# Patient Record
Sex: Male | Born: 1935 | Race: White | Hispanic: No | Marital: Married | Smoking: Never smoker
Health system: Southern US, Community
[De-identification: ages and names within clinical notes are randomized; demographics above are authoritative.]

## PROBLEM LIST (undated history)

## (undated) DIAGNOSIS — Z951 Presence of aortocoronary bypass graft: Secondary | ICD-10-CM

## (undated) DIAGNOSIS — E785 Hyperlipidemia, unspecified: Secondary | ICD-10-CM

## (undated) DIAGNOSIS — R001 Bradycardia, unspecified: Secondary | ICD-10-CM

## (undated) DIAGNOSIS — L309 Dermatitis, unspecified: Secondary | ICD-10-CM

## (undated) DIAGNOSIS — R634 Abnormal weight loss: Secondary | ICD-10-CM

## (undated) DIAGNOSIS — D649 Anemia, unspecified: Secondary | ICD-10-CM

## (undated) DIAGNOSIS — C61 Malignant neoplasm of prostate: Secondary | ICD-10-CM

## (undated) DIAGNOSIS — E039 Hypothyroidism, unspecified: Secondary | ICD-10-CM

## (undated) DIAGNOSIS — M1990 Unspecified osteoarthritis, unspecified site: Secondary | ICD-10-CM

## (undated) DIAGNOSIS — D7589 Other specified diseases of blood and blood-forming organs: Secondary | ICD-10-CM

## (undated) DIAGNOSIS — I2510 Atherosclerotic heart disease of native coronary artery without angina pectoris: Secondary | ICD-10-CM

## (undated) DIAGNOSIS — M8580 Other specified disorders of bone density and structure, unspecified site: Secondary | ICD-10-CM

## (undated) HISTORY — PX: APPENDECTOMY: SHX54

## (undated) HISTORY — DX: Presence of aortocoronary bypass graft: Z95.1

## (undated) HISTORY — DX: Hyperlipidemia, unspecified: E78.5

## (undated) HISTORY — DX: Unspecified osteoarthritis, unspecified site: M19.90

## (undated) HISTORY — PX: PROSTATECTOMY: SHX69

## (undated) HISTORY — DX: Abnormal weight loss: R63.4

## (undated) HISTORY — DX: Other specified disorders of bone density and structure, unspecified site: M85.80

## (undated) HISTORY — DX: Anemia, unspecified: D64.9

## (undated) HISTORY — DX: Atherosclerotic heart disease of native coronary artery without angina pectoris: I25.10

## (undated) HISTORY — DX: Other specified diseases of blood and blood-forming organs: D75.89

## (undated) HISTORY — DX: Malignant neoplasm of prostate: C61

## (undated) HISTORY — PX: HERNIA REPAIR: SHX51

## (undated) HISTORY — DX: Bradycardia, unspecified: R00.1

## (undated) HISTORY — DX: Dermatitis, unspecified: L30.9

## (undated) HISTORY — DX: Hypothyroidism, unspecified: E03.9

---

## 1980-08-28 HISTORY — PX: CHOLECYSTECTOMY: SHX55

## 1996-08-28 HISTORY — PX: CORONARY ARTERY BYPASS GRAFT: SHX141

## 1999-08-02 ENCOUNTER — Other Ambulatory Visit: Admission: RE | Admit: 1999-08-02 | Discharge: 1999-08-02 | Payer: Self-pay | Admitting: Urology

## 1999-08-17 ENCOUNTER — Inpatient Hospital Stay (HOSPITAL_COMMUNITY): Admission: RE | Admit: 1999-08-17 | Discharge: 1999-08-20 | Payer: Self-pay | Admitting: Urology

## 1999-08-17 ENCOUNTER — Encounter (INDEPENDENT_AMBULATORY_CARE_PROVIDER_SITE_OTHER): Payer: Self-pay

## 1999-08-30 ENCOUNTER — Ambulatory Visit (HOSPITAL_COMMUNITY): Admission: RE | Admit: 1999-08-30 | Discharge: 1999-08-30 | Payer: Self-pay | Admitting: Urology

## 2003-03-09 ENCOUNTER — Ambulatory Visit (HOSPITAL_COMMUNITY): Admission: RE | Admit: 2003-03-09 | Discharge: 2003-03-09 | Payer: Self-pay | Admitting: Gastroenterology

## 2004-12-28 ENCOUNTER — Ambulatory Visit: Payer: Self-pay | Admitting: Oncology

## 2005-01-27 ENCOUNTER — Encounter (INDEPENDENT_AMBULATORY_CARE_PROVIDER_SITE_OTHER): Payer: Self-pay | Admitting: *Deleted

## 2005-01-27 ENCOUNTER — Other Ambulatory Visit: Admission: RE | Admit: 2005-01-27 | Discharge: 2005-01-27 | Payer: Self-pay | Admitting: Oncology

## 2005-08-07 ENCOUNTER — Ambulatory Visit: Payer: Self-pay | Admitting: Oncology

## 2006-08-02 ENCOUNTER — Ambulatory Visit: Payer: Self-pay | Admitting: Oncology

## 2006-08-06 LAB — CBC WITH DIFFERENTIAL/PLATELET
BASO%: 0.5 % (ref 0.0–2.0)
Basophils Absolute: 0 10*3/uL (ref 0.0–0.1)
EOS%: 3.6 % (ref 0.0–7.0)
Eosinophils Absolute: 0.1 10*3/uL (ref 0.0–0.5)
HCT: 38.2 % — ABNORMAL LOW (ref 38.7–49.9)
HGB: 13.1 g/dL (ref 13.0–17.1)
LYMPH%: 34.5 % (ref 14.0–48.0)
MCH: 35.3 pg — ABNORMAL HIGH (ref 28.0–33.4)
MCHC: 34.2 g/dL (ref 32.0–35.9)
MCV: 103.1 fL — ABNORMAL HIGH (ref 81.6–98.0)
MONO#: 0.5 10*3/uL (ref 0.1–0.9)
MONO%: 10.9 % (ref 0.0–13.0)
NEUT#: 2.1 10*3/uL (ref 1.5–6.5)
NEUT%: 50.5 % (ref 40.0–75.0)
Platelets: 234 10*3/uL (ref 145–400)
RBC: 3.71 10*6/uL — ABNORMAL LOW (ref 4.20–5.71)
RDW: 12 % (ref 11.2–14.6)
WBC: 4.2 10*3/uL (ref 4.0–10.0)
lymph#: 1.4 10*3/uL (ref 0.9–3.3)

## 2007-08-01 ENCOUNTER — Ambulatory Visit: Payer: Self-pay | Admitting: Oncology

## 2007-08-05 LAB — CBC WITH DIFFERENTIAL/PLATELET
BASO%: 0.5 % (ref 0.0–2.0)
Basophils Absolute: 0 10*3/uL (ref 0.0–0.1)
EOS%: 2.7 % (ref 0.0–7.0)
Eosinophils Absolute: 0.1 10*3/uL (ref 0.0–0.5)
HCT: 36.8 % — ABNORMAL LOW (ref 38.7–49.9)
HGB: 13 g/dL (ref 13.0–17.1)
LYMPH%: 28.4 % (ref 14.0–48.0)
MCH: 35.6 pg — ABNORMAL HIGH (ref 28.0–33.4)
MCHC: 35.3 g/dL (ref 32.0–35.9)
MCV: 101.1 fL — ABNORMAL HIGH (ref 81.6–98.0)
MONO#: 0.5 10*3/uL (ref 0.1–0.9)
MONO%: 10 % (ref 0.0–13.0)
NEUT#: 3 10*3/uL (ref 1.5–6.5)
NEUT%: 58.4 % (ref 40.0–75.0)
Platelets: 214 10*3/uL (ref 145–400)
RBC: 3.64 10*6/uL — ABNORMAL LOW (ref 4.20–5.71)
RDW: 12.3 % (ref 11.2–14.6)
WBC: 5.1 10*3/uL (ref 4.0–10.0)
lymph#: 1.5 10*3/uL (ref 0.9–3.3)

## 2008-07-30 ENCOUNTER — Ambulatory Visit: Payer: Self-pay | Admitting: Oncology

## 2009-08-02 ENCOUNTER — Ambulatory Visit: Payer: Self-pay | Admitting: Oncology

## 2009-08-05 LAB — CBC WITH DIFFERENTIAL/PLATELET
BASO%: 0.7 % (ref 0.0–2.0)
Basophils Absolute: 0 10*3/uL (ref 0.0–0.1)
EOS%: 6 % (ref 0.0–7.0)
Eosinophils Absolute: 0.2 10*3/uL (ref 0.0–0.5)
HCT: 36.5 % — ABNORMAL LOW (ref 38.4–49.9)
HGB: 12.7 g/dL — ABNORMAL LOW (ref 13.0–17.1)
LYMPH%: 40 % (ref 14.0–49.0)
MCH: 37.1 pg — ABNORMAL HIGH (ref 27.2–33.4)
MCHC: 34.7 g/dL (ref 32.0–36.0)
MCV: 106.9 fL — ABNORMAL HIGH (ref 79.3–98.0)
MONO#: 0.4 10*3/uL (ref 0.1–0.9)
MONO%: 10.7 % (ref 0.0–14.0)
NEUT#: 1.6 10*3/uL (ref 1.5–6.5)
NEUT%: 42.6 % (ref 39.0–75.0)
Platelets: 178 10*3/uL (ref 140–400)
RBC: 3.42 10*6/uL — ABNORMAL LOW (ref 4.20–5.82)
RDW: 12.2 % (ref 11.0–14.6)
WBC: 3.8 10*3/uL — ABNORMAL LOW (ref 4.0–10.3)
lymph#: 1.5 10*3/uL (ref 0.9–3.3)

## 2010-08-03 ENCOUNTER — Ambulatory Visit: Payer: Self-pay | Admitting: Oncology

## 2010-08-05 LAB — CBC WITH DIFFERENTIAL/PLATELET
BASO%: 0.7 % (ref 0.0–2.0)
Basophils Absolute: 0 10*3/uL (ref 0.0–0.1)
EOS%: 2.9 % (ref 0.0–7.0)
Eosinophils Absolute: 0.1 10*3/uL (ref 0.0–0.5)
HCT: 39 % (ref 38.4–49.9)
HGB: 13.3 g/dL (ref 13.0–17.1)
LYMPH%: 42 % (ref 14.0–49.0)
MCH: 36 pg — ABNORMAL HIGH (ref 27.2–33.4)
MCHC: 34.1 g/dL (ref 32.0–36.0)
MCV: 105.7 fL — ABNORMAL HIGH (ref 79.3–98.0)
MONO#: 0.5 10*3/uL (ref 0.1–0.9)
MONO%: 11.2 % (ref 0.0–14.0)
NEUT#: 1.8 10*3/uL (ref 1.5–6.5)
NEUT%: 43.2 % (ref 39.0–75.0)
Platelets: 185 10*3/uL (ref 140–400)
RBC: 3.69 10*6/uL — ABNORMAL LOW (ref 4.20–5.82)
RDW: 12.2 % (ref 11.0–14.6)
WBC: 4.2 10*3/uL (ref 4.0–10.3)
lymph#: 1.8 10*3/uL (ref 0.9–3.3)

## 2011-01-13 NOTE — Op Note (Signed)
   NAME:  Joseph Werner, Joseph Werner                       ACCOUNT NO.:  [id number]   MEDICAL RECORD NO.:  [id number]                   PATIENT TYPE:  AMB   LOCATION:  ENDO                                 FACILITY:  [facility]   PHYSICIAN:  Petra Kuba, M.D.                 DATE OF BIRTH:  15-Aug-1935   DATE OF PROCEDURE:  03/09/2003  DATE OF DISCHARGE:                                 OPERATIVE REPORT   PROCEDURE PERFORMED:  Colonoscopy   INDICATIONS FOR PROCEDURE:  Anemia in a patient due to colonic screening.   INFORMED CONSENT:  Consent was signed after risks, benefits, methods and  options were thoroughly discussed in the office.   MEDICINES USED:  Demerol 40, Versed 4.   DESCRIPTION OF PROCEDURE:  Rectal inspection was pertinent for external  hemorrhoids, small.  Digital exam was negative.  Video pediatric adjustable  colonoscope was inserted and advanced from the colon to the cecum.  This did  require some abdominal pressure.  No position changes.  On insertion some  occasional left sided diverticula were seen but no other abnormalities.  The  cecum was identified by the appendiceal orifice and the ileocecal valve.  In  fact, the scope was inserted a short ways into the terminal ileum which was  normal.  Photo documentation was obtained.  The scope was slowly withdrawn.  Prep was adequate.  There was some liquid stool that required washing and  suctioning.  On slow withdraw through the colon, other than some occasional  left sided diverticula, no abnormalities were seen.  Once back in the  rectum, anorectal pull through and retroflexion confirmed some internal  hemorrhoids with some anal papillae.  Scope was straightened, air was  suctioned and the scope removed.  The patient tolerated the procedure well.  There was no obvious immediate complication.   ENDOSCOPIC DIAGNOSES:  1. Internal and external hemorrhoids with some anal papillae.  2. Occasional left sided diverticula.  3.  Otherwise within normal limits to the terminal ileum.   PLAN:  Consider repeat screening in five years.  Happy to see back p.r.n.,  otherwise return care to Dr. Clarene Duke for continued anemia workup to include  probable repeat CBC, consider B12 and folate levels and possibly even  hematology consult unless iron deficient, despite increased MCV and then  would check upper tract one time.                                               Petra Kuba, M.D.   MEM/MEDQ  D:  03/09/2003  T:  03/09/2003  Job:  [id number]   cc:   Caryn Bee L. Little, M.D.  [address]  [place]  [place] [address]  Fax: [phone]

## 2011-06-15 ENCOUNTER — Encounter (HOSPITAL_COMMUNITY): Payer: Medicare Other | Attending: Family Medicine

## 2011-06-15 DIAGNOSIS — M810 Age-related osteoporosis without current pathological fracture: Secondary | ICD-10-CM | POA: Insufficient documentation

## 2011-07-28 ENCOUNTER — Other Ambulatory Visit: Payer: Self-pay | Admitting: *Deleted

## 2011-07-29 ENCOUNTER — Telehealth: Payer: Self-pay | Admitting: Oncology

## 2011-07-29 NOTE — Telephone Encounter (Signed)
S/w the pt and he is aware of his r/s dec appts to [facility] 2013

## 2011-08-03 ENCOUNTER — Other Ambulatory Visit: Payer: Medicare Other | Admitting: Lab

## 2011-08-03 ENCOUNTER — Ambulatory Visit: Payer: Medicare Other | Admitting: Oncology

## 2011-09-07 ENCOUNTER — Other Ambulatory Visit: Payer: Self-pay | Admitting: *Deleted

## 2011-09-14 ENCOUNTER — Other Ambulatory Visit: Payer: Self-pay | Admitting: *Deleted

## 2011-09-14 ENCOUNTER — Encounter: Payer: Self-pay | Admitting: *Deleted

## 2011-09-14 DIAGNOSIS — D649 Anemia, unspecified: Secondary | ICD-10-CM

## 2011-09-15 ENCOUNTER — Ambulatory Visit (HOSPITAL_BASED_OUTPATIENT_CLINIC_OR_DEPARTMENT_OTHER): Payer: Medicare Other | Admitting: Nurse Practitioner

## 2011-09-15 ENCOUNTER — Telehealth: Payer: Self-pay | Admitting: Oncology

## 2011-09-15 ENCOUNTER — Other Ambulatory Visit (HOSPITAL_BASED_OUTPATIENT_CLINIC_OR_DEPARTMENT_OTHER): Payer: Medicare Other | Admitting: Lab

## 2011-09-15 VITALS — BP 134/73 | HR 42 | Temp 96.6°F | Ht 71.0 in | Wt 188.8 lb

## 2011-09-15 DIAGNOSIS — D7589 Other specified diseases of blood and blood-forming organs: Secondary | ICD-10-CM | POA: Diagnosis not present

## 2011-09-15 DIAGNOSIS — D72819 Decreased white blood cell count, unspecified: Secondary | ICD-10-CM | POA: Diagnosis not present

## 2011-09-15 LAB — CBC WITH DIFFERENTIAL/PLATELET
BASO%: 0.8 % (ref 0.0–2.0)
Basophils Absolute: 0 10*3/uL (ref 0.0–0.1)
EOS%: 3 % (ref 0.0–7.0)
Eosinophils Absolute: 0.1 10*3/uL (ref 0.0–0.5)
HCT: 39.3 % (ref 38.4–49.9)
HGB: 13.4 g/dL (ref 13.0–17.1)
LYMPH%: 35.6 % (ref 14.0–49.0)
MCH: 36.2 pg — ABNORMAL HIGH (ref 27.2–33.4)
MCHC: 34.3 g/dL (ref 32.0–36.0)
MCV: 105.6 fL — ABNORMAL HIGH (ref 79.3–98.0)
MONO#: 0.4 10*3/uL (ref 0.1–0.9)
MONO%: 10.7 % (ref 0.0–14.0)
NEUT#: 1.7 10*3/uL (ref 1.5–6.5)
NEUT%: 49.9 % (ref 39.0–75.0)
Platelets: 176 10*3/uL (ref 140–400)
RBC: 3.72 10*6/uL — ABNORMAL LOW (ref 4.20–5.82)
RDW: 12.1 % (ref 11.0–14.6)
WBC: 3.5 10*3/uL — ABNORMAL LOW (ref 4.0–10.3)
lymph#: 1.2 10*3/uL (ref 0.9–3.3)

## 2011-09-15 NOTE — Progress Notes (Signed)
OFFICE PROGRESS NOTE  Interval history:  [name] is a 77 year old man with chronic red cell macrocytosis. He is seen today for scheduled followup.  [name] reports that he feels well. No interim illnesses or infections. He has a good appetite and good energy level. No shortness of breath. No fevers or sweats.   Objective: Blood pressure 134/73, pulse 42, temperature 96.6 F (35.9 C), temperature source Oral, height 5\' 11"  (1.803 m), weight 188 lb 12.8 oz (85.639 kg).  Oropharynx is without thrush or ulceration. No palpable cervical, supraclavicular or axillary lymph nodes. Lungs are clear. No wheezes or rales. Regular cardiac rhythm. Abdomen is soft and nontender. No organomegaly. Extremities are without edema.  Lab Results: Lab Results  Component Value Date   WBC 3.5* 09/15/2011   HGB 13.4 09/15/2011   HCT 39.3 09/15/2011   MCV 105.6* 09/15/2011   PLT 176 09/15/2011    Chemistry:    Chemistry   No results found for this basename: NA, K, CL, CO2, BUN, CREATININE, GLU   No results found for this basename: CALCIUM, ALKPHOS, AST, ALT, BILITOT       Studies/Results: No results found.  Medications: I have reviewed the patient's current medications.  Assessment/Plan:  1. 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 A bone marrow biopsy in 2006 was nondiagnostic. 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 2. Chronic bradycardia. 3. Borderline low white count - stable.  Disposition-[name] appears stable from a hematologic standpoint. He will return for an office visit and CBC in one year. He will contact the office the interim with any problems.  Plan reviewed with Dr. Derenda Fennel, Misty Stanley ANP/GNP-BC

## 2011-09-15 NOTE — Telephone Encounter (Signed)
Gv pt appt for jan2014 °

## 2011-10-05 DIAGNOSIS — Z23 Encounter for immunization: Secondary | ICD-10-CM | POA: Diagnosis not present

## 2011-12-11 DIAGNOSIS — R05 Cough: Secondary | ICD-10-CM | POA: Diagnosis not present

## 2011-12-11 DIAGNOSIS — R059 Cough, unspecified: Secondary | ICD-10-CM | POA: Diagnosis not present

## 2011-12-11 DIAGNOSIS — J309 Allergic rhinitis, unspecified: Secondary | ICD-10-CM | POA: Diagnosis not present

## 2012-01-12 DIAGNOSIS — Z951 Presence of aortocoronary bypass graft: Secondary | ICD-10-CM | POA: Diagnosis not present

## 2012-01-12 DIAGNOSIS — E785 Hyperlipidemia, unspecified: Secondary | ICD-10-CM | POA: Diagnosis not present

## 2012-01-12 DIAGNOSIS — Z0000 Encounter for general adult medical examination without abnormal findings: Secondary | ICD-10-CM | POA: Diagnosis not present

## 2012-01-12 DIAGNOSIS — E039 Hypothyroidism, unspecified: Secondary | ICD-10-CM | POA: Diagnosis not present

## 2012-01-12 DIAGNOSIS — I2510 Atherosclerotic heart disease of native coronary artery without angina pectoris: Secondary | ICD-10-CM | POA: Diagnosis not present

## 2012-01-12 DIAGNOSIS — Z79899 Other long term (current) drug therapy: Secondary | ICD-10-CM | POA: Diagnosis not present

## 2012-02-14 DIAGNOSIS — Z951 Presence of aortocoronary bypass graft: Secondary | ICD-10-CM | POA: Diagnosis not present

## 2012-02-14 DIAGNOSIS — I2510 Atherosclerotic heart disease of native coronary artery without angina pectoris: Secondary | ICD-10-CM | POA: Diagnosis not present

## 2012-02-14 DIAGNOSIS — E7800 Pure hypercholesterolemia, unspecified: Secondary | ICD-10-CM | POA: Diagnosis not present

## 2012-04-18 DIAGNOSIS — Z79899 Other long term (current) drug therapy: Secondary | ICD-10-CM | POA: Diagnosis not present

## 2012-04-18 DIAGNOSIS — L2089 Other atopic dermatitis: Secondary | ICD-10-CM | POA: Diagnosis not present

## 2012-04-18 DIAGNOSIS — E039 Hypothyroidism, unspecified: Secondary | ICD-10-CM | POA: Diagnosis not present

## 2012-04-18 DIAGNOSIS — M899 Disorder of bone, unspecified: Secondary | ICD-10-CM | POA: Diagnosis not present

## 2012-04-18 DIAGNOSIS — E785 Hyperlipidemia, unspecified: Secondary | ICD-10-CM | POA: Diagnosis not present

## 2012-04-18 DIAGNOSIS — Z23 Encounter for immunization: Secondary | ICD-10-CM | POA: Diagnosis not present

## 2012-04-18 DIAGNOSIS — Z0000 Encounter for general adult medical examination without abnormal findings: Secondary | ICD-10-CM | POA: Diagnosis not present

## 2012-04-18 DIAGNOSIS — I2510 Atherosclerotic heart disease of native coronary artery without angina pectoris: Secondary | ICD-10-CM | POA: Diagnosis not present

## 2012-04-18 DIAGNOSIS — Z8546 Personal history of malignant neoplasm of prostate: Secondary | ICD-10-CM | POA: Diagnosis not present

## 2012-04-18 DIAGNOSIS — M949 Disorder of cartilage, unspecified: Secondary | ICD-10-CM | POA: Diagnosis not present

## 2012-05-03 DIAGNOSIS — M722 Plantar fascial fibromatosis: Secondary | ICD-10-CM | POA: Diagnosis not present

## 2012-05-03 DIAGNOSIS — M79609 Pain in unspecified limb: Secondary | ICD-10-CM | POA: Diagnosis not present

## 2012-05-09 DIAGNOSIS — Z23 Encounter for immunization: Secondary | ICD-10-CM | POA: Diagnosis not present

## 2012-05-17 DIAGNOSIS — M722 Plantar fascial fibromatosis: Secondary | ICD-10-CM | POA: Diagnosis not present

## 2012-06-06 DIAGNOSIS — M949 Disorder of cartilage, unspecified: Secondary | ICD-10-CM | POA: Diagnosis not present

## 2012-06-06 DIAGNOSIS — E785 Hyperlipidemia, unspecified: Secondary | ICD-10-CM | POA: Diagnosis not present

## 2012-06-06 DIAGNOSIS — Z0000 Encounter for general adult medical examination without abnormal findings: Secondary | ICD-10-CM | POA: Diagnosis not present

## 2012-06-06 DIAGNOSIS — I2510 Atherosclerotic heart disease of native coronary artery without angina pectoris: Secondary | ICD-10-CM | POA: Diagnosis not present

## 2012-06-06 DIAGNOSIS — M899 Disorder of bone, unspecified: Secondary | ICD-10-CM | POA: Diagnosis not present

## 2012-06-06 DIAGNOSIS — C61 Malignant neoplasm of prostate: Secondary | ICD-10-CM | POA: Diagnosis not present

## 2012-06-06 DIAGNOSIS — Z79899 Other long term (current) drug therapy: Secondary | ICD-10-CM | POA: Diagnosis not present

## 2012-06-06 DIAGNOSIS — E039 Hypothyroidism, unspecified: Secondary | ICD-10-CM | POA: Diagnosis not present

## 2012-06-19 ENCOUNTER — Encounter (HOSPITAL_COMMUNITY): Payer: Self-pay

## 2012-06-19 ENCOUNTER — Encounter (HOSPITAL_COMMUNITY)
Admission: RE | Admit: 2012-06-19 | Discharge: 2012-06-19 | Disposition: A | Discharge status: Home / Self Care | Payer: Medicare Other | Source: Ambulatory Visit | Attending: Family Medicine | Admitting: Family Medicine

## 2012-06-19 DIAGNOSIS — M810 Age-related osteoporosis without current pathological fracture: Secondary | ICD-10-CM | POA: Insufficient documentation

## 2012-06-19 MED ORDER — ZOLEDRONIC ACID 5 MG/100ML IV SOLN
5.0000 mg | Freq: Once | INTRAVENOUS | Status: AC
  Administered 2012-06-19: 5 mg via INTRAVENOUS
  Filled 2012-06-19: qty 100

## 2012-06-19 MED ORDER — SODIUM CHLORIDE 0.9 % IV SOLN
Freq: Once | INTRAVENOUS | Status: AC
  Administered 2012-06-19: 14:00:00 via INTRAVENOUS

## 2012-09-13 ENCOUNTER — Ambulatory Visit (HOSPITAL_BASED_OUTPATIENT_CLINIC_OR_DEPARTMENT_OTHER): Payer: Medicare Other | Admitting: Oncology

## 2012-09-13 ENCOUNTER — Telehealth: Payer: Self-pay | Admitting: Oncology

## 2012-09-13 ENCOUNTER — Other Ambulatory Visit (HOSPITAL_BASED_OUTPATIENT_CLINIC_OR_DEPARTMENT_OTHER): Payer: Medicare Other | Admitting: Lab

## 2012-09-13 VITALS — BP 140/67 | HR 46 | Temp 97.4°F | Resp 20 | Ht 72.0 in | Wt 190.5 lb

## 2012-09-13 DIAGNOSIS — D7589 Other specified diseases of blood and blood-forming organs: Secondary | ICD-10-CM | POA: Diagnosis not present

## 2012-09-13 DIAGNOSIS — D649 Anemia, unspecified: Secondary | ICD-10-CM

## 2012-09-13 LAB — CBC WITH DIFFERENTIAL/PLATELET
BASO%: 0.4 % (ref 0.0–2.0)
Basophils Absolute: 0 10*3/uL (ref 0.0–0.1)
EOS%: 2.3 % (ref 0.0–7.0)
Eosinophils Absolute: 0.1 10*3/uL (ref 0.0–0.5)
HCT: 36.4 % — ABNORMAL LOW (ref 38.4–49.9)
HGB: 12.8 g/dL — ABNORMAL LOW (ref 13.0–17.1)
LYMPH%: 27.1 % (ref 14.0–49.0)
MCH: 36.9 pg — ABNORMAL HIGH (ref 27.2–33.4)
MCHC: 35.3 g/dL (ref 32.0–36.0)
MCV: 104.5 fL — ABNORMAL HIGH (ref 79.3–98.0)
MONO#: 0.6 10*3/uL (ref 0.1–0.9)
MONO%: 10 % (ref 0.0–14.0)
NEUT#: 3.3 10*3/uL (ref 1.5–6.5)
NEUT%: 60.2 % (ref 39.0–75.0)
Platelets: 158 10*3/uL (ref 140–400)
RBC: 3.48 10*6/uL — ABNORMAL LOW (ref 4.20–5.82)
RDW: 12.2 % (ref 11.0–14.6)
WBC: 5.5 10*3/uL (ref 4.0–10.3)
lymph#: 1.5 10*3/uL (ref 0.9–3.3)

## 2012-09-13 NOTE — Telephone Encounter (Signed)
gv and printed appt schedule for pt for jan 2015...the patient aware and ok

## 2012-09-13 NOTE — Progress Notes (Signed)
    [facility]    OFFICE PROGRESS NOTE   INTERVAL HISTORY:   He returns as scheduled. He feels well. Good energy level. He exercises 5 days per week. He has noted discomfort in the low abdomen/suprapubic area when lifting weights. No pain at other times.  Objective:  Vital signs in last 24 hours:  Blood pressure 140/67, pulse 46, temperature 97.4 F (36.3 C), temperature source Oral, resp. rate 20, height 6' (1.829 m), weight 190 lb 8 oz (86.41 kg).    HEENT: Neck without mass Lymphatics: No cervical, supraclavicular, or inguinal nodes. "Shotty "bilateral axillary nodes. Resp: Lungs clear bilaterally Cardio: Regular rate and rhythm GI: No hepatosplenic, no mass, ? Mild fullness in the right low abdomen near the pelvic brim.? Reducible hernia Vascular: No leg edema   Lab Results:  Lab Results  Component Value Date   WBC 5.5 09/13/2012   HGB 12.8* 09/13/2012   HCT 36.4* 09/13/2012   MCV 104.5* 09/13/2012   PLT 158 09/13/2012   ANC 3.3    Medications: I have reviewed the patient's current medications.  Assessment/Plan: 1. 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 A bone marrow biopsy in 2006 was nondiagnostic. 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 2. Chronic bradycardia. 3. History of a Borderline low white count - normal today 4. Low abdominal discomfort when lifting weights-? Hernia. He will see Dr. Clarene Duke if the discomfort persists   Disposition:  Joseph Werner is stable from a hematologic standpoint. He would like to continue followup in the hematology clinic. He will return for an office visit and CBC in one year.   Thornton Papas, MD  09/13/2012  9:55 AM

## 2012-10-03 DIAGNOSIS — R109 Unspecified abdominal pain: Secondary | ICD-10-CM | POA: Diagnosis not present

## 2012-10-04 ENCOUNTER — Other Ambulatory Visit: Payer: Self-pay | Admitting: Family Medicine

## 2012-10-04 DIAGNOSIS — R102 Pelvic and perineal pain: Secondary | ICD-10-CM

## 2012-10-08 ENCOUNTER — Ambulatory Visit
Admission: RE | Admit: 2012-10-08 | Discharge: 2012-10-08 | Disposition: A | Discharge status: Home / Self Care | Payer: Medicare Other | Source: Ambulatory Visit | Attending: Family Medicine | Admitting: Family Medicine

## 2012-10-08 DIAGNOSIS — K4090 Unilateral inguinal hernia, without obstruction or gangrene, not specified as recurrent: Secondary | ICD-10-CM | POA: Diagnosis not present

## 2012-10-08 DIAGNOSIS — R102 Pelvic and perineal pain: Secondary | ICD-10-CM

## 2012-10-08 DIAGNOSIS — R599 Enlarged lymph nodes, unspecified: Secondary | ICD-10-CM | POA: Diagnosis not present

## 2012-10-08 IMAGING — CT CT PELVIS W/O CM
1 of 3 series · 15 of 36 positions shown, 19 images · IV contrast (READICAT)
Comparison: None.

CLINICAL DATA: Suprapubic pain for 1 month.  Equation all right
lower quadrant pain.  History of prostate cancer with
prostatectomy.

CT PELVIS WITHOUT CONTRAST
TECHNIQUE: Multidetector CT imaging of the pelvis was performed
following the standard protocol without intravenous contrast.

[Series 2: routine pelvis · axial · 0.70mm/px · z∈[-160,-0]mm · 15 of 37 slices shown, 19 images]
[im 3/37  soft-tissue]
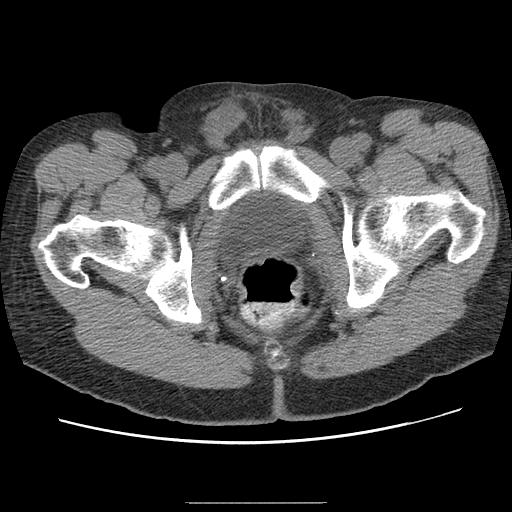
[im 3/37  bone]
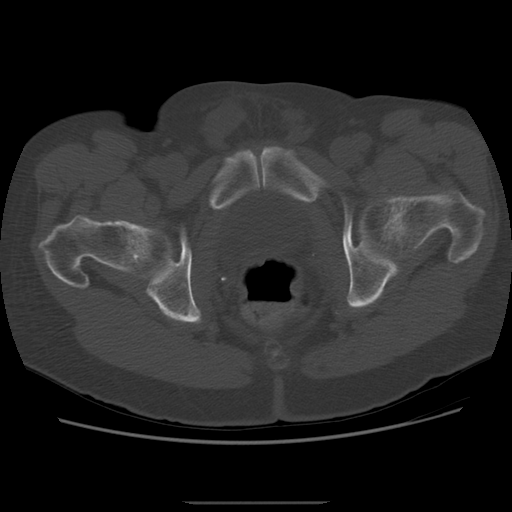
[im 5/37  soft-tissue]
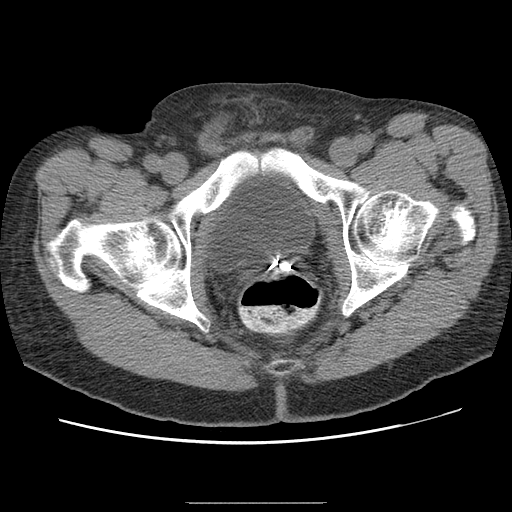
[im 7/37  soft-tissue]
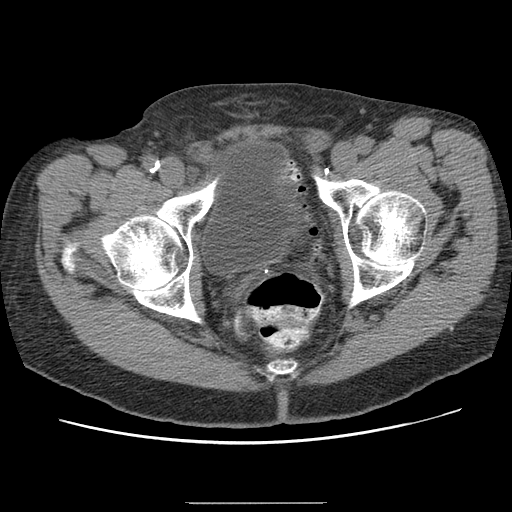
[im 11/37  soft-tissue]
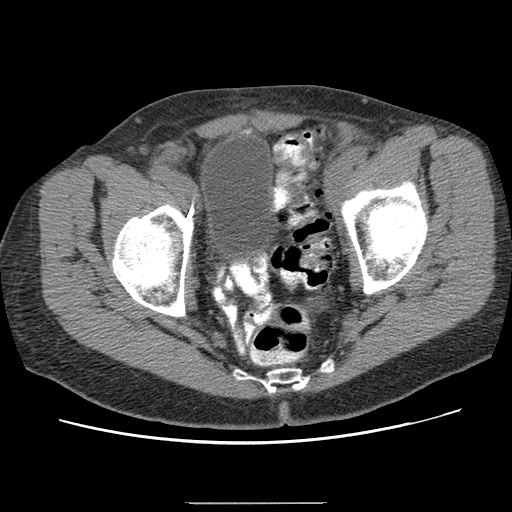
[im 13/37  soft-tissue]
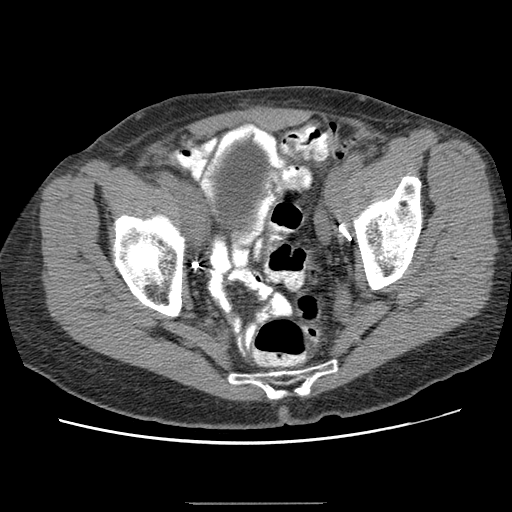
[im 16/37  soft-tissue]
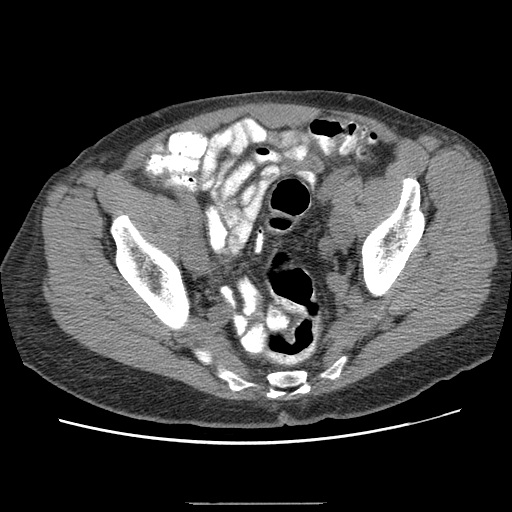
[im 19/37  soft-tissue]
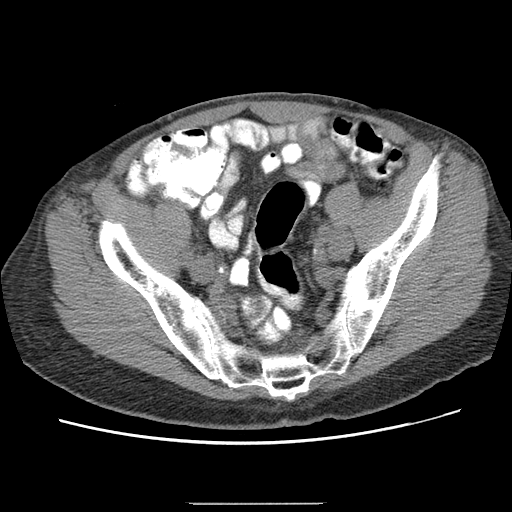
[im 21/37  soft-tissue]
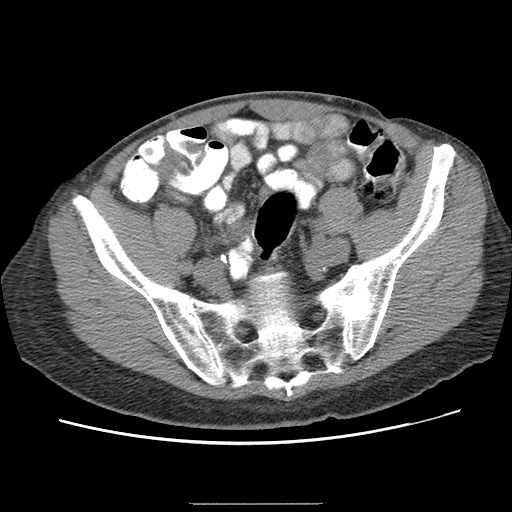
[im 24/37  soft-tissue]
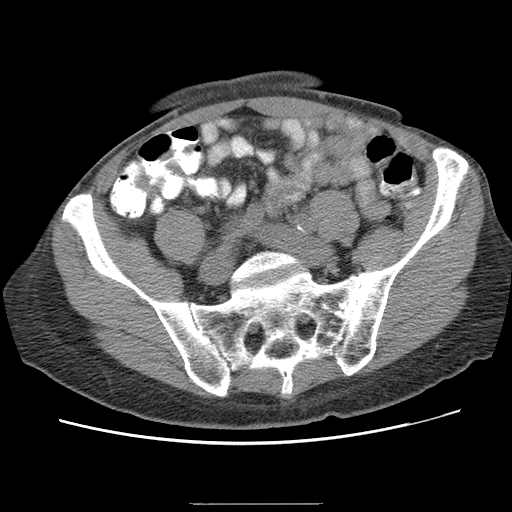
[im 24/37  bone]
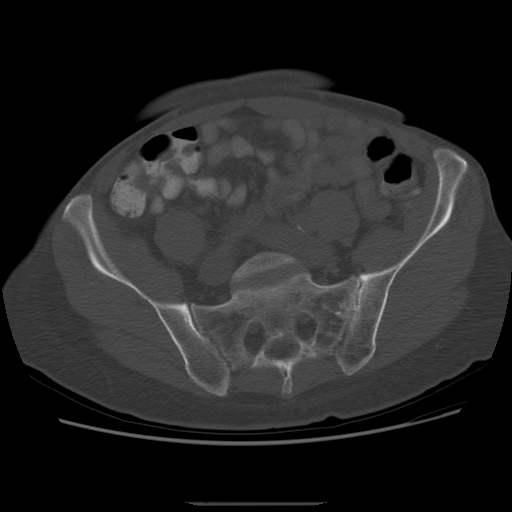
[im 26/37  soft-tissue]
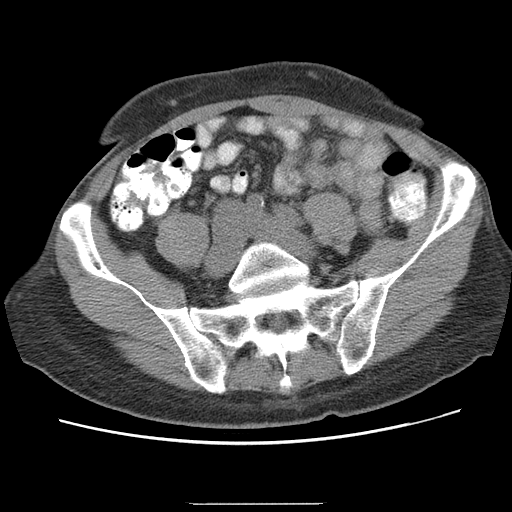
[im 30/37  soft-tissue]
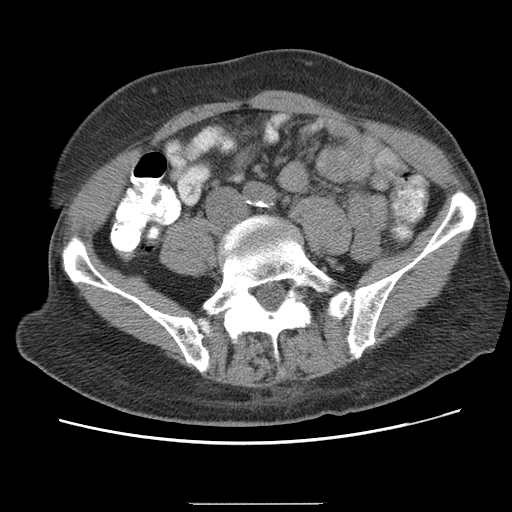
[im 32/37  soft-tissue]
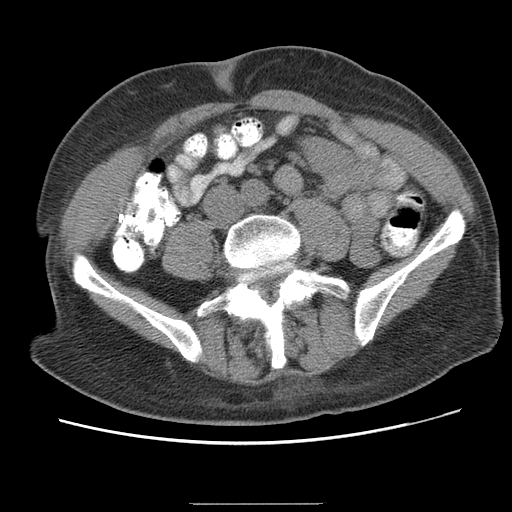
[im 32/37  lung]
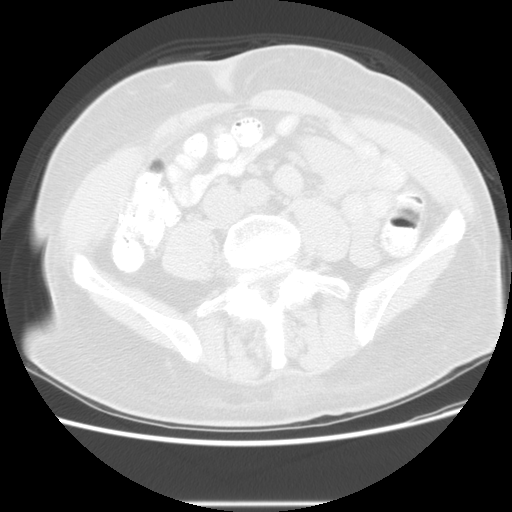
[im 33/37  lung]
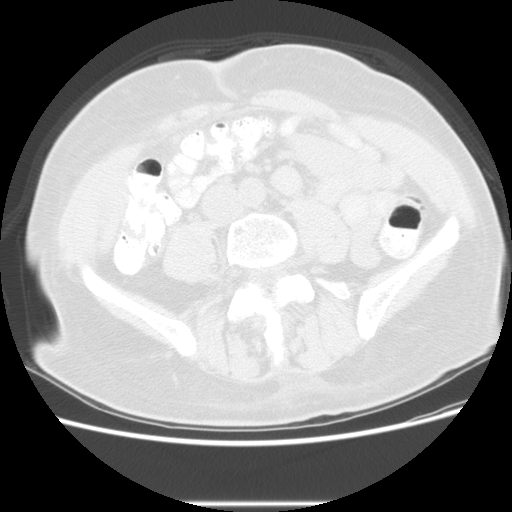
[im 34/37  soft-tissue]
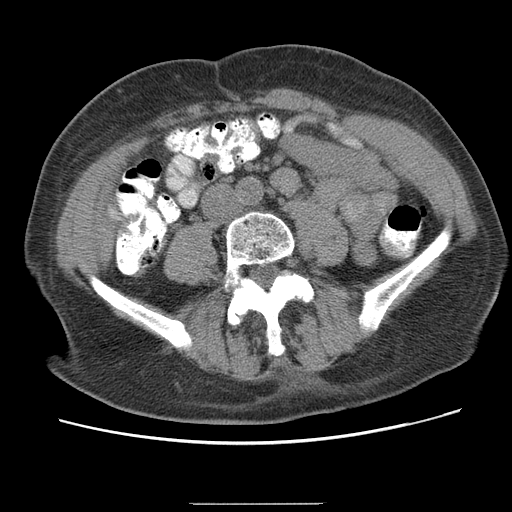
[im 34/37  lung]
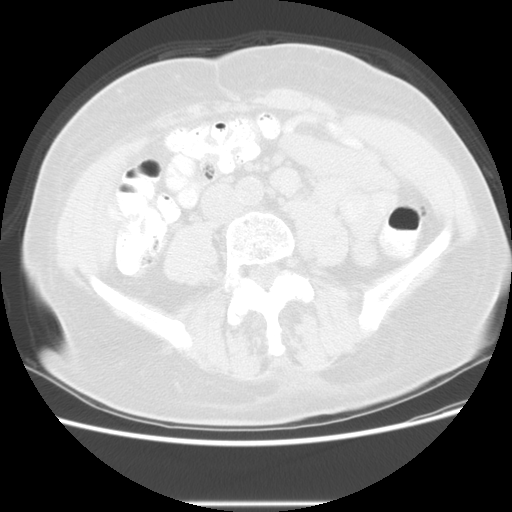
[im 35/37  lung]
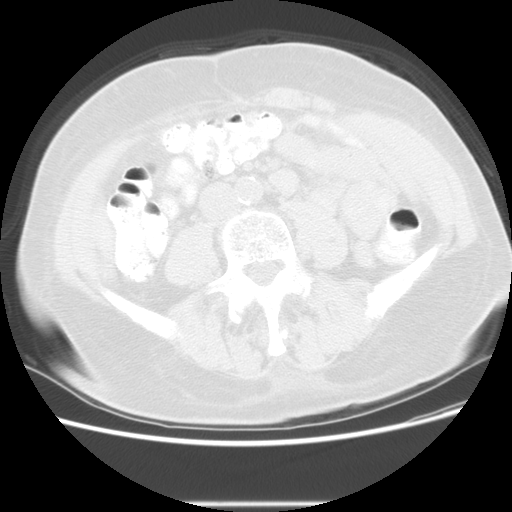

[15 of 36 positions shown; findings below may reference images not displayed]

FINDINGS: Oral contrast was used for the examination.  No IV
contrast is present.  Surgical clips along the pelvic side wall
compatible with pelvic lymphadenectomy.  Mild aortoiliac
atherosclerosis.  Small and large bowel appear within normal
limits.  Urinary bladder appears normal.  Postoperative changes of
prostatectomy.

There is fluid density in the inferior right inguinal canal.  This
probably represents cranial tracking of hydrocele in the right hemi
scrotum. Tiny fat containing right inguinal hernia is present.

No large hip effusion.  Hip girdle musculature appears within
normal limits.  Left inguinal canal appears normal.  No destructive
osseous lesions.  Partial ankylosis of the left sacroiliac joint,
probably degenerative.  The right SI joint remains open. Partially
visualized lumbar scoliosis and left greater than right L4-L5 and
right greater than left L5-S1 facet arthrosis.  Degenerative
changes in the spinous processes of L4 and L5 suggest Baastrup
impingement.
IMPRESSION: Tiny fat containing right inguinal hernia.  No herniated bowel.
Fluid in the right inguinal canal likely represent hydrocele
tracking cranially.  Postoperative changes of prostatectomy and
pelvic lymphadenectomy.

## 2012-10-18 ENCOUNTER — Encounter (INDEPENDENT_AMBULATORY_CARE_PROVIDER_SITE_OTHER): Payer: Self-pay

## 2012-10-21 ENCOUNTER — Ambulatory Visit (INDEPENDENT_AMBULATORY_CARE_PROVIDER_SITE_OTHER): Payer: Medicare Other | Admitting: General Surgery

## 2012-10-21 ENCOUNTER — Encounter (INDEPENDENT_AMBULATORY_CARE_PROVIDER_SITE_OTHER): Payer: Self-pay | Admitting: General Surgery

## 2012-10-21 VITALS — BP 146/96 | HR 60 | Temp 98.1°F | Resp 16 | Ht 71.0 in | Wt 192.6 lb

## 2012-10-21 DIAGNOSIS — K4090 Unilateral inguinal hernia, without obstruction or gangrene, not specified as recurrent: Secondary | ICD-10-CM | POA: Insufficient documentation

## 2012-10-21 NOTE — Patient Instructions (Signed)
Plan for right inguinal hernia repair with mesh 

## 2012-10-21 NOTE — Progress Notes (Signed)
Subjective:     Patient ID: Joseph Werner, male   DOB: [birth year], 78 y.o.   MRN: [id number]  HPI We are asked to see the patient in consultation by Dr. Clarene Duke to evaluate him for a right inguinal hernia. The patient is a 78 year old white male who works out and jogs on a treadmill everyday. Recently he's been experiencing some occasional pain in the right groin and suprapubic area. He denies any nausea or vomiting with it. His appetite has been good and his bowels work normally. He went to his medical doctor who ordered a CT of his pelvis. The CT did show a small right inguinal hernia. 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  Review of Systems  Constitutional: Negative.   HENT: Negative.   Eyes: Negative.   Respiratory: Negative.   Cardiovascular: Negative.   Gastrointestinal: Negative.   Endocrine: Negative.   Genitourinary: Negative.   Musculoskeletal: Negative.   Skin: Negative.   Allergic/Immunologic: Negative.   Neurological: Negative.   Hematological: Negative.   Psychiatric/Behavioral: Negative.        Objective:   Physical Exam  Constitutional: He is oriented to person, place, and time. He appears well-developed and well-nourished.  HENT:  Head: Normocephalic and atraumatic.  Eyes: Conjunctivae and EOM are normal. Pupils are equal, round, and reactive to light.  Neck: Normal range of motion. Neck supple.  Cardiovascular: Normal rate, regular rhythm and normal heart sounds.   Pulmonary/Chest: Effort normal and breath sounds normal.  Abdominal: Soft. Bowel sounds are normal.  Genitourinary:  There is no obvious bulge or impulse with straining in either groin.  Musculoskeletal: Normal range of motion.  Neurological: He is alert and oriented to person, place, and time.  Skin: Skin is warm and dry.  Psychiatric: He has a normal mood and affect. His behavior is normal.       Assessment:     The patient appears to  have a small but symptomatic right inguinal hernia. Because of the risk of incarceration strenuousness and could benefit from having this fixed. He would also like to have this done. I have discussed with him in detail the risks and benefits of the operation to fix the hernia as well as some of the technical aspects and he understands and wishes to proceed     Plan:     Plan for right inguinal hernia repair with mesh

## 2012-11-05 ENCOUNTER — Encounter (HOSPITAL_COMMUNITY): Payer: Self-pay | Admitting: Pharmacy Technician

## 2012-11-08 NOTE — Pre-Procedure Instructions (Signed)
KVEON CASANAS  11/08/2012   Your procedure is scheduled on:  Mon, Mar 24 @ 1:56 PM  Report to [facility] at 11:45 AM.  Call this number if you have problems the morning of surgery: [phone]   Remember:   Do not eat food or drink liquids after midnight.   Take these medicines the morning of surgery with A SIP OF WATER: Levothyroxine(Synthroid)              Stop taking your Aspirin,Fish Oil,Ibuprofen 5 days prior to surgery.No Goody's,BC's,Aleve,or Herbal Medications   Do not wear jewelry  Do not wear lotions, powders, or . You may wear deodorant.  Do not shave 48 hours prior to surgery. Men may shave face and neck.  Do not bring valuables to the hospital.  Contacts, dentures or bridgework may not be worn into surgery.  Leave suitcase in the car. After surgery it may be brought to your room.  For patients admitted to the hospital, checkout time is 11:00 AM the day of  discharge.   Patients discharged the day of surgery will not be allowed to drive  home.    Special Instructions: Shower using CHG 2 nights before surgery and the night before surgery.  If you shower the day of surgery use CHG.  Use special wash - you have one bottle of CHG for all showers.  You should use approximately 1/3 of the bottle for each shower.   Please read over the following fact sheets that you were given: Pain Booklet, Coughing and Deep Breathing, MRSA Information and Surgical Site Infection Prevention

## 2012-11-11 ENCOUNTER — Encounter (HOSPITAL_COMMUNITY)
Admission: RE | Admit: 2012-11-11 | Discharge: 2012-11-11 | Disposition: A | Discharge status: Home / Self Care | Payer: Medicare Other | Source: Ambulatory Visit | Attending: General Surgery | Admitting: General Surgery

## 2012-11-11 ENCOUNTER — Encounter (HOSPITAL_COMMUNITY): Payer: Self-pay

## 2012-11-11 ENCOUNTER — Ambulatory Visit (HOSPITAL_COMMUNITY)
Admission: RE | Admit: 2012-11-11 | Discharge: 2012-11-11 | Disposition: A | Discharge status: Home / Self Care | Payer: Medicare Other | Source: Ambulatory Visit | Attending: Anesthesiology | Admitting: Anesthesiology

## 2012-11-11 DIAGNOSIS — Z01812 Encounter for preprocedural laboratory examination: Secondary | ICD-10-CM | POA: Insufficient documentation

## 2012-11-11 DIAGNOSIS — Z01811 Encounter for preprocedural respiratory examination: Secondary | ICD-10-CM | POA: Diagnosis not present

## 2012-11-11 DIAGNOSIS — Z01818 Encounter for other preprocedural examination: Secondary | ICD-10-CM | POA: Insufficient documentation

## 2012-11-11 LAB — BASIC METABOLIC PANEL
BUN: 25 mg/dL — ABNORMAL HIGH (ref 6–23)
CO2: 29 mEq/L (ref 19–32)
Calcium: 9.6 mg/dL (ref 8.4–10.5)
Chloride: 108 mEq/L (ref 96–112)
Creatinine, Ser: 1.11 mg/dL (ref 0.50–1.35)
GFR calc Af Amer: 73 mL/min — ABNORMAL LOW (ref >90)
GFR calc non Af Amer: 63 mL/min — ABNORMAL LOW (ref >90)
Glucose, Bld: 95 mg/dL (ref 70–99)
Potassium: 4.4 mEq/L (ref 3.5–5.1)
Sodium: 143 mEq/L (ref 135–145)

## 2012-11-11 LAB — CBC
HCT: 41.2 % (ref 39.0–52.0)
Hemoglobin: 14.6 g/dL (ref 13.0–17.0)
MCH: 36 pg — ABNORMAL HIGH (ref 26.0–34.0)
MCHC: 35.4 g/dL (ref 30.0–36.0)
MCV: 101.5 fL — ABNORMAL HIGH (ref 78.0–100.0)
Platelets: 173 10*3/uL (ref 150–400)
RBC: 4.06 MIL/uL — ABNORMAL LOW (ref 4.22–5.81)
RDW: 11.3 % — ABNORMAL LOW (ref 11.5–15.5)
WBC: 9.6 10*3/uL (ref 4.0–10.5)

## 2012-11-11 LAB — SURGICAL PCR SCREEN
MRSA, PCR: NEGATIVE
Staphylococcus aureus: POSITIVE — AB

## 2012-11-11 IMAGING — CR DG CHEST 2V
2 series · 2 of 2 positions shown · non-contrast
Comparison: None.

CLINICAL DATA: Preadmission for hernia repair.

CHEST - 2 VIEW

[view not recorded (1 of 2)]
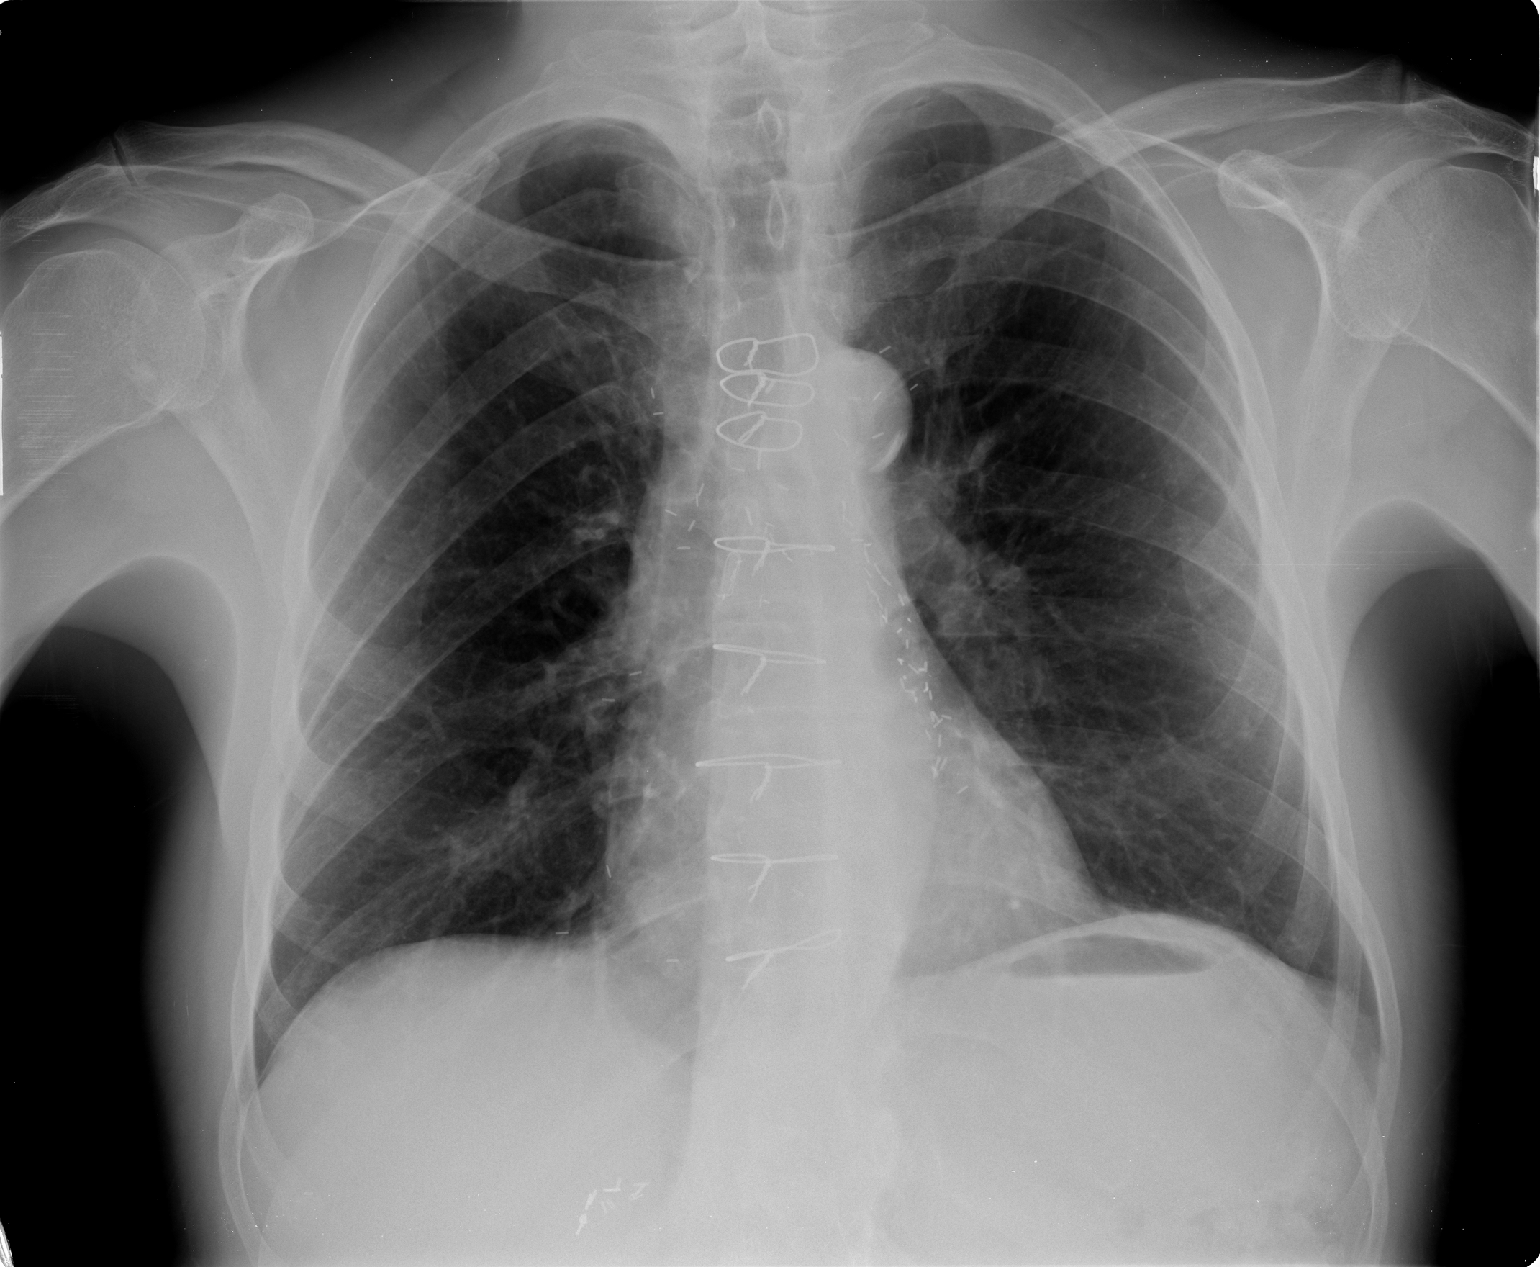

[view not recorded (2 of 2)]
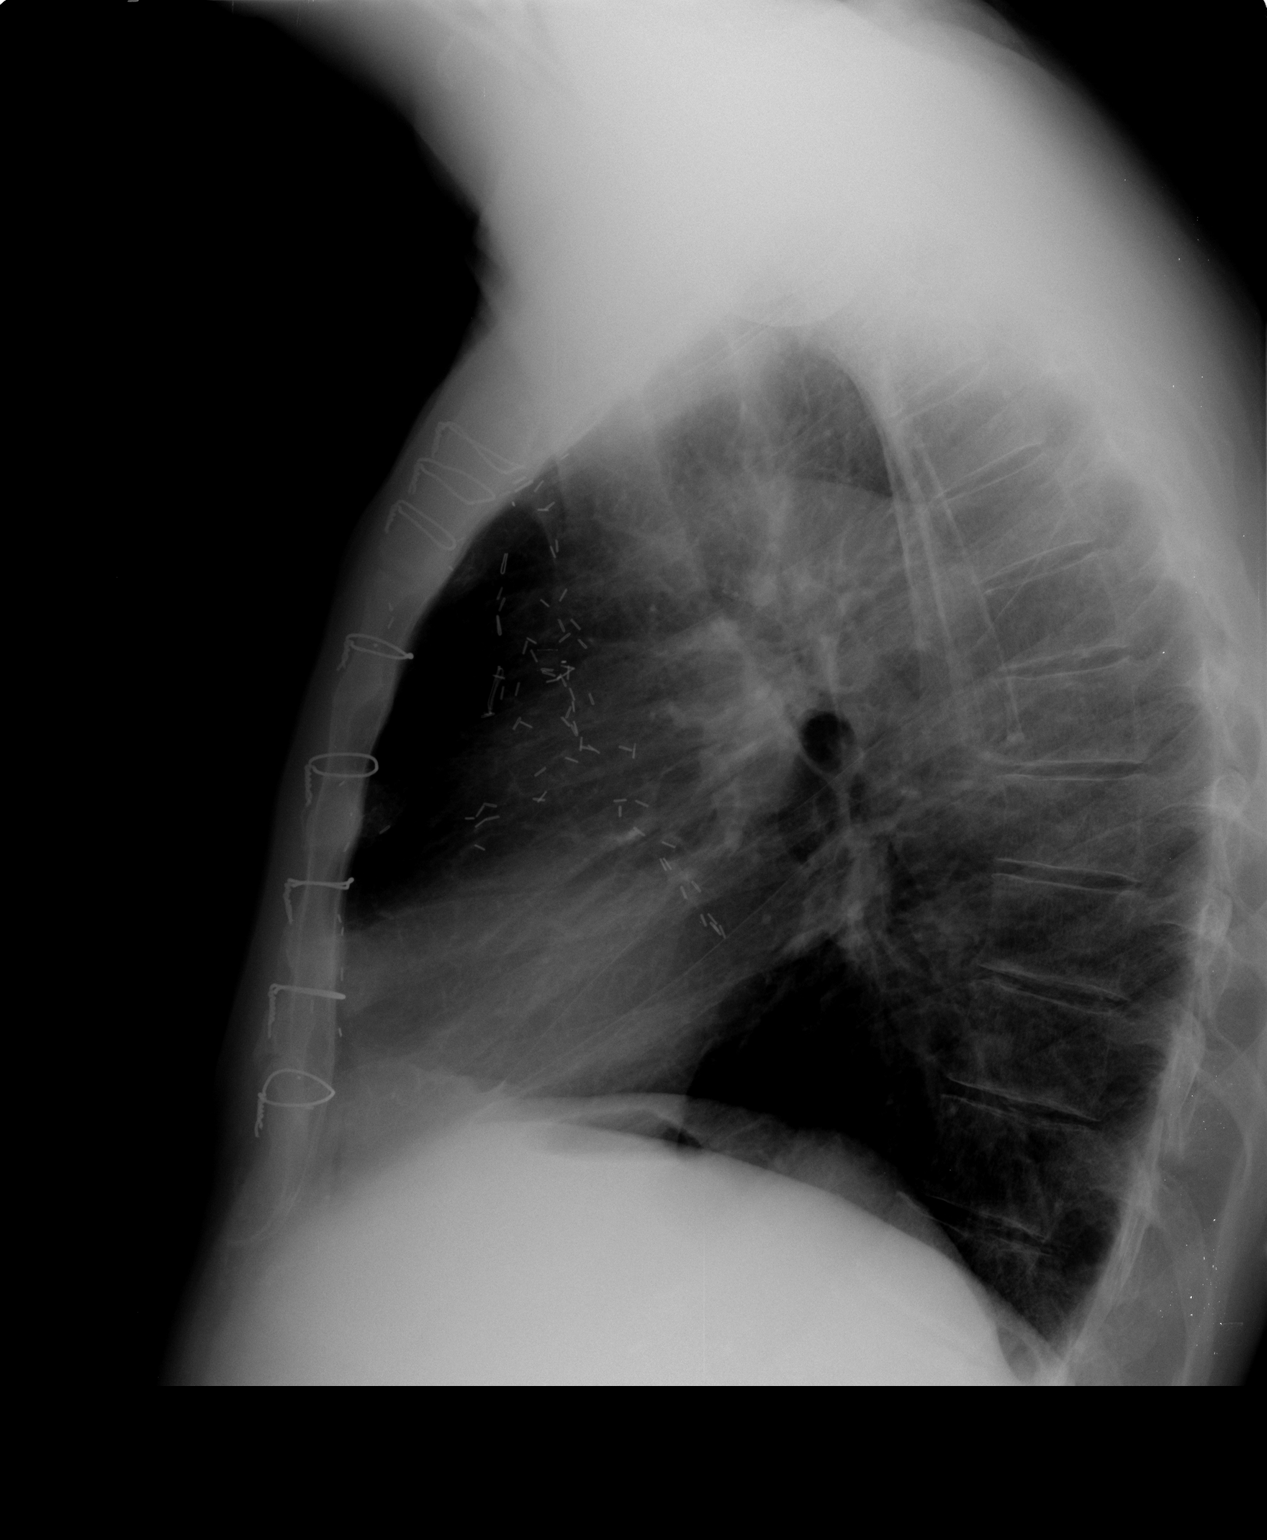

[2 of 2 positions shown; findings below may reference images not displayed]

FINDINGS: Two views of the chest demonstrate mild hyperinflation.
Median sternotomy wires and surgical clips consistent with previous
CABG procedure.  Lungs are clear bilaterally.  No evidence for
airspace disease or pulmonary edema. Heart and mediastinum are
within normal limits. Mild deformity of a lower thoracic vertebral
body.  This could be degenerative versus a small compression
fracture of unknown age.
IMPRESSION: No acute cardiopulmonary disease.

Degenerative change versus mild compression deformity in the lower
thoracic spine.

## 2012-11-17 MED ORDER — CEFAZOLIN SODIUM-DEXTROSE 2-3 GM-% IV SOLR
2.0000 g | INTRAVENOUS | Status: AC
  Filled 2012-11-17: qty 50

## 2012-11-18 ENCOUNTER — Encounter (HOSPITAL_COMMUNITY): Payer: Self-pay | Admitting: Anesthesiology

## 2012-11-18 ENCOUNTER — Ambulatory Visit (HOSPITAL_COMMUNITY)
Admission: RE | Admit: 2012-11-18 | Discharge: 2012-11-18 | Disposition: A | Discharge status: Home / Self Care | Payer: Medicare Other | Source: Ambulatory Visit | Attending: General Surgery | Admitting: General Surgery

## 2012-11-18 ENCOUNTER — Ambulatory Visit (HOSPITAL_COMMUNITY): Payer: Medicare Other | Admitting: Anesthesiology

## 2012-11-18 ENCOUNTER — Encounter (HOSPITAL_COMMUNITY)
Admission: RE | Disposition: A | Discharge status: Home / Self Care | Payer: Self-pay | Source: Ambulatory Visit | Attending: General Surgery

## 2012-11-18 DIAGNOSIS — I2510 Atherosclerotic heart disease of native coronary artery without angina pectoris: Secondary | ICD-10-CM | POA: Insufficient documentation

## 2012-11-18 DIAGNOSIS — K4090 Unilateral inguinal hernia, without obstruction or gangrene, not specified as recurrent: Secondary | ICD-10-CM

## 2012-11-18 DIAGNOSIS — C61 Malignant neoplasm of prostate: Secondary | ICD-10-CM | POA: Diagnosis not present

## 2012-11-18 HISTORY — PX: INSERTION OF MESH: SHX5868

## 2012-11-18 HISTORY — PX: INGUINAL HERNIA REPAIR: SHX194

## 2012-11-18 SURGERY — REPAIR, HERNIA, INGUINAL, ADULT
Anesthesia: General | Site: Groin | Laterality: Right | Wound class: Clean

## 2012-11-18 MED ORDER — GLYCOPYRROLATE 0.2 MG/ML IJ SOLN
INTRAMUSCULAR | Status: DC | PRN
  Administered 2012-11-18 (×2): 0.2 mg via INTRAVENOUS

## 2012-11-18 MED ORDER — PHENYLEPHRINE HCL 10 MG/ML IJ SOLN
INTRAMUSCULAR | Status: DC | PRN
  Administered 2012-11-18: 80 ug via INTRAVENOUS

## 2012-11-18 MED ORDER — ONDANSETRON HCL 4 MG/2ML IJ SOLN
INTRAMUSCULAR | Status: DC | PRN
  Administered 2012-11-18: 4 mg via INTRAVENOUS

## 2012-11-18 MED ORDER — ACETAMINOPHEN 10 MG/ML IV SOLN
INTRAVENOUS | Status: DC | PRN
  Administered 2012-11-18: 1000 mg via INTRAVENOUS

## 2012-11-18 MED ORDER — FENTANYL CITRATE 0.05 MG/ML IJ SOLN
INTRAMUSCULAR | Status: DC | PRN
  Administered 2012-11-18 (×3): 50 ug via INTRAVENOUS

## 2012-11-18 MED ORDER — LACTATED RINGERS IV SOLN
INTRAVENOUS | Status: DC | PRN
  Administered 2012-11-18 (×2): via INTRAVENOUS

## 2012-11-18 MED ORDER — 0.9 % SODIUM CHLORIDE (POUR BTL) OPTIME
TOPICAL | Status: DC | PRN
  Administered 2012-11-18: 1000 mL

## 2012-11-18 MED ORDER — ARTIFICIAL TEARS OP OINT
TOPICAL_OINTMENT | OPHTHALMIC | Status: DC | PRN
  Administered 2012-11-18: 1 via OPHTHALMIC

## 2012-11-18 MED ORDER — ACETAMINOPHEN 10 MG/ML IV SOLN
INTRAVENOUS | Status: AC
  Filled 2012-11-18: qty 100

## 2012-11-18 MED ORDER — HYDROCODONE-ACETAMINOPHEN 5-325 MG PO TABS: 1.0000 | ORAL_TABLET | ORAL | Status: DC | PRN

## 2012-11-18 MED ORDER — CHLORHEXIDINE GLUCONATE 4 % EX LIQD: 1.0000 "application " | Freq: Once | CUTANEOUS | Status: DC

## 2012-11-18 MED ORDER — LIDOCAINE HCL (CARDIAC) 20 MG/ML IV SOLN
INTRAVENOUS | Status: DC | PRN
  Administered 2012-11-18: 100 mg via INTRAVENOUS

## 2012-11-18 MED ORDER — HYDROMORPHONE HCL PF 1 MG/ML IJ SOLN
INTRAMUSCULAR | Status: AC
  Filled 2012-11-18: qty 1

## 2012-11-18 MED ORDER — LACTATED RINGERS IV SOLN
INTRAVENOUS | Status: DC
  Administered 2012-11-18: 13:00:00 via INTRAVENOUS

## 2012-11-18 MED ORDER — MIDAZOLAM HCL 5 MG/5ML IJ SOLN
INTRAMUSCULAR | Status: DC | PRN
  Administered 2012-11-18: 1 mg via INTRAVENOUS

## 2012-11-18 MED ORDER — PROPOFOL 10 MG/ML IV BOLUS
INTRAVENOUS | Status: DC | PRN
  Administered 2012-11-18: 170 mg via INTRAVENOUS

## 2012-11-18 MED ORDER — HYDROMORPHONE HCL PF 1 MG/ML IJ SOLN
0.2500 mg | INTRAMUSCULAR | Status: DC | PRN
  Administered 2012-11-18: 0.25 mg via INTRAVENOUS

## 2012-11-18 MED ORDER — BUPIVACAINE-EPINEPHRINE 0.25% -1:200000 IJ SOLN
INTRAMUSCULAR | Status: DC | PRN
  Administered 2012-11-18: 28 mL

## 2012-11-18 MED ORDER — CEFAZOLIN SODIUM-DEXTROSE 2-3 GM-% IV SOLR
INTRAVENOUS | Status: AC
  Filled 2012-11-18: qty 50

## 2012-11-18 MED ORDER — BUPIVACAINE-EPINEPHRINE PF 0.25-1:200000 % IJ SOLN
INTRAMUSCULAR | Status: AC
  Filled 2012-11-18: qty 30

## 2012-11-18 SURGICAL SUPPLY — 45 items
BLADE SURG 10 STRL SS (BLADE) ×2 IMPLANT
BLADE SURG 15 STRL LF DISP TIS (BLADE) ×1 IMPLANT
BLADE SURG 15 STRL SS (BLADE) ×2
BLADE SURG ROTATE 9660 (MISCELLANEOUS) ×2 IMPLANT
CHLORAPREP W/TINT 26ML (MISCELLANEOUS) ×2 IMPLANT
CLOTH BEACON ORANGE TIMEOUT ST (SAFETY) ×2 IMPLANT
COVER SURGICAL LIGHT HANDLE (MISCELLANEOUS) ×2 IMPLANT
DECANTER SPIKE VIAL GLASS SM (MISCELLANEOUS) ×2 IMPLANT
DERMABOND ADVANCED (GAUZE/BANDAGES/DRESSINGS) ×1
DERMABOND ADVANCED .7 DNX12 (GAUZE/BANDAGES/DRESSINGS) ×1 IMPLANT
DRAIN PENROSE 1/2X12 LTX STRL (WOUND CARE) ×2 IMPLANT
DRAPE LAPAROSCOPIC ABDOMINAL (DRAPES) ×2 IMPLANT
DRAPE UTILITY 15X26 W/TAPE STR (DRAPE) ×4 IMPLANT
ELECT CAUTERY BLADE 6.4 (BLADE) ×2 IMPLANT
ELECT REM PT RETURN 9FT ADLT (ELECTROSURGICAL) ×2
ELECTRODE REM PT RTRN 9FT ADLT (ELECTROSURGICAL) ×1 IMPLANT
GLOVE BIO SURGEON STRL SZ7.5 (GLOVE) ×2 IMPLANT
GLOVE BIOGEL PI IND STRL 7.0 (GLOVE) ×1 IMPLANT
GLOVE BIOGEL PI INDICATOR 7.0 (GLOVE) ×1
GLOVE ECLIPSE 7.0 STRL STRAW (GLOVE) ×2 IMPLANT
GLOVE SURG SS PI 7.0 STRL IVOR (GLOVE) ×2 IMPLANT
GOWN STRL NON-REIN LRG LVL3 (GOWN DISPOSABLE) ×6 IMPLANT
KIT BASIN OR (CUSTOM PROCEDURE TRAY) ×2 IMPLANT
KIT ROOM TURNOVER OR (KITS) ×2 IMPLANT
MESH ULTRAPRO 3X6 7.6X15CM (Mesh General) ×2 IMPLANT
NEEDLE HYPO 25GX1X1/2 BEV (NEEDLE) ×2 IMPLANT
NS IRRIG 1000ML POUR BTL (IV SOLUTION) ×2 IMPLANT
PACK SURGICAL SETUP 50X90 (CUSTOM PROCEDURE TRAY) ×2 IMPLANT
PAD ARMBOARD 7.5X6 YLW CONV (MISCELLANEOUS) ×4 IMPLANT
PENCIL BUTTON HOLSTER BLD 10FT (ELECTRODE) ×2 IMPLANT
SPONGE LAP 18X18 X RAY DECT (DISPOSABLE) ×2 IMPLANT
SUT MNCRL AB 4-0 PS2 18 (SUTURE) ×2 IMPLANT
SUT PROLENE 2 0 SH DA (SUTURE) ×4 IMPLANT
SUT SILK 3 0 (SUTURE) ×2
SUT SILK 3-0 18XBRD TIE 12 (SUTURE) ×1 IMPLANT
SUT VIC AB 0 CT1 27 (SUTURE) ×2
SUT VIC AB 0 CT1 27XBRD ANBCTR (SUTURE) ×1 IMPLANT
SUT VIC AB 2-0 SH 27 (SUTURE) ×1
SUT VIC AB 2-0 SH 27X BRD (SUTURE) ×1 IMPLANT
SUT VIC AB 3-0 SH 27 (SUTURE) ×1
SUT VIC AB 3-0 SH 27XBRD (SUTURE) ×1 IMPLANT
SYR BULB 3OZ (MISCELLANEOUS) ×2 IMPLANT
SYR CONTROL 10ML LL (SYRINGE) ×2 IMPLANT
TOWEL OR 17X24 6PK STRL BLUE (TOWEL DISPOSABLE) ×2 IMPLANT
TOWEL OR 17X26 10 PK STRL BLUE (TOWEL DISPOSABLE) ×2 IMPLANT

## 2012-11-18 NOTE — Progress Notes (Signed)
Dr Randa Evens called for medication orders

## 2012-11-18 NOTE — Op Note (Signed)
11/18/2012  3:18 PM  PATIENT:  Joseph Werner  78 y.o. male  PRE-OPERATIVE DIAGNOSIS:  Right inguinal hernia   POST-OPERATIVE DIAGNOSIS:  Right inguinal hernia   PROCEDURE:  Procedure(s): HERNIA REPAIR INGUINAL ADULT (Right) INSERTION OF MESH (Right)  SURGEON:  Surgeon(s) and Role:    * Robyne Askew, MD - Primary  PHYSICIAN ASSISTANT:   ASSISTANTS: none   ANESTHESIA:   general  EBL:  Total I/O In: 1200 [I.V.:1200] Out: 10 [Blood:10]  BLOOD ADMINISTERED:none  DRAINS: none   LOCAL MEDICATIONS USED:  MARCAINE     SPECIMEN:  No Specimen  DISPOSITION OF SPECIMEN:  N/A  COUNTS:  YES  TOURNIQUET:  * No tourniquets in log *  DICTATION: .Dragon Dictation After informed consent was obtained the patient was brought to the operating room and placed in the supine position on the operating room table. After adequate induction of general anesthesia the patient's abdomen and right groin were prepped with ChloraPrep, allowed to dry, and draped in the usual sterile manner. The right groin area was then infiltrated with quarter percent Marcaine with epinephrine. A small incision was made from the edge of the pubic tubercle on the right towards the anterior superior iliac spine. This incision was carried through the skin and subcutaneous tissue sharply with electrocautery until the fascia of the external oblique was encountered. A small bridging vein was clamped with hemostats divided and ligated with 3-0 silk ties. The external oblique fascia was opened along its fibers towards the apex of the external ring with a 15 blade knife and Metzenbaum scissors. A [place] retractor was deployed. Blunt dissection was carried out up the cord structures until it could be surrounded between 2 fingers. A half-inch Penrose drain was placed around the cord structures for traction purposes. The cord structures were gently skeletonized by blunt hemostat dissection. A small hernia sac was identified with  the cord. It was gently separated from the rest of the cord structures. The sac was so small that it did not require opening or ligation. The hernia sac was able to be bluntly reduced back underneath the transversalis muscle. The floor of the canal was then repaired with a figure-of-eight 0 Vicryl stitch. A 3 x 6 piece of ultra Pro mesh was then chosen and cut to fit. The mesh was sewed inferiorly to the shelving edge of the inguinal ligament with a running 2-0 Prolene stitch. Tails were cut laterally in the mesh in the tails were wrapped around the cord structures. Superiorly the mesh was sewn to the muscular aponeurotic strength layer of the transversalis with interrupted 2-0 Prolene vertical mattress stitches. Lateral to the cord the tails of the mesh were anchored to the shelving edge of the inguinal ligament with an interrupted 2-0 Prolene stitch. Once this was accomplished the mesh appeared to be in good position without any tension. The hernia appeared to be well repaired. The wound was then irrigated with copious amounts of saline. During the dissection the ilioinguinal nerve was identified and involved with some scar tissue. The nerve was dissected free proximally and distally clamped with hemostats and divided and ligated with 3-0 silk ties. Next the external oblique fascia was reapproximated with a running 2-0 Vicryl stitch. The wound was then infiltrated with 1/4% Marcaine. The subcutaneous fascia was then closed with a running 3-0 Vicryl stitch. The skin was then closed with a running 4-0 Monocryl subcuticular stitch. Dermabond dressings were applied. The patient tolerated the procedure well. At the end  of the case all needle sponge and instrument counts were correct. The patient was then awakened and taken to recovery in stable condition. The patient's testicle was in the scrotum at the end of the case.  PLAN OF CARE: Discharge to home after PACU  PATIENT DISPOSITION:  PACU - hemodynamically  stable.   Delay start of Pharmacological VTE agent (>24hrs) due to surgical blood loss or risk of bleeding: not applicable

## 2012-11-18 NOTE — Anesthesia Postprocedure Evaluation (Signed)
  Anesthesia Post-op Note  Patient: Joseph Werner  Procedure(s) Performed: Procedure(s): HERNIA REPAIR INGUINAL ADULT (Right) INSERTION OF MESH (Right)  Patient Location: PACU  Anesthesia Type:General  Level of Consciousness: awake  Airway and Oxygen Therapy: Patient Spontanous Breathing  Post-op Pain: mild  Post-op Assessment: Post-op Vital signs reviewed  Post-op Vital Signs: Reviewed  Complications: No apparent anesthesia complications

## 2012-11-18 NOTE — Anesthesia Preprocedure Evaluation (Addendum)
Anesthesia Evaluation  Patient identified by MRN, date of birth, ID band Patient awake    Reviewed: Allergy & Precautions, H&P , NPO status , Patient's Chart, lab work & pertinent test results  Airway Mallampati: II      Dental   Pulmonary neg pulmonary ROS,  breath sounds clear to auscultation        Cardiovascular + CAD Rhythm:Regular Rate:Normal     Neuro/Psych negative neurological ROS     GI/Hepatic negative GI ROS, Neg liver ROS,   Endo/Other    Renal/GU negative Renal ROS     Musculoskeletal   Abdominal   Peds  Hematology   Anesthesia Other Findings   Reproductive/Obstetrics                           Anesthesia Physical Anesthesia Plan  ASA: III  Anesthesia Plan: General   Post-op Pain Management:    Induction: Intravenous  Airway Management Planned: LMA  Additional Equipment:   Intra-op Plan:   Post-operative Plan: Extubation in OR  Informed Consent: I have reviewed the patients History and Physical, chart, labs and discussed the procedure including the risks, benefits and alternatives for the proposed anesthesia with the patient or authorized representative who has indicated his/her understanding and acceptance.   Dental advisory given  Plan Discussed with: Anesthesiologist, Surgeon and CRNA  Anesthesia Plan Comments:         Anesthesia Quick Evaluation

## 2012-11-18 NOTE — H&P (View-Only) (Signed)
Subjective:     Patient ID: Joseph Werner, male   DOB: 05-Apr-1935, 78 y.o.   MRN: [id number]  HPI We are asked to see the patient in consultation by Dr. Clarene Duke to evaluate him for a right inguinal hernia. The patient is a 78 year old white male who works out and jogs on a treadmill everyday. Recently he's been experiencing some occasional pain in the right groin and suprapubic area. He denies any nausea or vomiting with it. His appetite has been good and his bowels work normally. He went to his medical doctor who ordered a CT of his pelvis. The CT did show a small right inguinal hernia. 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  Review of Systems  Constitutional: Negative.   HENT: Negative.   Eyes: Negative.   Respiratory: Negative.   Cardiovascular: Negative.   Gastrointestinal: Negative.   Endocrine: Negative.   Genitourinary: Negative.   Musculoskeletal: Negative.   Skin: Negative.   Allergic/Immunologic: Negative.   Neurological: Negative.   Hematological: Negative.   Psychiatric/Behavioral: Negative.        Objective:   Physical Exam  Constitutional: He is oriented to person, place, and time. He appears well-developed and well-nourished.  HENT:  Head: Normocephalic and atraumatic.  Eyes: Conjunctivae and EOM are normal. Pupils are equal, round, and reactive to light.  Neck: Normal range of motion. Neck supple.  Cardiovascular: Normal rate, regular rhythm and normal heart sounds.   Pulmonary/Chest: Effort normal and breath sounds normal.  Abdominal: Soft. Bowel sounds are normal.  Genitourinary:  There is no obvious bulge or impulse with straining in either groin.  Musculoskeletal: Normal range of motion.  Neurological: He is alert and oriented to person, place, and time.  Skin: Skin is warm and dry.  Psychiatric: He has a normal mood and affect. His behavior is normal.       Assessment:     The patient appears to  have a small but symptomatic right inguinal hernia. Because of the risk of incarceration strenuousness and could benefit from having this fixed. He would also like to have this done. I have discussed with him in detail the risks and benefits of the operation to fix the hernia as well as some of the technical aspects and he understands and wishes to proceed     Plan:     Plan for right inguinal hernia repair with mesh

## 2012-11-18 NOTE — Transfer of Care (Signed)
Immediate Anesthesia Transfer of Care Note  Patient: Joseph Werner  Procedure(s) Performed: Procedure(s): HERNIA REPAIR INGUINAL ADULT (Right) INSERTION OF MESH (Right)  Patient Location: PACU  Anesthesia Type:General  Level of Consciousness: awake, alert  and oriented  Airway & Oxygen Therapy: Patient Spontanous Breathing and Patient connected to face mask oxygen  Post-op Assessment: Report given to PACU RN  Post vital signs: Reviewed and stable  Complications: No apparent anesthesia complications

## 2012-11-18 NOTE — Preoperative (Signed)
Beta Blockers   Reason not to administer Beta Blockers:Not Applicable 

## 2012-11-18 NOTE — Interval H&P Note (Signed)
History and Physical Interval Note:  11/18/2012 1:33 PM  Joseph Werner  has presented today for surgery, with the diagnosis of right inguinal hernia   The various methods of treatment have been discussed with the patient and family. After consideration of risks, benefits and other options for treatment, the patient has consented to  Procedure(s): HERNIA REPAIR INGUINAL ADULT (Right) INSERTION OF MESH (Right) as a surgical intervention .  The patient's history has been reviewed, patient examined, no change in status, stable for surgery.  I have reviewed the patient's chart and labs.  Questions were answered to the patient's satisfaction.     TOTH III,[name] S

## 2012-11-19 ENCOUNTER — Encounter (HOSPITAL_COMMUNITY): Payer: Self-pay | Admitting: General Surgery

## 2012-12-02 ENCOUNTER — Ambulatory Visit (INDEPENDENT_AMBULATORY_CARE_PROVIDER_SITE_OTHER): Payer: Medicare Other | Admitting: General Surgery

## 2012-12-02 ENCOUNTER — Encounter (INDEPENDENT_AMBULATORY_CARE_PROVIDER_SITE_OTHER): Payer: Self-pay | Admitting: General Surgery

## 2012-12-02 VITALS — BP 122/78 | HR 58 | Temp 99.6°F | Resp 18 | Ht 72.0 in | Wt 192.8 lb

## 2012-12-02 DIAGNOSIS — K4090 Unilateral inguinal hernia, without obstruction or gangrene, not specified as recurrent: Secondary | ICD-10-CM

## 2012-12-02 NOTE — Progress Notes (Signed)
Subjective:     Patient ID: Joseph Werner, male   DOB: 1934-09-23, 78 y.o.   MRN: [id number]  HPI The patient is a 78 year old white male who is 2 weeks status post right inguinal hernia repair with mesh. He tolerated the surgery well. He complains only of some minor soreness. He is walking on a treadmill everyday and tolerating this well. His only concern is of some swelling around the right testicle.  Review of Systems     Objective:   Physical Exam On exam his abdominal wall feels very solid with no palpable evidence of recurrence of the hernia. His right groin incision is healing nicely with no sign of infection or seroma. He has some mild swelling around the right testicle but the testicles nontender    Assessment:     The patient is 2 weeks status post right inguinal hernia repair with mesh     Plan:     At this point I would like him to continue to refrain from any heavy lifting. We will plan to see him back in one month to check his progress.

## 2012-12-02 NOTE — Patient Instructions (Signed)
No heavy lifting 

## 2013-01-06 ENCOUNTER — Encounter (INDEPENDENT_AMBULATORY_CARE_PROVIDER_SITE_OTHER): Payer: Self-pay | Admitting: General Surgery

## 2013-01-06 ENCOUNTER — Ambulatory Visit (INDEPENDENT_AMBULATORY_CARE_PROVIDER_SITE_OTHER): Payer: Medicare Other | Admitting: General Surgery

## 2013-01-06 VITALS — BP 122/60 | HR 62 | Temp 99.1°F | Resp 12 | Ht 72.0 in | Wt 190.8 lb

## 2013-01-06 DIAGNOSIS — K4090 Unilateral inguinal hernia, without obstruction or gangrene, not specified as recurrent: Secondary | ICD-10-CM

## 2013-01-06 NOTE — Progress Notes (Signed)
Subjective:     Patient ID: Joseph Werner, male   DOB: Jun 19, 1935, 78 y.o.   MRN: [id number]  HPI The patient is a 78 year old white male who is about 6 weeks out from a right inguinal hernia repair with mesh. He tolerated the surgery well. He denies any pain. His appetite is good and his bowels are working normally.  Review of Systems     Objective:   Physical Exam On exam his right inguinal incision is healing nicely with no sign of infection or seroma. His abdominal wall feels solid. There is no palpable evidence for recurrence of the hernia.    Assessment:     The patient is 6 weeks status post right inguinal hernia repair with mesh     Plan:     At this point he may return to all his normal activities without any restrictions. We will plan to see him back on a when necessary basis

## 2013-01-06 NOTE — Patient Instructions (Signed)
May return to all normal activities 

## 2013-01-07 DIAGNOSIS — Z79899 Other long term (current) drug therapy: Secondary | ICD-10-CM | POA: Diagnosis not present

## 2013-01-07 DIAGNOSIS — E7800 Pure hypercholesterolemia, unspecified: Secondary | ICD-10-CM | POA: Diagnosis not present

## 2013-02-04 DIAGNOSIS — H2510 Age-related nuclear cataract, unspecified eye: Secondary | ICD-10-CM | POA: Diagnosis not present

## 2013-02-14 DIAGNOSIS — I2510 Atherosclerotic heart disease of native coronary artery without angina pectoris: Secondary | ICD-10-CM | POA: Diagnosis not present

## 2013-02-14 DIAGNOSIS — E7800 Pure hypercholesterolemia, unspecified: Secondary | ICD-10-CM | POA: Diagnosis not present

## 2013-02-14 DIAGNOSIS — Z951 Presence of aortocoronary bypass graft: Secondary | ICD-10-CM | POA: Diagnosis not present

## 2013-02-14 DIAGNOSIS — R634 Abnormal weight loss: Secondary | ICD-10-CM | POA: Diagnosis not present

## 2013-04-02 ENCOUNTER — Other Ambulatory Visit: Payer: Self-pay

## 2013-04-25 DIAGNOSIS — M899 Disorder of bone, unspecified: Secondary | ICD-10-CM | POA: Diagnosis not present

## 2013-04-25 DIAGNOSIS — E785 Hyperlipidemia, unspecified: Secondary | ICD-10-CM | POA: Diagnosis not present

## 2013-04-25 DIAGNOSIS — Z79899 Other long term (current) drug therapy: Secondary | ICD-10-CM | POA: Diagnosis not present

## 2013-04-25 DIAGNOSIS — C61 Malignant neoplasm of prostate: Secondary | ICD-10-CM | POA: Diagnosis not present

## 2013-04-25 DIAGNOSIS — E039 Hypothyroidism, unspecified: Secondary | ICD-10-CM | POA: Diagnosis not present

## 2013-05-02 DIAGNOSIS — M899 Disorder of bone, unspecified: Secondary | ICD-10-CM | POA: Diagnosis not present

## 2013-05-02 DIAGNOSIS — I2510 Atherosclerotic heart disease of native coronary artery without angina pectoris: Secondary | ICD-10-CM | POA: Diagnosis not present

## 2013-05-02 DIAGNOSIS — Z0000 Encounter for general adult medical examination without abnormal findings: Secondary | ICD-10-CM | POA: Diagnosis not present

## 2013-05-02 DIAGNOSIS — C61 Malignant neoplasm of prostate: Secondary | ICD-10-CM | POA: Diagnosis not present

## 2013-05-02 DIAGNOSIS — Z79899 Other long term (current) drug therapy: Secondary | ICD-10-CM | POA: Diagnosis not present

## 2013-05-02 DIAGNOSIS — D7589 Other specified diseases of blood and blood-forming organs: Secondary | ICD-10-CM | POA: Diagnosis not present

## 2013-05-02 DIAGNOSIS — Z1331 Encounter for screening for depression: Secondary | ICD-10-CM | POA: Diagnosis not present

## 2013-05-02 DIAGNOSIS — E785 Hyperlipidemia, unspecified: Secondary | ICD-10-CM | POA: Diagnosis not present

## 2013-05-02 DIAGNOSIS — E039 Hypothyroidism, unspecified: Secondary | ICD-10-CM | POA: Diagnosis not present

## 2013-05-13 DIAGNOSIS — Z23 Encounter for immunization: Secondary | ICD-10-CM | POA: Diagnosis not present

## 2013-05-22 DIAGNOSIS — H6980 Other specified disorders of Eustachian tube, unspecified ear: Secondary | ICD-10-CM | POA: Diagnosis not present

## 2013-05-22 DIAGNOSIS — R490 Dysphonia: Secondary | ICD-10-CM | POA: Diagnosis not present

## 2013-05-22 DIAGNOSIS — H6120 Impacted cerumen, unspecified ear: Secondary | ICD-10-CM | POA: Diagnosis not present

## 2013-06-19 DIAGNOSIS — Z7983 Long term (current) use of bisphosphonates: Secondary | ICD-10-CM | POA: Diagnosis not present

## 2013-06-19 DIAGNOSIS — M899 Disorder of bone, unspecified: Secondary | ICD-10-CM | POA: Diagnosis not present

## 2013-07-03 ENCOUNTER — Other Ambulatory Visit: Payer: Self-pay

## 2013-07-11 ENCOUNTER — Other Ambulatory Visit (HOSPITAL_COMMUNITY): Payer: Self-pay | Admitting: Family Medicine

## 2013-07-11 ENCOUNTER — Encounter (HOSPITAL_COMMUNITY): Payer: Self-pay

## 2013-07-11 ENCOUNTER — Ambulatory Visit (HOSPITAL_COMMUNITY)
Admission: RE | Admit: 2013-07-11 | Discharge: 2013-07-11 | Disposition: A | Discharge status: Home / Self Care | Payer: Medicare Other | Source: Ambulatory Visit | Attending: Family Medicine | Admitting: Family Medicine

## 2013-07-11 DIAGNOSIS — M810 Age-related osteoporosis without current pathological fracture: Secondary | ICD-10-CM | POA: Insufficient documentation

## 2013-07-11 MED ORDER — SODIUM CHLORIDE 0.9 % IV SOLN
Freq: Once | INTRAVENOUS | Status: AC
  Administered 2013-07-11: 13:00:00 via INTRAVENOUS

## 2013-07-11 MED ORDER — ZOLEDRONIC ACID 5 MG/100ML IV SOLN
5.0000 mg | Freq: Once | INTRAVENOUS | Status: AC
  Administered 2013-07-11: 5 mg via INTRAVENOUS
  Filled 2013-07-11: qty 100

## 2013-09-11 ENCOUNTER — Other Ambulatory Visit (HOSPITAL_BASED_OUTPATIENT_CLINIC_OR_DEPARTMENT_OTHER): Payer: Medicare Other

## 2013-09-11 ENCOUNTER — Ambulatory Visit (HOSPITAL_BASED_OUTPATIENT_CLINIC_OR_DEPARTMENT_OTHER): Payer: Medicare Other | Admitting: Oncology

## 2013-09-11 VITALS — BP 126/59 | HR 50 | Temp 97.4°F | Resp 18 | Ht 72.0 in | Wt 186.4 lb

## 2013-09-11 DIAGNOSIS — I498 Other specified cardiac arrhythmias: Secondary | ICD-10-CM

## 2013-09-11 DIAGNOSIS — D7589 Other specified diseases of blood and blood-forming organs: Secondary | ICD-10-CM | POA: Diagnosis not present

## 2013-09-11 DIAGNOSIS — D649 Anemia, unspecified: Secondary | ICD-10-CM

## 2013-09-11 LAB — CBC WITH DIFFERENTIAL/PLATELET
BASO%: 0.5 % (ref 0.0–2.0)
Basophils Absolute: 0 10*3/uL (ref 0.0–0.1)
EOS%: 1.4 % (ref 0.0–7.0)
Eosinophils Absolute: 0.1 10*3/uL (ref 0.0–0.5)
HCT: 38 % — ABNORMAL LOW (ref 38.4–49.9)
HGB: 12.8 g/dL — ABNORMAL LOW (ref 13.0–17.1)
LYMPH%: 29.7 % (ref 14.0–49.0)
MCH: 36 pg — ABNORMAL HIGH (ref 27.2–33.4)
MCHC: 33.8 g/dL (ref 32.0–36.0)
MCV: 106.8 fL — ABNORMAL HIGH (ref 79.3–98.0)
MONO#: 0.6 10*3/uL (ref 0.1–0.9)
MONO%: 9.9 % (ref 0.0–14.0)
NEUT#: 3.4 10*3/uL (ref 1.5–6.5)
NEUT%: 58.5 % (ref 39.0–75.0)
Platelets: 160 10*3/uL (ref 140–400)
RBC: 3.56 10*6/uL — ABNORMAL LOW (ref 4.20–5.82)
RDW: 12.3 % (ref 11.0–14.6)
WBC: 5.8 10*3/uL (ref 4.0–10.3)
lymph#: 1.7 10*3/uL (ref 0.9–3.3)

## 2013-09-11 NOTE — Progress Notes (Signed)
   Joseph Werner    OFFICE PROGRESS NOTE   INTERVAL HISTORY:   Joseph Werner returns for a scheduled visit. He feels well. No complaint. Good energy level. No fever or night sweats. No bleeding. Good appetite. He reports an intentional weight loss.  Objective:  Vital signs in last 24 hours:  Blood pressure 126/59, pulse 50, temperature 97.4 F (36.3 C), temperature source Oral, resp. rate 18, height 6' (1.829 m), weight 186 lb 6.4 oz (84.55 kg).    HEENT: Neck without mass Lymphatics: No cervical, supraclavicular, left axillary, or inguinal nodes. Pea-sized mobile right axillary node Resp: Lungs clear bilaterally Cardio: Regular rate and rhythm GI: No hepatosplenomegaly, lipoma at the mid upper abdomen Vascular: Trace edema at the ankle bilaterally      Lab Results:  Lab Results  Component Value Date   WBC 5.8 09/11/2013   HGB 12.8* 09/11/2013   HCT 38.0* 09/11/2013   MCV 106.8* 09/11/2013   PLT 160 09/11/2013   NEUTROABS 3.4 09/11/2013      Medications: I have reviewed the patient's current medications.  Assessment/Plan: 1. 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 A bone marrow biopsy in 2006 was nondiagnostic. 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 2. Chronic bradycardia. 3. History of a Borderline low white count - normal today 4. Right inguinal hernia repair 11/18/2012  Disposition:  He is stable from a hematologic standpoint. The chronic red cell macrocytosis may be related to myelodysplasia or liver disease. He would like to continue followup in the hematology clinic. He will return for an office visit and CBC in one year.   Betsy Coder, MD  09/11/2013  9:13 AM

## 2013-09-14 ENCOUNTER — Telehealth: Payer: Self-pay | Admitting: Oncology

## 2013-09-14 NOTE — Telephone Encounter (Signed)
, °

## 2014-01-07 ENCOUNTER — Ambulatory Visit (INDEPENDENT_AMBULATORY_CARE_PROVIDER_SITE_OTHER): Payer: Medicare Other | Admitting: *Deleted

## 2014-01-07 DIAGNOSIS — E7800 Pure hypercholesterolemia, unspecified: Secondary | ICD-10-CM | POA: Diagnosis not present

## 2014-01-07 LAB — HEPATIC FUNCTION PANEL
ALT: 22 U/L (ref 0–53)
AST: 29 U/L (ref 0–37)
Albumin: 3.9 g/dL (ref 3.5–5.2)
Alkaline Phosphatase: 26 U/L — ABNORMAL LOW (ref 39–117)
Bilirubin, Direct: 0.2 mg/dL (ref 0.0–0.3)
Total Bilirubin: 1.6 mg/dL — ABNORMAL HIGH (ref 0.2–1.2)
Total Protein: 6.4 g/dL (ref 6.0–8.3)

## 2014-01-07 LAB — LIPID PANEL
Cholesterol: 135 mg/dL (ref 0–200)
HDL: 77.8 mg/dL (ref >39.00)
LDL Cholesterol: 51 mg/dL (ref 0–99)
Total CHOL/HDL Ratio: 2
Triglycerides: 31 mg/dL (ref 0.0–149.0)
VLDL: 6.2 mg/dL (ref 0.0–40.0)

## 2014-01-20 ENCOUNTER — Other Ambulatory Visit: Payer: Self-pay

## 2014-01-20 MED ORDER — ATORVASTATIN CALCIUM 40 MG PO TABS: 40.0000 mg | ORAL_TABLET | Freq: Every day | ORAL | Status: DC

## 2014-02-13 ENCOUNTER — Ambulatory Visit: Payer: Medicare Other | Admitting: Cardiology

## 2014-02-19 ENCOUNTER — Encounter: Payer: Self-pay | Admitting: *Deleted

## 2014-02-24 ENCOUNTER — Ambulatory Visit (INDEPENDENT_AMBULATORY_CARE_PROVIDER_SITE_OTHER): Payer: Medicare Other | Admitting: Cardiology

## 2014-02-24 ENCOUNTER — Encounter: Payer: Self-pay | Admitting: Cardiology

## 2014-02-24 VITALS — BP 118/80 | HR 46 | Ht 72.0 in | Wt 181.0 lb

## 2014-02-24 DIAGNOSIS — Z951 Presence of aortocoronary bypass graft: Secondary | ICD-10-CM | POA: Insufficient documentation

## 2014-02-24 DIAGNOSIS — I2510 Atherosclerotic heart disease of native coronary artery without angina pectoris: Secondary | ICD-10-CM

## 2014-02-24 DIAGNOSIS — R001 Bradycardia, unspecified: Secondary | ICD-10-CM | POA: Insufficient documentation

## 2014-02-24 DIAGNOSIS — I498 Other specified cardiac arrhythmias: Secondary | ICD-10-CM | POA: Diagnosis not present

## 2014-02-24 NOTE — Patient Instructions (Signed)
The current medical regimen is effective;  continue present plan and medications.  Follow up in 1 year with Dr Skains.  You will receive a letter in the mail 2 months before you are due.  Please call us when you receive this letter to schedule your follow up appointment.  

## 2014-02-24 NOTE — Progress Notes (Signed)
[facility]. [address], [place]  [address] Phone: [phone] Fax:  [phone]  Date:  02/24/2014   ID:  Joseph Werner, DOB July 08, 1935, MRN [id number]  PCP:  Gennette Pac, MD   History of Present Illness: Joseph Werner is a 79 y.o. male with CAD with CABG in 1999, asymptomatic prior to CABG. Went to PCP and ordered a stress test at the time. In the middle of the test he stopped and Dr. Leonia Reeves performed cath at the time. Went to CABG. No subsequent caths. Had been having ETT yearly. We discussed guidelines in that a yearly stress test evaluation was not necessary unless symptoms have changed. He was concerned about this because he has been having a stress test every year, exercise treadmill test, and then he did not have any symptoms prior to his bypass surgery. I also fully explained to him that it would not be unreasonable to forego stress testing at this point. He is willing to forego stress testing at this point. LIMA graft. He did not have any vein grafts. No palpitations, no syncope, no dizziness. No CVA. Never tobacco.     Wt Readings from Last 3 Encounters:  02/24/14 181 lb (82.101 kg)  09/11/13 186 lb 6.4 oz (84.55 kg)  01/06/13 190 lb 12.8 oz (86.546 kg)     Past Medical History  Diagnosis Date  . Prostate cancer     H/O  . Dyslipidemia   . Hypothyroid   . Eczema   . Anemia     macrocytic anemia  . Bradycardia     chronic  . Osteopenia     on bone density test from orthopedist Dr. Nelva Bush, bone density test in July 2010 showed T score-1.1 in the lumbar spine and 2.1 in the femur. Bone density in August 2012 showed T score 2.3 in the femur and 2.2 in the forearm  . Hyperlipidemia   . Arthritis   . CAD (coronary artery disease)   . Prostate cancer     followed by urology Dr. Diona Fanti  . Macrocytosis     with normal bone marrow biopsy in 2006 may be benign varient, evaluated in the pastby hematologistDr. Benay Spice  . Loss of weight   .  Postsurgical aortocoronary bypass status     Past Surgical History  Procedure Laterality Date  . Coronary artery bypass graft  1998  . Prostatectomy    . Cholecystectomy  1982  . Appendectomy    . Inguinal hernia repair Right 11/18/2012    Procedure: HERNIA REPAIR INGUINAL ADULT;  Surgeon: Merrie Roof, MD;  Location: [facility];  Service: General;  Laterality: Right;  . Insertion of mesh Right 11/18/2012    Procedure: INSERTION OF MESH;  Surgeon: Merrie Roof, MD;  Location: [facility];  Service: General;  Laterality: Right;  . Hernia repair      Current Outpatient Prescriptions  Medication Sig Dispense Refill  . aspirin EC 81 MG tablet Take 81 mg by mouth daily.      Marland Kitchen atorvastatin (LIPITOR) 40 MG tablet Take 1 tablet (40 mg total) by mouth daily.  30 tablet  1  . Cholecalciferol (VITAMIN D) 2000 UNITS CAPS Take 2,000 Units by mouth daily.       Marland Kitchen GLUCOSAMINE-CHONDROITIN PO Take 1 tablet by mouth 3 (three) times daily.      . hydrocortisone valerate cream (WESTCORT) 0.2 % Apply 1 application topically 2 (two) times daily.       Marland Kitchen  ibuprofen (ADVIL,MOTRIN) 200 MG tablet Take 400 mg by mouth every 6 (six) hours as needed for pain.      Marland Kitchen levothyroxine (SYNTHROID, LEVOTHROID) 100 MCG tablet Take 100 mcg by mouth daily.      . Magnesium 400 MG CAPS Take 400 mg by mouth daily.       Marland Kitchen omega-3 acid ethyl esters (LOVAZA) 1 G capsule Take 1 g by mouth.      . Selenium 200 MCG CAPS Take 200 mcg by mouth daily.       No current facility-administered medications for this visit.    Allergies:   No Known Allergies  Social History:  The patient  reports that he has never smoked. He does not have any smokeless tobacco history on file. He reports that he drinks about 3 ounces of alcohol per week. He reports that he does not use illicit drugs.   Family History  Problem Relation Age of Onset  . Heart disease Father   . Prostate cancer Father   . Hypertension Father   . Cancer Father     prostate    . Lung cancer Mother   . Cancer Mother     lung  . Diabetes Brother   . Cancer Brother     brain  . Cancer Sister     breast    ROS:  Please see the history of present illness.   No syncope, no bleeding, no chest pain, no SOB.   All other systems reviewed and negative.   PHYSICAL EXAM: VS:  BP 118/80  Pulse 46  Ht 6' (1.829 m)  Wt 181 lb (82.101 kg)  BMI 24.54 kg/m2 Well nourished, well developed, in no acute distress HEENT: normal, [place]/AT, EOMI Neck: no JVD, normal carotid upstroke, no bruit Cardiac:  normal S1, S2; RRR; no murmur Lungs:  clear to auscultation bilaterally, no wheezing, rhonchi or rales Abd: soft, nontender, no hepatomegaly, no bruits Ext: no edema, 2+ distal pulses Skin: warm and dry GU: deferred Neuro: no focal abnormalities noted, AAO x 3  EKG:  48 beats per min, other wise normal. LDL 51.   ASSESSMENT AND PLAN:  1. Coronary artery disease-status post bypass surgery 1999-doing very well, asymptomatic. No shortness of breath. Exercises frequently. Doing very well. Secondary prevention. Aspirin. No beta blocker secondary to bradycardia. 2. Hyperlipidemia-excellent control with atorvastatin-LDL at goal of less than 70. I discussed with him that I would be fine with him discontinuing his Lovaza if he desired. His triglycerides currently are 31. 3. Bradycardia-chronic. Asymptomatic, no syncope. Avoid beta blockers. 4. One year followup  Signed, Candee Furbish, MD [facility]  02/24/2014 10:49 AM

## 2014-03-15 ENCOUNTER — Other Ambulatory Visit: Payer: Self-pay | Admitting: Cardiology

## 2014-03-16 ENCOUNTER — Other Ambulatory Visit: Payer: Self-pay

## 2014-03-16 MED ORDER — ATORVASTATIN CALCIUM 40 MG PO TABS: 40.0000 mg | ORAL_TABLET | Freq: Every day | ORAL | Status: DC

## 2014-05-13 DIAGNOSIS — Z23 Encounter for immunization: Secondary | ICD-10-CM | POA: Diagnosis not present

## 2014-05-13 DIAGNOSIS — I2510 Atherosclerotic heart disease of native coronary artery without angina pectoris: Secondary | ICD-10-CM | POA: Diagnosis not present

## 2014-05-13 DIAGNOSIS — D7589 Other specified diseases of blood and blood-forming organs: Secondary | ICD-10-CM | POA: Diagnosis not present

## 2014-05-13 DIAGNOSIS — E039 Hypothyroidism, unspecified: Secondary | ICD-10-CM | POA: Diagnosis not present

## 2014-05-13 DIAGNOSIS — E785 Hyperlipidemia, unspecified: Secondary | ICD-10-CM | POA: Diagnosis not present

## 2014-05-13 DIAGNOSIS — C61 Malignant neoplasm of prostate: Secondary | ICD-10-CM | POA: Diagnosis not present

## 2014-05-13 DIAGNOSIS — M949 Disorder of cartilage, unspecified: Secondary | ICD-10-CM | POA: Diagnosis not present

## 2014-05-13 DIAGNOSIS — M899 Disorder of bone, unspecified: Secondary | ICD-10-CM | POA: Diagnosis not present

## 2014-05-13 DIAGNOSIS — Z0000 Encounter for general adult medical examination without abnormal findings: Secondary | ICD-10-CM | POA: Diagnosis not present

## 2014-05-26 DIAGNOSIS — M899 Disorder of bone, unspecified: Secondary | ICD-10-CM | POA: Diagnosis not present

## 2014-05-26 DIAGNOSIS — M949 Disorder of cartilage, unspecified: Secondary | ICD-10-CM | POA: Diagnosis not present

## 2014-06-01 DIAGNOSIS — Z23 Encounter for immunization: Secondary | ICD-10-CM | POA: Diagnosis not present

## 2014-07-03 DIAGNOSIS — D7589 Other specified diseases of blood and blood-forming organs: Secondary | ICD-10-CM | POA: Diagnosis not present

## 2014-07-03 DIAGNOSIS — I2510 Atherosclerotic heart disease of native coronary artery without angina pectoris: Secondary | ICD-10-CM | POA: Diagnosis not present

## 2014-07-03 DIAGNOSIS — Z23 Encounter for immunization: Secondary | ICD-10-CM | POA: Diagnosis not present

## 2014-07-03 DIAGNOSIS — Z79899 Other long term (current) drug therapy: Secondary | ICD-10-CM | POA: Diagnosis not present

## 2014-07-03 DIAGNOSIS — M8580 Other specified disorders of bone density and structure, unspecified site: Secondary | ICD-10-CM | POA: Diagnosis not present

## 2014-07-07 DIAGNOSIS — H2513 Age-related nuclear cataract, bilateral: Secondary | ICD-10-CM | POA: Diagnosis not present

## 2014-07-30 ENCOUNTER — Ambulatory Visit (HOSPITAL_COMMUNITY)
Admission: RE | Admit: 2014-07-30 | Discharge: 2014-07-30 | Disposition: A | Discharge status: Home / Self Care | Payer: Medicare Other | Source: Ambulatory Visit | Attending: Family Medicine | Admitting: Family Medicine

## 2014-07-30 ENCOUNTER — Other Ambulatory Visit (HOSPITAL_COMMUNITY): Payer: Self-pay | Admitting: Family Medicine

## 2014-07-30 DIAGNOSIS — M8580 Other specified disorders of bone density and structure, unspecified site: Secondary | ICD-10-CM | POA: Diagnosis not present

## 2014-07-30 MED ORDER — SODIUM CHLORIDE 0.9 % IV SOLN
Freq: Once | INTRAVENOUS | Status: AC
  Administered 2014-07-30: 13:00:00 via INTRAVENOUS

## 2014-07-30 MED ORDER — ZOLEDRONIC ACID 5 MG/100ML IV SOLN
5.0000 mg | Freq: Once | INTRAVENOUS | Status: AC
  Administered 2014-07-30: 5 mg via INTRAVENOUS
  Filled 2014-07-30: qty 100

## 2014-07-30 NOTE — Discharge Instructions (Signed)
Drink fluids/water as tolerated over next 72hrs °Tylenol or Ibuprofen OTC as directed °Continue calcium and Vit D as directed by your MDZoledronic Acid injection (Paget's Disease, Osteoporosis) °What is this medicine? °ZOLEDRONIC ACID (ZOE le dron ik AS id) lowers the amount of calcium loss from bone. It is used to treat Paget's disease and osteoporosis in women. °This medicine may be used for other purposes; ask your health care provider or pharmacist if you have questions. °COMMON BRAND NAME(S): Reclast, Zometa °What should I tell my health care provider before I take this medicine? °They need to know if you have any of these conditions: °-aspirin-sensitive asthma °-cancer, especially if you are receiving medicines used to treat cancer °-dental disease or wear dentures °-infection °-kidney disease °-low levels of calcium in the blood °-past surgery on the parathyroid gland or intestines °-receiving corticosteroids like dexamethasone or prednisone °-an unusual or allergic reaction to zoledronic acid, other medicines, foods, dyes, or preservatives °-pregnant or trying to get pregnant °-breast-feeding °How should I use this medicine? °This medicine is for infusion into a vein. It is given by a health care professional in a hospital or clinic setting. °Talk to your pediatrician regarding the use of this medicine in children. This medicine is not approved for use in children. °Overdosage: If you think you have taken too much of this medicine contact a poison control center or emergency room at once. °NOTE: This medicine is only for you. Do not share this medicine with others. °What if I miss a dose? °It is important not to miss your dose. Call your doctor or health care professional if you are unable to keep an appointment. °What may interact with this medicine? °-certain antibiotics given by injection °-NSAIDs, medicines for pain and inflammation, like ibuprofen or naproxen °-some diuretics like bumetanide,  furosemide °-teriparatide °This list may not describe all possible interactions. Give your health care provider a list of all the medicines, herbs, non-prescription drugs, or dietary supplements you use. Also tell them if you smoke, drink alcohol, or use illegal drugs. Some items may interact with your medicine. °What should I watch for while using this medicine? °Visit your doctor or health care professional for regular checkups. It may be some time before you see the benefit from this medicine. Do not stop taking your medicine unless your doctor tells you to. Your doctor may order blood tests or other tests to see how you are doing. °Women should inform their doctor if they wish to become pregnant or think they might be pregnant. There is a potential for serious side effects to an unborn child. Talk to your health care professional or pharmacist for more information. °You should make sure that you get enough calcium and vitamin D while you are taking this medicine. Discuss the foods you eat and the vitamins you take with your health care professional. °Some people who take this medicine have severe bone, joint, and/or muscle pain. This medicine may also increase your risk for jaw problems or a broken thigh bone. Tell your doctor right away if you have severe pain in your jaw, bones, joints, or muscles. Tell your doctor if you have any pain that does not go away or that gets worse. °Tell your dentist and dental surgeon that you are taking this medicine. You should not have major dental surgery while on this medicine. See your dentist to have a dental exam and fix any dental problems before starting this medicine. Take good care of your teeth while on   this medicine. Make sure you see your dentist for regular follow-up appointments. °What side effects may I notice from receiving this medicine? °Side effects that you should report to your doctor or health care professional as soon as possible: °-allergic reactions  like skin rash, itching or hives, swelling of the face, lips, or tongue °-anxiety, confusion, or depression °-breathing problems °-changes in vision °-eye pain °-feeling faint or lightheaded, falls °-jaw pain, especially after dental work °-mouth sores °-muscle cramps, stiffness, or weakness °-trouble passing urine or change in the amount of urine °Side effects that usually do not require medical attention (report to your doctor or health care professional if they continue or are bothersome): °-bone, joint, or muscle pain °-constipation °-diarrhea °-fever °-hair loss °-irritation at site where injected °-loss of appetite °-nausea, vomiting °-stomach upset °-trouble sleeping °-trouble swallowing °-weak or tired °This list may not describe all possible side effects. Call your doctor for medical advice about side effects. You may report side effects to FDA at 1-800-FDA-1088. °Where should I keep my medicine? °This drug is given in a hospital or clinic and will not be stored at home. °NOTE: This sheet is a summary. It may not cover all possible information. If you have questions about this medicine, talk to your doctor, pharmacist, or health care provider. °© 2015, [employer]. (2013-01-27 10:03:48) ° °

## 2014-09-16 ENCOUNTER — Other Ambulatory Visit: Payer: Self-pay | Admitting: *Deleted

## 2014-09-16 DIAGNOSIS — D649 Anemia, unspecified: Secondary | ICD-10-CM

## 2014-09-17 ENCOUNTER — Telehealth: Payer: Self-pay | Admitting: Oncology

## 2014-09-17 ENCOUNTER — Other Ambulatory Visit (HOSPITAL_BASED_OUTPATIENT_CLINIC_OR_DEPARTMENT_OTHER): Payer: Medicare Other

## 2014-09-17 ENCOUNTER — Ambulatory Visit (HOSPITAL_BASED_OUTPATIENT_CLINIC_OR_DEPARTMENT_OTHER): Payer: Medicare Other | Admitting: Oncology

## 2014-09-17 VITALS — BP 126/53 | HR 52 | Temp 98.3°F | Resp 18 | Ht 72.0 in | Wt 178.8 lb

## 2014-09-17 DIAGNOSIS — D7589 Other specified diseases of blood and blood-forming organs: Secondary | ICD-10-CM

## 2014-09-17 DIAGNOSIS — R001 Bradycardia, unspecified: Secondary | ICD-10-CM

## 2014-09-17 DIAGNOSIS — D649 Anemia, unspecified: Secondary | ICD-10-CM

## 2014-09-17 LAB — CBC WITH DIFFERENTIAL/PLATELET
BASO%: 0.7 % (ref 0.0–2.0)
Basophils Absolute: 0 10*3/uL (ref 0.0–0.1)
EOS%: 3.9 % (ref 0.0–7.0)
Eosinophils Absolute: 0.1 10*3/uL (ref 0.0–0.5)
HCT: 38.8 % (ref 38.4–49.9)
HGB: 12.5 g/dL — ABNORMAL LOW (ref 13.0–17.1)
LYMPH%: 32 % (ref 14.0–49.0)
MCH: 34.7 pg — ABNORMAL HIGH (ref 27.2–33.4)
MCHC: 32.4 g/dL (ref 32.0–36.0)
MCV: 107.2 fL — ABNORMAL HIGH (ref 79.3–98.0)
MONO#: 0.5 10*3/uL (ref 0.1–0.9)
MONO%: 12 % (ref 0.0–14.0)
NEUT#: 1.9 10*3/uL (ref 1.5–6.5)
NEUT%: 51.4 % (ref 39.0–75.0)
Platelets: 160 10*3/uL (ref 140–400)
RBC: 3.62 10*6/uL — ABNORMAL LOW (ref 4.20–5.82)
RDW: 12.2 % (ref 11.0–14.6)
WBC: 3.8 10*3/uL — ABNORMAL LOW (ref 4.0–10.3)
lymph#: 1.2 10*3/uL (ref 0.9–3.3)

## 2014-09-17 NOTE — Progress Notes (Signed)
  [facility] OFFICE PROGRESS NOTE   Diagnosis: Anemia  INTERVAL HISTORY:   Joseph Werner returns as scheduled. He reports feeling well. No recent infection. Good appetite. He reports intentional weight loss with dieting. No fever. He occasionally gets hot at night. No drenching night sweats.  Objective:  Vital signs in last 24 hours:  Blood pressure 126/53, pulse 52, temperature 98.3 F (36.8 C), temperature source Oral, resp. rate 18, height 6' (1.829 m), weight 178 lb 12.8 oz (81.103 kg), SpO2 100 %.    HEENT: Neck without mass Lymphatics: No cervical, supraclavicular, or inguinal nodes. "Shotty "bilateral axillar nodes. Resp: Lungs clear bilaterally Cardio: Regular rate and rhythm GI: No hepatosplenomegaly, fullness in the left mid abdomen-soft and tubular,? Abdominal hernia. No discrete mass. Vascular: Trace ankle edema bilaterally   Lab Results:  Lab Results  Component Value Date   WBC 3.8* 09/17/2014   HGB 12.5* 09/17/2014   HCT 38.8 09/17/2014   MCV 107.2* 09/17/2014   PLT 160 09/17/2014   NEUTROABS 1.9 09/17/2014     Medications: I have reviewed the patient's current medications.  Assessment/Plan: 1. 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 A bone marrow biopsy in 2006 was nondiagnostic. 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 2. Chronic bradycardia. 3. History of a Borderline low white count 4. Right inguinal hernia repair 11/18/2012  Disposition:  He is stable from a hematologic standpoint. Joseph Werner would like to continue follow-up in the Hematology clinic. He will return for an office visit and CBC in one year. He will remain up-to-date only influenza and pneumococcal vaccines. He will seek medical attention for symptoms of an infection in the setting of borderline leukopenia and possible myelodysplasia.  Betsy Coder, MD  09/17/2014  10:27 AM

## 2014-09-17 NOTE — Telephone Encounter (Signed)
Pt confirmed labs/ov per 01/21 POF, gave pt AVS... KJ °

## 2014-12-23 ENCOUNTER — Other Ambulatory Visit: Payer: Self-pay

## 2014-12-23 MED ORDER — ATORVASTATIN CALCIUM 40 MG PO TABS: 40.0000 mg | ORAL_TABLET | Freq: Every day | ORAL | Status: DC

## 2015-02-26 ENCOUNTER — Ambulatory Visit (INDEPENDENT_AMBULATORY_CARE_PROVIDER_SITE_OTHER): Payer: Medicare Other | Admitting: Cardiology

## 2015-02-26 ENCOUNTER — Encounter: Payer: Self-pay | Admitting: Cardiology

## 2015-02-26 VITALS — BP 120/65 | HR 53 | Ht 72.0 in | Wt 181.0 lb

## 2015-02-26 DIAGNOSIS — R001 Bradycardia, unspecified: Secondary | ICD-10-CM | POA: Diagnosis not present

## 2015-02-26 DIAGNOSIS — Z951 Presence of aortocoronary bypass graft: Secondary | ICD-10-CM

## 2015-02-26 DIAGNOSIS — I2510 Atherosclerotic heart disease of native coronary artery without angina pectoris: Secondary | ICD-10-CM | POA: Diagnosis not present

## 2015-02-26 NOTE — Patient Instructions (Signed)
Medication Instructions:  Your physician recommends that you continue on your current medications as directed. Please refer to the Current Medication list given to you today.   Labwork: NONE  Testing/Procedures: NONE  Follow-Up: 1 YEAR WITH DR. Marlou Porch  Any Other Special Instructions Will Be Listed Below (If Applicable).

## 2015-02-26 NOTE — Progress Notes (Signed)
[facility]. [address], [address] [place], [place]  [address] Phone: [phone] Fax:  [phone]  Date:  02/26/2015   ID:  Joseph Werner, DOB 01-18-1935, MRN [id number]  PCP:  Gennette Pac, MD   History of Present Illness: Joseph Werner is a 80 y.o. male with CAD with CABG in 1999, asymptomatic prior to CABG. Went to PCP and ordered a stress test at the time. In the middle of the test he stopped and Dr. Leonia Reeves performed cath at the time. Went to CABG. No subsequent caths. Had been having ETT yearly. We discussed guidelines in that a yearly stress test evaluation was not necessary unless symptoms have changed. He was concerned about this because he has been having a stress test every year, exercise treadmill test, and then he did not have any symptoms prior to his bypass surgery. I also fully explained to him that it would not be unreasonable to forego stress testing at this point. He is willing to forego stress testing at this point. LIMA graft. He did not have any vein grafts. No palpitations, no syncope, no dizziness. No CVA. Never tobacco.     Wt Readings from Last 3 Encounters:  02/26/15 181 lb (82.101 kg)  09/17/14 178 lb 12.8 oz (81.103 kg)  07/30/14 178 lb (80.74 kg)     Past Medical History  Diagnosis Date  . Prostate cancer     H/O  . Dyslipidemia   . Hypothyroid   . Eczema   . Anemia     macrocytic anemia  . Bradycardia     chronic  . Osteopenia     on bone density test from orthopedist Dr. Nelva Bush, bone density test in July 2010 showed T score-1.1 in the lumbar spine and 2.1 in the femur. Bone density in August 2012 showed T score 2.3 in the femur and 2.2 in the forearm  . Hyperlipidemia   . Arthritis   . CAD (coronary artery disease)   . Prostate cancer     followed by urology Dr. Diona Fanti  . Macrocytosis     with normal bone marrow biopsy in 2006 may be benign varient, evaluated in the pastby hematologistDr. Benay Spice  . Loss of weight   .  Postsurgical aortocoronary bypass status     Past Surgical History  Procedure Laterality Date  . Coronary artery bypass graft  1998  . Prostatectomy    . Cholecystectomy  1982  . Appendectomy    . Inguinal hernia repair Right 11/18/2012    Procedure: HERNIA REPAIR INGUINAL ADULT;  Surgeon: Merrie Roof, MD;  Location: [facility];  Service: General;  Laterality: Right;  . Insertion of mesh Right 11/18/2012    Procedure: INSERTION OF MESH;  Surgeon: Merrie Roof, MD;  Location: [facility];  Service: General;  Laterality: Right;  . Hernia repair      Current Outpatient Prescriptions  Medication Sig Dispense Refill  . aspirin EC 81 MG tablet Take 81 mg by mouth daily.    Marland Kitchen atorvastatin (LIPITOR) 40 MG tablet Take 1 tablet (40 mg total) by mouth daily. 90 tablet 1  . calcium carbonate (OS-CAL) 600 MG TABS tablet Take 600 mg by mouth daily.    . Cholecalciferol (VITAMIN D) 2000 UNITS CAPS Take 2,000 Units by mouth daily.     . Flaxseed, Linseed, (FLAX SEED OIL PO) Take 1 capsule by mouth daily.    Marland Kitchen GLUCOSAMINE-CHONDROITIN PO Take 1 tablet by mouth  3 (three) times daily.    . hydrocortisone valerate cream (WESTCORT) 0.2 % Apply 1 application topically 2 (two) times daily.     Marland Kitchen ibuprofen (ADVIL,MOTRIN) 200 MG tablet Take 400 mg by mouth every 6 (six) hours as needed for pain.    Marland Kitchen levothyroxine (SYNTHROID, LEVOTHROID) 100 MCG tablet Take 100 mcg by mouth daily.    . Magnesium 400 MG CAPS Take 400 mg by mouth daily.     . Selenium 200 MCG CAPS Take 200 mcg by mouth daily.     No current facility-administered medications for this visit.    Allergies:   No Known Allergies  Social History:  The patient  reports that he has never smoked. He does not have any smokeless tobacco history on file. He reports that he drinks about 3.0 oz of alcohol per week. He reports that he does not use illicit drugs.   Family History  Problem Relation Age of Onset  . Heart disease Father   . Prostate cancer  Father   . Hypertension Father   . Cancer Father     prostate  . Lung cancer Mother   . Cancer Mother     lung  . Diabetes Brother   . Cancer Brother     brain  . Cancer Sister     breast    ROS:  Please see the history of present illness.   No syncope, no bleeding, no chest pain, no SOB.   All other systems reviewed and negative.   PHYSICAL EXAM: VS:  Ht 6' (1.829 m)  Wt 181 lb (82.101 kg)  BMI 24.54 kg/m2 Well nourished, well developed, in no acute distress HEENT: normal, [place]/AT, EOMI Neck: no JVD, normal carotid upstroke, no bruit Cardiac:  normal S1, S2; RRR; no murmur Lungs:  clear to auscultation bilaterally, no wheezing, rhonchi or rales Abd: soft, nontender, no hepatomegaly, no bruits Ext: trace ankle edema, 2+ distal pulses Skin: warm and dry GU: deferred Neuro: no focal abnormalities noted, AAO x 3  EKG:  02/26/15-sinus bradycardia rate 53. Prior EKG-48 beats per min, other wise normal.  LDL 51 on 01/07/14   ASSESSMENT AND PLAN:  1. Coronary artery disease-status post bypass surgery 1999-doing very well, asymptomatic. No shortness of breath. Exercises frequently. Doing very well. Secondary prevention. Aspirin. No beta blocker secondary to bradycardia. 2. Hyperlipidemia-excellent control with atorvastatin-LDL at goal of less than 70, currently 51 on last check. Dr. Rex Kras has been following his lipids. 3. Bradycardia-chronic. Asymptomatic, no syncope. Avoid beta blockers. 4. Edema-can try compression hose. Dependent edema. Minor. 5. One year follow up.   Signed, Candee Furbish, MD [facility]  02/26/2015 3:30 PM

## 2015-05-27 DIAGNOSIS — Z23 Encounter for immunization: Secondary | ICD-10-CM | POA: Diagnosis not present

## 2015-05-27 DIAGNOSIS — I25119 Atherosclerotic heart disease of native coronary artery with unspecified angina pectoris: Secondary | ICD-10-CM | POA: Diagnosis not present

## 2015-05-27 DIAGNOSIS — M899 Disorder of bone, unspecified: Secondary | ICD-10-CM | POA: Diagnosis not present

## 2015-05-27 DIAGNOSIS — M8580 Other specified disorders of bone density and structure, unspecified site: Secondary | ICD-10-CM | POA: Diagnosis not present

## 2015-05-27 DIAGNOSIS — E785 Hyperlipidemia, unspecified: Secondary | ICD-10-CM | POA: Diagnosis not present

## 2015-05-27 DIAGNOSIS — Z79899 Other long term (current) drug therapy: Secondary | ICD-10-CM | POA: Diagnosis not present

## 2015-05-27 DIAGNOSIS — E039 Hypothyroidism, unspecified: Secondary | ICD-10-CM | POA: Diagnosis not present

## 2015-05-27 DIAGNOSIS — Z0000 Encounter for general adult medical examination without abnormal findings: Secondary | ICD-10-CM | POA: Diagnosis not present

## 2015-05-27 DIAGNOSIS — Z8546 Personal history of malignant neoplasm of prostate: Secondary | ICD-10-CM | POA: Diagnosis not present

## 2015-05-27 DIAGNOSIS — D7589 Other specified diseases of blood and blood-forming organs: Secondary | ICD-10-CM | POA: Diagnosis not present

## 2015-06-16 ENCOUNTER — Other Ambulatory Visit: Payer: Self-pay | Admitting: Cardiology

## 2015-07-02 ENCOUNTER — Telehealth: Payer: Self-pay | Admitting: Oncology

## 2015-07-02 NOTE — Telephone Encounter (Signed)
returned call and s.w. pt and confirmed Jan 2017 appt....pt ok and aware

## 2015-08-06 DIAGNOSIS — H2513 Age-related nuclear cataract, bilateral: Secondary | ICD-10-CM | POA: Diagnosis not present

## 2015-08-13 DIAGNOSIS — Z0000 Encounter for general adult medical examination without abnormal findings: Secondary | ICD-10-CM | POA: Diagnosis not present

## 2015-08-13 DIAGNOSIS — M899 Disorder of bone, unspecified: Secondary | ICD-10-CM | POA: Diagnosis not present

## 2015-08-13 DIAGNOSIS — Z8546 Personal history of malignant neoplasm of prostate: Secondary | ICD-10-CM | POA: Diagnosis not present

## 2015-08-13 DIAGNOSIS — Z79899 Other long term (current) drug therapy: Secondary | ICD-10-CM | POA: Diagnosis not present

## 2015-08-13 DIAGNOSIS — I25119 Atherosclerotic heart disease of native coronary artery with unspecified angina pectoris: Secondary | ICD-10-CM | POA: Diagnosis not present

## 2015-08-13 DIAGNOSIS — E785 Hyperlipidemia, unspecified: Secondary | ICD-10-CM | POA: Diagnosis not present

## 2015-08-13 DIAGNOSIS — M8580 Other specified disorders of bone density and structure, unspecified site: Secondary | ICD-10-CM | POA: Diagnosis not present

## 2015-08-13 DIAGNOSIS — E039 Hypothyroidism, unspecified: Secondary | ICD-10-CM | POA: Diagnosis not present

## 2015-08-13 DIAGNOSIS — Z23 Encounter for immunization: Secondary | ICD-10-CM | POA: Diagnosis not present

## 2015-08-25 ENCOUNTER — Other Ambulatory Visit (HOSPITAL_COMMUNITY): Payer: Self-pay | Admitting: Family Medicine

## 2015-08-31 ENCOUNTER — Encounter (HOSPITAL_COMMUNITY): Payer: Self-pay

## 2015-08-31 ENCOUNTER — Ambulatory Visit (HOSPITAL_COMMUNITY)
Admission: RE | Admit: 2015-08-31 | Discharge: 2015-08-31 | Disposition: A | Discharge status: Home / Self Care | Payer: Medicare Other | Source: Ambulatory Visit | Attending: Family Medicine | Admitting: Family Medicine

## 2015-08-31 DIAGNOSIS — M810 Age-related osteoporosis without current pathological fracture: Secondary | ICD-10-CM | POA: Insufficient documentation

## 2015-08-31 MED ORDER — SODIUM CHLORIDE 0.9 % IV SOLN
Freq: Once | INTRAVENOUS | Status: AC
Start: 2015-08-31 — End: 2015-08-31
  Administered 2015-08-31: 13:00:00 via INTRAVENOUS

## 2015-08-31 MED ORDER — ZOLEDRONIC ACID 5 MG/100ML IV SOLN
5.0000 mg | Freq: Once | INTRAVENOUS | Status: AC
  Administered 2015-08-31: 5 mg via INTRAVENOUS
  Filled 2015-08-31: qty 100

## 2015-08-31 NOTE — Discharge Instructions (Signed)

## 2015-09-16 ENCOUNTER — Other Ambulatory Visit (HOSPITAL_BASED_OUTPATIENT_CLINIC_OR_DEPARTMENT_OTHER): Payer: Medicare Other

## 2015-09-16 ENCOUNTER — Ambulatory Visit (HOSPITAL_BASED_OUTPATIENT_CLINIC_OR_DEPARTMENT_OTHER): Payer: Medicare Other | Admitting: Oncology

## 2015-09-16 ENCOUNTER — Telehealth: Payer: Self-pay | Admitting: Oncology

## 2015-09-16 VITALS — BP 135/67 | HR 62 | Temp 97.0°F | Resp 18 | Ht 71.5 in | Wt 179.4 lb

## 2015-09-16 DIAGNOSIS — R001 Bradycardia, unspecified: Secondary | ICD-10-CM

## 2015-09-16 DIAGNOSIS — D649 Anemia, unspecified: Secondary | ICD-10-CM

## 2015-09-16 LAB — CBC WITH DIFFERENTIAL/PLATELET
BASO%: 0.7 % (ref 0.0–2.0)
Basophils Absolute: 0 10*3/uL (ref 0.0–0.1)
EOS%: 2.9 % (ref 0.0–7.0)
Eosinophils Absolute: 0.2 10*3/uL (ref 0.0–0.5)
HCT: 37.2 % — ABNORMAL LOW (ref 38.4–49.9)
HGB: 12.4 g/dL — ABNORMAL LOW (ref 13.0–17.1)
LYMPH%: 26.5 % (ref 14.0–49.0)
MCH: 35.3 pg — ABNORMAL HIGH (ref 27.2–33.4)
MCHC: 33.3 g/dL (ref 32.0–36.0)
MCV: 105.8 fL — ABNORMAL HIGH (ref 79.3–98.0)
MONO#: 0.6 10*3/uL (ref 0.1–0.9)
MONO%: 9.3 % (ref 0.0–14.0)
NEUT#: 3.6 10*3/uL (ref 1.5–6.5)
NEUT%: 60.6 % (ref 39.0–75.0)
Platelets: 177 10*3/uL (ref 140–400)
RBC: 3.51 10*6/uL — ABNORMAL LOW (ref 4.20–5.82)
RDW: 12 % (ref 11.0–14.6)
WBC: 6 10*3/uL (ref 4.0–10.3)
lymph#: 1.6 10*3/uL (ref 0.9–3.3)

## 2015-09-16 NOTE — Progress Notes (Signed)
  [place] OFFICE PROGRESS NOTE   Diagnosis: Macrocytic anemia  INTERVAL HISTORY:   Joseph Werner returns as scheduled. He feels well. He reports discoloration at the tip of 1 finger in the cold. No other complaint.  Objective:  Vital signs in last 24 hours:  Blood pressure 135/67, pulse 62, temperature 97 F (36.1 C), temperature source Oral, resp. rate 18, height 5' 11.5" (1.816 m), weight 179 lb 6.4 oz (81.375 kg), SpO2 100 %.    HEENT: Neck without mass Lymphatics: No cervical, supraclavicular, axillary, or inguinal nodes Resp: Lungs clear bilaterally Cardio: Regular rate and rhythm GI: No hepatosplenomegaly, slight fullness in the left compared to the right mid abdomen Vascular: Trace edema at the left greater than right lower leg and ankle   Lab Results:  Lab Results  Component Value Date   WBC 6.0 09/16/2015   HGB 12.4* 09/16/2015   HCT 37.2* 09/16/2015   MCV 105.8* 09/16/2015   PLT 177 09/16/2015   NEUTROABS 3.6 09/16/2015     Medications: I have reviewed the patient's current medications.  Assessment/Plan: 1. 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 A bone marrow biopsy in 2006 was nondiagnostic. 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 2. Chronic bradycardia. 3. History of a Borderline low white count 4. Right inguinal hernia repair 11/18/2012    Disposition:  Joseph Werner is stable from a hematologic standpoint. He would like to continue follow-up in the Hematology clinic. He will return for an office visit and CBC in one year.  Betsy Coder, MD  09/16/2015  9:10 AM

## 2015-09-16 NOTE — Telephone Encounter (Signed)
Gv pt appt for 09/14/16.

## 2015-12-07 ENCOUNTER — Other Ambulatory Visit: Payer: Self-pay | Admitting: Cardiology

## 2016-02-23 ENCOUNTER — Encounter: Payer: Self-pay | Admitting: *Deleted

## 2016-02-25 ENCOUNTER — Ambulatory Visit: Payer: 59 | Admitting: Cardiology

## 2016-03-10 ENCOUNTER — Ambulatory Visit (INDEPENDENT_AMBULATORY_CARE_PROVIDER_SITE_OTHER): Payer: Medicare Other | Admitting: Cardiology

## 2016-03-10 ENCOUNTER — Encounter: Payer: Self-pay | Admitting: Cardiology

## 2016-03-10 VITALS — BP 126/70 | HR 45 | Ht 72.0 in | Wt 181.6 lb

## 2016-03-10 DIAGNOSIS — Z951 Presence of aortocoronary bypass graft: Secondary | ICD-10-CM | POA: Diagnosis not present

## 2016-03-10 DIAGNOSIS — R001 Bradycardia, unspecified: Secondary | ICD-10-CM

## 2016-03-10 DIAGNOSIS — I2510 Atherosclerotic heart disease of native coronary artery without angina pectoris: Secondary | ICD-10-CM

## 2016-03-10 MED ORDER — ATORVASTATIN CALCIUM 40 MG PO TABS: 40.0000 mg | ORAL_TABLET | Freq: Every day | ORAL | Status: DC

## 2016-03-10 NOTE — Patient Instructions (Signed)

## 2016-03-10 NOTE — Progress Notes (Signed)
[facility]. [address] [place], [place]  [address] Phone: [phone] Fax:  [phone]  Date:  03/10/2016   ID:  Joseph Werner, DOB Jan 19, 1935, MRN [id number]  PCP:  Gennette Pac, MD   History of Present Illness: Joseph Werner is a 81 y.o. male with CAD with CABG in 1999, asymptomatic prior to CABG. Went to PCP and ordered a stress test at the time. In the middle of the test he stopped and Dr. Leonia Reeves performed cath at the time. Went to CABG. No subsequent caths. Had been having ETT yearly. We discussed guidelines in that a yearly stress test evaluation was not necessary unless symptoms have changed. He was concerned about this because he has been having a stress test every year, exercise treadmill test, and then he did not have any symptoms prior to his bypass surgery. I also fully explained to him that it would not be unreasonable to forego stress testing at this point. He is willing to forego stress testing at this point. LIMA graft. He did not have any vein grafts. No palpitations, no syncope, no dizziness. No CVA. Never tobacco.   He has been exercising 5 days a week. He is no longer jogging. Walks on treadmill. Back pain occasionally.    Wt Readings from Last 3 Encounters:  03/10/16 181 lb 9.6 oz (82.373 kg)  09/16/15 179 lb 6.4 oz (81.375 kg)  08/31/15 173 lb (78.472 kg)     Past Medical History  Diagnosis Date  . Prostate cancer ([place])     H/O  . Dyslipidemia   . Hypothyroid   . Eczema   . Anemia     macrocytic anemia  . Bradycardia     chronic  . Osteopenia     on bone density test from orthopedist Dr. Nelva Bush, bone density test in July 2010 showed T score-1.1 in the lumbar spine and 2.1 in the femur. Bone density in August 2012 showed T score 2.3 in the femur and 2.2 in the forearm  . Hyperlipidemia   . Arthritis   . CAD (coronary artery disease)   . Prostate cancer [facility])     followed by urology Dr. Diona Fanti  . Macrocytosis     with normal  bone marrow biopsy in 2006 may be benign varient, evaluated in the pastby hematologistDr. Benay Spice  . Loss of weight   . Postsurgical aortocoronary bypass status     Past Surgical History  Procedure Laterality Date  . Coronary artery bypass graft  1998  . Prostatectomy    . Cholecystectomy  1982  . Appendectomy    . Inguinal hernia repair Right 11/18/2012    Procedure: HERNIA REPAIR INGUINAL ADULT;  Surgeon: Merrie Roof, MD;  Location: [place];  Service: General;  Laterality: Right;  . Insertion of mesh Right 11/18/2012    Procedure: INSERTION OF MESH;  Surgeon: Merrie Roof, MD;  Location: [place];  Service: General;  Laterality: Right;  . Hernia repair      Current Outpatient Prescriptions  Medication Sig Dispense Refill  . Ascorbic Acid (VITAMIN C) 1000 MG tablet Take 2,000 mg by mouth daily.    Marland Kitchen aspirin EC 81 MG tablet Take 81 mg by mouth daily.    Marland Kitchen atorvastatin (LIPITOR) 40 MG tablet TAKE 1 TABLET (40 MG TOTAL) BY MOUTH DAILY. 30 tablet 1  . calcium carbonate (OS-CAL) 600 MG TABS tablet Take 600 mg by mouth daily.    Marland Kitchen  Cholecalciferol (VITAMIN D) 2000 UNITS CAPS Take 2,000 Units by mouth daily.     . Coenzyme Q10 Liposomal 100 MG/ML LIQD Take 100 mg by mouth daily.    . Flaxseed, Linseed, (FLAX SEED OIL PO) Take 1 capsule by mouth daily.    Marland Kitchen GLUCOSAMINE-CHONDROITIN PO Take 1 tablet by mouth 3 (three) times daily.    . hydrocortisone valerate cream (WESTCORT) 0.2 % Apply 1 application topically 2 (two) times daily.     Marland Kitchen ibuprofen (ADVIL,MOTRIN) 200 MG tablet Take 400 mg by mouth every 6 (six) hours as needed for pain.    Marland Kitchen levothyroxine (SYNTHROID, LEVOTHROID) 100 MCG tablet Take 100 mcg by mouth daily.    . Selenium 200 MCG CAPS Take 200 mcg by mouth daily.     No current facility-administered medications for this visit.    Allergies:   No Known Allergies  Social History:  The patient  reports that he has never smoked. He does not have any smokeless tobacco history on  file. He reports that he drinks about 3.0 oz of alcohol per week. He reports that he does not use illicit drugs.   Family History  Problem Relation Age of Onset  . Heart disease Father   . Hypertension Father   . Prostate cancer Father   . Lung cancer Mother   . Diabetes Brother   . Brain cancer Brother   . Breast cancer Sister     ROS:  Please see the history of present illness.   No syncope, no bleeding, no chest pain, no SOB.   All other systems reviewed and negative.   PHYSICAL EXAM: VS:  BP 126/70 mmHg  Pulse 45  Ht 6' (1.829 m)  Wt 181 lb 9.6 oz (82.373 kg)  BMI 24.62 kg/m2 Well nourished, well developed, in no acute distress HEENT: normal, [place]/AT, EOMI Neck: no JVD, normal carotid upstroke, no bruit Cardiac:  normal S1, S2; RRR; no murmur Lungs:  clear to auscultation bilaterally, no wheezing, rhonchi or rales Abd: soft, nontender, no hepatomegaly, no bruits Ext: trace ankle edema, 2+ distal pulses Skin: warm and dry GU: deferred Neuro: no focal abnormalities noted, AAO x 3  EKG:   EKG was ordered today-sinus rhythm heart rate 94 with PVCs, 4 seen on current EKG. 02/26/15-sinus bradycardia rate 53. Prior EKG-48 beats per min, other wise normal.  LDL 51 on 01/07/14   ASSESSMENT AND PLAN:  1. Coronary artery disease-status post bypass surgery 1999-doing very well, asymptomatic. No shortness of breath. Exercises frequently. Doing very well. Secondary prevention. Aspirin. No beta blocker secondary to bradycardia. 2. Hyperlipidemia-excellent control with atorvastatin-LDL at goal of less than 70, currently 51 on last check. Dr. Rex Kras has been following his lipids. 3. Bradycardia-chronic. Asymptomatic, no syncope. Avoid beta blockers. 4. Edema-can try compression hose. Dependent edema. Minor. Salt restriction. He is on no antihypertensive that could cause edema. 5. Chronic anemia followed by Dr. Benay Spice on a yearly basis. 6. One year follow up. Continue with excellent  exercise.  Signed, Candee Furbish, MD [facility]  03/10/2016 8:20 AM

## 2016-06-02 DIAGNOSIS — Z79899 Other long term (current) drug therapy: Secondary | ICD-10-CM | POA: Diagnosis not present

## 2016-06-02 DIAGNOSIS — L309 Dermatitis, unspecified: Secondary | ICD-10-CM | POA: Diagnosis not present

## 2016-06-02 DIAGNOSIS — E039 Hypothyroidism, unspecified: Secondary | ICD-10-CM | POA: Diagnosis not present

## 2016-06-02 DIAGNOSIS — I25119 Atherosclerotic heart disease of native coronary artery with unspecified angina pectoris: Secondary | ICD-10-CM | POA: Diagnosis not present

## 2016-06-02 DIAGNOSIS — E785 Hyperlipidemia, unspecified: Secondary | ICD-10-CM | POA: Diagnosis not present

## 2016-06-02 DIAGNOSIS — M8580 Other specified disorders of bone density and structure, unspecified site: Secondary | ICD-10-CM | POA: Diagnosis not present

## 2016-06-02 DIAGNOSIS — Z8546 Personal history of malignant neoplasm of prostate: Secondary | ICD-10-CM | POA: Diagnosis not present

## 2016-06-02 DIAGNOSIS — M899 Disorder of bone, unspecified: Secondary | ICD-10-CM | POA: Diagnosis not present

## 2016-06-02 DIAGNOSIS — Z0000 Encounter for general adult medical examination without abnormal findings: Secondary | ICD-10-CM | POA: Diagnosis not present

## 2016-06-02 DIAGNOSIS — Z23 Encounter for immunization: Secondary | ICD-10-CM | POA: Diagnosis not present

## 2016-06-02 DIAGNOSIS — D7589 Other specified diseases of blood and blood-forming organs: Secondary | ICD-10-CM | POA: Diagnosis not present

## 2016-08-07 DIAGNOSIS — H2513 Age-related nuclear cataract, bilateral: Secondary | ICD-10-CM | POA: Diagnosis not present

## 2016-09-14 ENCOUNTER — Other Ambulatory Visit (HOSPITAL_BASED_OUTPATIENT_CLINIC_OR_DEPARTMENT_OTHER): Payer: Medicare Other

## 2016-09-14 ENCOUNTER — Telehealth: Payer: Self-pay | Admitting: Oncology

## 2016-09-14 ENCOUNTER — Ambulatory Visit (HOSPITAL_BASED_OUTPATIENT_CLINIC_OR_DEPARTMENT_OTHER): Payer: Medicare Other | Admitting: Oncology

## 2016-09-14 VITALS — BP 143/58 | HR 49 | Temp 97.8°F | Resp 18 | Ht 72.0 in | Wt 184.9 lb

## 2016-09-14 DIAGNOSIS — D649 Anemia, unspecified: Secondary | ICD-10-CM

## 2016-09-14 DIAGNOSIS — R001 Bradycardia, unspecified: Secondary | ICD-10-CM

## 2016-09-14 DIAGNOSIS — D7589 Other specified diseases of blood and blood-forming organs: Secondary | ICD-10-CM

## 2016-09-14 DIAGNOSIS — R718 Other abnormality of red blood cells: Secondary | ICD-10-CM | POA: Diagnosis not present

## 2016-09-14 LAB — CBC WITH DIFFERENTIAL/PLATELET
BASO%: 0.2 % (ref 0.0–2.0)
Basophils Absolute: 0 10*3/uL (ref 0.0–0.1)
EOS%: 3.9 % (ref 0.0–7.0)
Eosinophils Absolute: 0.2 10*3/uL (ref 0.0–0.5)
HCT: 39.6 % (ref 38.4–49.9)
HGB: 13.4 g/dL (ref 13.0–17.1)
LYMPH%: 24 % (ref 14.0–49.0)
MCH: 35.4 pg — ABNORMAL HIGH (ref 27.2–33.4)
MCHC: 33.8 g/dL (ref 32.0–36.0)
MCV: 104.5 fL — ABNORMAL HIGH (ref 79.3–98.0)
MONO#: 0.5 10*3/uL (ref 0.1–0.9)
MONO%: 12.2 % (ref 0.0–14.0)
NEUT#: 2.4 10*3/uL (ref 1.5–6.5)
NEUT%: 59.7 % (ref 39.0–75.0)
Platelets: 154 10*3/uL (ref 140–400)
RBC: 3.79 10*6/uL — ABNORMAL LOW (ref 4.20–5.82)
RDW: 11.9 % (ref 11.0–14.6)
WBC: 4.1 10*3/uL (ref 4.0–10.3)
lymph#: 1 10*3/uL (ref 0.9–3.3)

## 2016-09-14 NOTE — Progress Notes (Signed)
  [place] OFFICE PROGRESS NOTE   Diagnosis: Red cell macrocytosis  INTERVAL HISTORY:   Mr. Winner returns as scheduled. He feels well. Good appetite and energy level. No complaint.  Objective:  Vital signs in last 24 hours:  Blood pressure (!) 143/58, pulse (!) 49, temperature 97.8 F (36.6 C), temperature source Oral, resp. rate 18, height 6' (1.829 m), weight 184 lb 14.4 oz (83.9 kg), SpO2 100 %.    HEENT: Neck without mass Lymphatics: No cervical, supraclavicular, or inguinal nodes. "Shotty "bilateral axillary nodes. Resp: Lungs clear bilaterally Cardio: Regular rate and rhythm GI: No hepatosplenomegaly, slight soft fullness in the left mid abdomen Vascular: No leg edema   Lab Results:  Lab Results  Component Value Date   WBC 4.1 09/14/2016   HGB 13.4 09/14/2016   HCT 39.6 09/14/2016   MCV 104.5 (H) 09/14/2016   PLT 154 09/14/2016   NEUTROABS 2.4 09/14/2016     Medications: I have reviewed the patient's current medications.  Assessment/Plan: 1. 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 A bone marrow biopsy in 2006 was nondiagnostic. 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 2. Chronic bradycardia. 3. History of a Borderline low white count 4. Right inguinal hernia repair 11/18/2012     Disposition:  Mr. Bollman appears unchanged. There is persistent red cell macrocytosis, no anemia. He would like to continue follow-up in the Hematology clinic. He will return for an office visit and CBC in one year.  15 minutes were spent with the patient today. The majority of the time was used for counseling and coordination of care.  Betsy Coder, MD  09/14/2016  11:21 AM

## 2016-09-14 NOTE — Telephone Encounter (Signed)
Appointments scheduled per 1/18 LOS. Patient given AVS report and calendars with future scheduled appointments. °

## 2017-01-27 ENCOUNTER — Other Ambulatory Visit: Payer: Self-pay | Admitting: Cardiology

## 2017-01-29 ENCOUNTER — Other Ambulatory Visit: Payer: Self-pay | Admitting: Cardiology

## 2017-02-01 ENCOUNTER — Other Ambulatory Visit: Payer: Self-pay | Admitting: Cardiology

## 2017-02-02 NOTE — Telephone Encounter (Signed)
Medication Detail    Disp Refills Start End   atorvastatin (LIPITOR) 40 MG tablet 30 tablet 1 01/29/2017    Sig - Route: TAKE 1 TABLET (40 MG TOTAL) BY MOUTH DAILY AT 6 PM. - Oral   Notes to Pharmacy: Please keep upcoming appointment for future refills. Thank you   E-Prescribing Status: Receipt confirmed by pharmacy (01/29/2017 11:03 AM EDT)   Pharmacy   [id number] [place], [place]

## 2017-03-12 ENCOUNTER — Encounter: Payer: Self-pay | Admitting: Cardiology

## 2017-03-12 ENCOUNTER — Ambulatory Visit (INDEPENDENT_AMBULATORY_CARE_PROVIDER_SITE_OTHER): Payer: Medicare Other | Admitting: Cardiology

## 2017-03-12 VITALS — BP 126/60 | HR 45 | Ht 72.0 in | Wt 184.4 lb

## 2017-03-12 DIAGNOSIS — Z951 Presence of aortocoronary bypass graft: Secondary | ICD-10-CM | POA: Diagnosis not present

## 2017-03-12 DIAGNOSIS — I2510 Atherosclerotic heart disease of native coronary artery without angina pectoris: Secondary | ICD-10-CM | POA: Diagnosis not present

## 2017-03-12 DIAGNOSIS — R001 Bradycardia, unspecified: Secondary | ICD-10-CM

## 2017-03-12 NOTE — Patient Instructions (Signed)

## 2017-03-12 NOTE — Progress Notes (Signed)
[facility]. [address], [address] [place], [place]  [address] Phone: [phone] Fax:  [phone]  Date:  03/12/2017   ID:  Joseph Werner, DOB [birth year], MRN [id number]  PCP:  Hulan Fess, MD   History of Present Illness: Joseph Werner is a 82 y.o. male with CAD with CABG in 1999, asymptomatic prior to CABG. Went to PCP and ordered a stress test at the time. In the middle of the test he stopped and Dr. Leonia Reeves performed cath at the time. Went to CABG. No subsequent caths. Had been having ETT yearly. We discussed guidelines in that a yearly stress test evaluation was not necessary unless symptoms have changed. He was concerned about this because he has been having a stress test every year, exercise treadmill test, and then he did not have any symptoms prior to his bypass surgery. I also fully explained to him that it would not be unreasonable to forego stress testing at this point. He is willing to forego stress testing at this point. LIMA graft. He did not have any vein grafts. No palpitations, no syncope, no dizziness. No CVA. Never tobacco.   He has been exercising 5 days a week, 20 min at a time. He is no longer jogging. Walks on treadmill. Back pain occasionally. Gave blood and it took 2 weeks to get over that.     Wt Readings from Last 3 Encounters:  03/12/17 184 lb 6.4 oz (83.6 kg)  09/14/16 184 lb 14.4 oz (83.9 kg)  03/10/16 181 lb 9.6 oz (82.4 kg)     Past Medical History:  Diagnosis Date  . Anemia    macrocytic anemia  . Arthritis   . Bradycardia    chronic  . CAD (coronary artery disease)   . Dyslipidemia   . Eczema   . Hyperlipidemia   . Hypothyroid   . Loss of weight   . Macrocytosis    with normal bone marrow biopsy in 2006 may be benign varient, evaluated in the pastby hematologistDr. Benay Spice  . Osteopenia    on bone density test from orthopedist Dr. Nelva Bush, bone density test in July 2010 showed T score-1.1 in the lumbar spine and 2.1 in the femur.  Bone density in August 2012 showed T score 2.3 in the femur and 2.2 in the forearm  . Postsurgical aortocoronary bypass status   . Prostate cancer ([place])    H/O  . Prostate cancer [facility])    followed by urology Dr. Diona Fanti    Past Surgical History:  Procedure Laterality Date  . APPENDECTOMY    . CHOLECYSTECTOMY  1982  . CORONARY ARTERY BYPASS GRAFT  1998  . HERNIA REPAIR    . INGUINAL HERNIA REPAIR Right 11/18/2012   Procedure: HERNIA REPAIR INGUINAL ADULT;  Surgeon: Merrie Roof, MD;  Location: [place];  Service: General;  Laterality: Right;  . INSERTION OF MESH Right 11/18/2012   Procedure: INSERTION OF MESH;  Surgeon: Merrie Roof, MD;  Location: [place];  Service: General;  Laterality: Right;  . PROSTATECTOMY      Current Outpatient Prescriptions  Medication Sig Dispense Refill  . Ascorbic Acid (VITAMIN C) 1000 MG tablet Take 2,000 mg by mouth daily.    Marland Kitchen aspirin EC 81 MG tablet Take 81 mg by mouth daily.    Marland Kitchen atorvastatin (LIPITOR) 40 MG tablet TAKE 1 TABLET (40 MG TOTAL) BY MOUTH DAILY AT 6 PM. 30 tablet 1  . calcium carbonate (  OS-CAL) 600 MG TABS tablet Take 600 mg by mouth daily.    . Cholecalciferol (VITAMIN D) 2000 UNITS CAPS Take 2,000 Units by mouth daily.     . Coenzyme Q10 Liposomal 100 MG/ML LIQD Take 100 mg by mouth daily.    . Flaxseed, Linseed, (FLAX SEED OIL PO) Take 1 capsule by mouth daily.    Marland Kitchen GLUCOSAMINE-CHONDROITIN PO Take 1 tablet by mouth 3 (three) times daily.    . hydrocortisone valerate cream (WESTCORT) 0.2 % Apply 1 application topically 2 (two) times daily.     Marland Kitchen ibuprofen (ADVIL,MOTRIN) 200 MG tablet Take 400 mg by mouth every 6 (six) hours as needed for pain.    Marland Kitchen levothyroxine (SYNTHROID, LEVOTHROID) 100 MCG tablet Take 100 mcg by mouth daily.    . Selenium 200 MCG CAPS Take 200 mcg by mouth daily.     No current facility-administered medications for this visit.     Allergies:   No Known Allergies  Social History:  The patient  reports that  he has never smoked. He has never used smokeless tobacco. He reports that he drinks about 3.0 oz of alcohol per week . He reports that he does not use drugs.   Family History  Problem Relation Age of Onset  . Heart disease Father   . Hypertension Father   . Prostate cancer Father   . Lung cancer Mother   . Diabetes Brother   . Brain cancer Brother   . Breast cancer Sister     ROS:  Please see the history of present illness.   No syncope, no bleeding, no chest pain, no SOB.   All other systems reviewed and negative.   PHYSICAL EXAM: VS:  BP 126/60   Pulse (!) 45   Ht 6' (1.829 m)   Wt 184 lb 6.4 oz (83.6 kg)   BMI 25.01 kg/m  GEN: Well nourished, well developed, in no acute distress  HEENT: normal  Neck: no JVD, carotid bruits, or masses Cardiac: brady reg; no murmurs, rubs, or gallops, mild lower extremity edema  Respiratory:  clear to auscultation bilaterally, normal work of breathing GI: soft, nontender, nondistended, + BS MS: no deformity or atrophy  Skin: warm and dry, no rash Neuro:  Alert and Oriented x 3, Strength and sensation are intact Psych: euthymic mood, full affect   EKG:   EKG was ordered today-03/12/17-sinus bradycardia rate 45 no other significant abnormalities-prior sinus rhythm heart rate 94 with PVCs, 4 seen on current EKG. 02/26/15-sinus bradycardia rate 53. Prior EKG-48 beats per min, other wise normal.  LDL 63 on 06/02/16 HDL 75, creatinine 1.1.   ASSESSMENT AND PLAN:  1. Coronary artery disease-status post bypass surgery 1999-doing very well, asymptomatic. Continue with exercise regimen, asymptomatic with this. No anginal symptoms. Secondary prevention. Aspirin. No beta blocker secondary to bradycardia. 2. Hyperlipidemia-excellent control with atorvastatin-LDL at goal of less than 70, Dr. Rex Kras has been following his lipids. No side effects with medications. 3. Bradycardia-chronic. Asymptomatic, no syncope. Avoid beta blockers. No high-risk symptoms with  this. I'm fine with this. Heart rate increased when he was upright. 4. Edema-can try compression hose. Dependent edema. Minor. Salt restriction. He is on no antihypertensive that could cause edema. In the morning, edema is gone. 5. Chronic anemia followed by Dr. Benay Spice on a yearly basis. I suggested that he not to donate blood at this point given that it took him 2 weeks to recover. 6. One year follow up.   Signed, Candee Furbish,  MD [facility]  03/12/2017 9:27 AM

## 2017-06-13 DIAGNOSIS — D7589 Other specified diseases of blood and blood-forming organs: Secondary | ICD-10-CM | POA: Diagnosis not present

## 2017-06-13 DIAGNOSIS — E039 Hypothyroidism, unspecified: Secondary | ICD-10-CM | POA: Diagnosis not present

## 2017-06-13 DIAGNOSIS — M859 Disorder of bone density and structure, unspecified: Secondary | ICD-10-CM | POA: Diagnosis not present

## 2017-06-13 DIAGNOSIS — Z8546 Personal history of malignant neoplasm of prostate: Secondary | ICD-10-CM | POA: Diagnosis not present

## 2017-06-13 DIAGNOSIS — M899 Disorder of bone, unspecified: Secondary | ICD-10-CM | POA: Diagnosis not present

## 2017-06-13 DIAGNOSIS — E663 Overweight: Secondary | ICD-10-CM | POA: Diagnosis not present

## 2017-06-13 DIAGNOSIS — I25119 Atherosclerotic heart disease of native coronary artery with unspecified angina pectoris: Secondary | ICD-10-CM | POA: Diagnosis not present

## 2017-06-13 DIAGNOSIS — E785 Hyperlipidemia, unspecified: Secondary | ICD-10-CM | POA: Diagnosis not present

## 2017-06-13 DIAGNOSIS — Z23 Encounter for immunization: Secondary | ICD-10-CM | POA: Diagnosis not present

## 2017-06-13 DIAGNOSIS — Z79899 Other long term (current) drug therapy: Secondary | ICD-10-CM | POA: Diagnosis not present

## 2017-06-13 DIAGNOSIS — Z0000 Encounter for general adult medical examination without abnormal findings: Secondary | ICD-10-CM | POA: Diagnosis not present

## 2017-08-14 DIAGNOSIS — M8588 Other specified disorders of bone density and structure, other site: Secondary | ICD-10-CM | POA: Diagnosis not present

## 2017-08-16 DIAGNOSIS — S0502XA Injury of conjunctiva and corneal abrasion without foreign body, left eye, initial encounter: Secondary | ICD-10-CM | POA: Diagnosis not present

## 2017-08-22 DIAGNOSIS — H18832 Recurrent erosion of cornea, left eye: Secondary | ICD-10-CM | POA: Diagnosis not present

## 2017-08-23 DIAGNOSIS — H18832 Recurrent erosion of cornea, left eye: Secondary | ICD-10-CM | POA: Diagnosis not present

## 2017-08-29 DIAGNOSIS — H18832 Recurrent erosion of cornea, left eye: Secondary | ICD-10-CM | POA: Diagnosis not present

## 2017-09-14 ENCOUNTER — Inpatient Hospital Stay: Payer: Medicare Other | Attending: Oncology | Admitting: Oncology

## 2017-09-14 ENCOUNTER — Encounter: Payer: Self-pay | Admitting: Oncology

## 2017-09-14 ENCOUNTER — Telehealth: Payer: Self-pay | Admitting: Oncology

## 2017-09-14 ENCOUNTER — Inpatient Hospital Stay: Payer: Medicare Other

## 2017-09-14 VITALS — BP 141/58 | HR 47 | Temp 97.0°F | Resp 18 | Ht 72.0 in | Wt 181.6 lb

## 2017-09-14 DIAGNOSIS — D7589 Other specified diseases of blood and blood-forming organs: Secondary | ICD-10-CM

## 2017-09-14 LAB — CBC WITH DIFFERENTIAL/PLATELET
Basophils Absolute: 0 10*3/uL (ref 0.0–0.1)
Basophils Relative: 0 %
Eosinophils Absolute: 0.1 10*3/uL (ref 0.0–0.5)
Eosinophils Relative: 3 %
HCT: 40.1 % (ref 38.4–49.9)
Hemoglobin: 13.5 g/dL (ref 13.0–17.1)
Lymphocytes Relative: 33 %
Lymphs Abs: 1.3 10*3/uL (ref 0.9–3.3)
MCH: 35.8 pg — ABNORMAL HIGH (ref 27.2–33.4)
MCHC: 33.7 g/dL (ref 32.0–36.0)
MCV: 106.4 fL — ABNORMAL HIGH (ref 79.3–98.0)
Monocytes Absolute: 0.5 10*3/uL (ref 0.1–0.9)
Monocytes Relative: 12 %
Neutro Abs: 2 10*3/uL (ref 1.5–6.5)
Neutrophils Relative %: 52 %
Platelets: 167 10*3/uL (ref 140–400)
RBC: 3.77 MIL/uL — ABNORMAL LOW (ref 4.20–5.82)
RDW: 11.8 % (ref 11.0–15.6)
WBC: 4 10*3/uL (ref 4.0–10.3)

## 2017-09-14 NOTE — Telephone Encounter (Signed)
Gave patient avs and calendar with appts per 11/8 los.  °

## 2017-09-14 NOTE — Progress Notes (Signed)
  [facility] OFFICE PROGRESS NOTE   Diagnosis: Red cell macrocytosis  INTERVAL HISTORY:   Mr. Salguero returns as scheduled.  He feels well.  He recently developed a corneal erosion.  He is followed by ophthalmology.  He reports improvement with a topical antibiotic and ointment.  No other complaint.  Objective:  Vital signs in last 24 hours:  Blood pressure (!) 141/58, pulse (!) 47, temperature (!) 97 F (36.1 C), temperature source Oral, resp. rate 18, height 6' (1.829 m), weight 181 lb 9.6 oz (82.4 kg), SpO2 100 %.    HEENT: Neck without mass Lymphatics: No cervical, supraclavicular, or inguinal nodes.  "Shotty "bilateral axillary nodes Resp: Lungs clear bilaterally Cardio: Regular rate and rhythm GI: No hepatosplenomegaly, soft smooth fullness in the left mid abdomen, nontender Vascular: Trace lower leg/ankle edema bilaterally  Skin: Brown discoloration at the pretibial area bilaterally    Lab Results:  Lab Results  Component Value Date   WBC 4.0 09/14/2017   HGB 13.5 09/14/2017   HCT 40.1 09/14/2017   MCV 106.4 (H) 09/14/2017   PLT 167 09/14/2017   NEUTROABS 2.0 09/14/2017    CMP     Component Value Date/Time   NA 143 11/11/2012 0833   K 4.4 11/11/2012 0833   CL 108 11/11/2012 0833   CO2 29 11/11/2012 0833   GLUCOSE 95 11/11/2012 0833   BUN 25 (H) 11/11/2012 0833   CREATININE 1.11 11/11/2012 0833   CALCIUM 9.6 11/11/2012 0833   PROT 6.4 01/07/2014 0735   ALBUMIN 3.9 01/07/2014 0735   AST 29 01/07/2014 0735   ALT 22 01/07/2014 0735   ALKPHOS 26 (L) 01/07/2014 0735   BILITOT 1.6 (H) 01/07/2014 0735   GFRNONAA 63 (L) 11/11/2012 0833   GFRAA 73 (L) 11/11/2012 0131     Medications: I have reviewed the patient's current medications.   Assessment/Plan: 1. 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 A bone marrow biopsy in 2006 was nondiagnostic. The red cell macrocytosis may be related to early  myelodysplasia. 2. Chronic bradycardia. 3. History of a Borderline low white count 4. Right inguinal hernia repair 11/18/2012   Disposition: Mr. Candela has persistent red cell macrocytosis.  He is not anemic.  He will return for an office visit and CBC in 1 year.  15 minutes were spent with the patient today.  The majority of the time was used for counseling and coordination of care. Betsy Coder, MD  09/14/2017  12:06 PM

## 2017-10-06 DIAGNOSIS — S60811A Abrasion of right wrist, initial encounter: Secondary | ICD-10-CM | POA: Diagnosis not present

## 2017-10-06 DIAGNOSIS — S61551A Open bite of right wrist, initial encounter: Secondary | ICD-10-CM | POA: Diagnosis not present

## 2017-10-06 DIAGNOSIS — S60812A Abrasion of left wrist, initial encounter: Secondary | ICD-10-CM | POA: Diagnosis not present

## 2017-10-06 DIAGNOSIS — W540XXA Bitten by dog, initial encounter: Secondary | ICD-10-CM | POA: Diagnosis not present

## 2017-10-06 DIAGNOSIS — S61552A Open bite of left wrist, initial encounter: Secondary | ICD-10-CM | POA: Diagnosis not present

## 2017-10-17 DIAGNOSIS — H2513 Age-related nuclear cataract, bilateral: Secondary | ICD-10-CM | POA: Diagnosis not present

## 2018-02-21 ENCOUNTER — Telehealth: Payer: Self-pay | Admitting: Cardiology

## 2018-02-21 NOTE — Telephone Encounter (Signed)
Confirmed appt and advised if pt has had recent lab at his PCP office we would not need it repeated.  Wife doesn't know when last lab was completed at PCP office.  Also advised if any lab work needed here, it can be drawn the day of his appt.  She states understanding.

## 2018-02-21 NOTE — Telephone Encounter (Signed)
New Message:       Pt's wife is calling to confirm appt and would like to know if he is needing labs done on 03/12/18 as well the day of appt

## 2018-03-12 ENCOUNTER — Ambulatory Visit (INDEPENDENT_AMBULATORY_CARE_PROVIDER_SITE_OTHER): Payer: Medicare Other | Admitting: Cardiology

## 2018-03-12 ENCOUNTER — Encounter: Payer: Self-pay | Admitting: Cardiology

## 2018-03-12 VITALS — BP 110/62 | HR 49 | Ht 72.0 in | Wt 183.0 lb

## 2018-03-12 DIAGNOSIS — R001 Bradycardia, unspecified: Secondary | ICD-10-CM | POA: Diagnosis not present

## 2018-03-12 DIAGNOSIS — Z951 Presence of aortocoronary bypass graft: Secondary | ICD-10-CM

## 2018-03-12 DIAGNOSIS — I2510 Atherosclerotic heart disease of native coronary artery without angina pectoris: Secondary | ICD-10-CM | POA: Diagnosis not present

## 2018-03-12 NOTE — Progress Notes (Signed)
[place]. [address], [address], [place]  [address] Phone: [phone] Fax:  [phone]  Date:  03/12/2018   ID:  Joseph Werner, DOB 06/04/1935, MRN [id number]  PCP:  Hulan Fess, MD   History of Present Illness: Joseph Werner is a 83 y.o. male with CAD with CABG in 1999, asymptomatic prior to CABG. Went to PCP and ordered a stress test at the time. In the middle of the test he stopped and Dr. Leonia Reeves performed cath at the time. Went to CABG. No subsequent caths. Had been having ETT yearly. We discussed guidelines in that a yearly stress test evaluation was not necessary unless symptoms have changed. He was concerned about this because he has been having a stress test every year, exercise treadmill test, and then he did not have any symptoms prior to his bypass surgery. I also fully explained to him that it would not be unreasonable to forego stress testing at this point. He is willing to forego stress testing at this point. LIMA graft. He did not have any vein grafts. No palpitations, no syncope, no dizziness. No CVA. Never tobacco.   He has been exercising 5 days a week, 20 min at a time. He is no longer jogging. Walks on treadmill. Back pain occasionally. Gave blood and it took 2 weeks to get over that.   03/12/18 - left ankle swelling. ?a result of prostate surgery, caused legs to swell, left. Mornings gone.  Not having any anginal symptoms, no syncope, no bleeding, no orthopnea, no PND.  Overall doing quite well with his medications.  Still getting lab work from Dr. Rex Kras.   Wt Readings from Last 3 Encounters:  03/12/18 183 lb (83 kg)  09/14/17 181 lb 9.6 oz (82.4 kg)  03/12/17 184 lb 6.4 oz (83.6 kg)     Past Medical History:  Diagnosis Date  . Anemia    macrocytic anemia  . Arthritis   . Bradycardia    chronic  . CAD (coronary artery disease)   . Dyslipidemia   . Eczema   . Hyperlipidemia   . Hypothyroid   . Loss of weight   . Macrocytosis    with  normal bone marrow biopsy in 2006 may be benign varient, evaluated in the pastby hematologistDr. Benay Spice  . Osteopenia    on bone density test from orthopedist Dr. Nelva Bush, bone density test in July 2010 showed T score-1.1 in the lumbar spine and 2.1 in the femur. Bone density in August 2012 showed T score 2.3 in the femur and 2.2 in the forearm  . Postsurgical aortocoronary bypass status   . Prostate cancer ([place])    H/O  . Prostate cancer [facility])    followed by urology Dr. Diona Fanti    Past Surgical History:  Procedure Laterality Date  . APPENDECTOMY    . CHOLECYSTECTOMY  1982  . CORONARY ARTERY BYPASS GRAFT  1998  . HERNIA REPAIR    . INGUINAL HERNIA REPAIR Right 11/18/2012   Procedure: HERNIA REPAIR INGUINAL ADULT;  Surgeon: Merrie Roof, MD;  Location: [place];  Service: General;  Laterality: Right;  . INSERTION OF MESH Right 11/18/2012   Procedure: INSERTION OF MESH;  Surgeon: Merrie Roof, MD;  Location: [place];  Service: General;  Laterality: Right;  . PROSTATECTOMY      Current Outpatient Medications  Medication Sig Dispense Refill  . Ascorbic Acid (VITAMIN C) 1000 MG tablet Take 2,000 mg by  mouth daily.    Marland Kitchen aspirin EC 81 MG tablet Take 81 mg by mouth daily.    Marland Kitchen atorvastatin (LIPITOR) 40 MG tablet TAKE 1 TABLET (40 MG TOTAL) BY MOUTH DAILY AT 6 PM. 30 tablet 1  . calcium carbonate (OS-CAL) 600 MG TABS tablet Take 600 mg by mouth daily.    . Cholecalciferol (VITAMIN D) 2000 UNITS CAPS Take 2,000 Units by mouth daily.     . Coenzyme Q10 Liposomal 100 MG/ML LIQD Take 100 mg by mouth daily.    . Flaxseed, Linseed, (FLAX SEED OIL PO) Take 1 capsule by mouth daily.    Marland Kitchen GLUCOSAMINE-CHONDROITIN PO Take 1 tablet by mouth 3 (three) times daily.    . hydrocortisone valerate cream (WESTCORT) 0.2 % Apply 1 application topically 2 (two) times daily.     Marland Kitchen ibuprofen (ADVIL,MOTRIN) 200 MG tablet Take 400 mg by mouth every 6 (six) hours as needed for pain.    Marland Kitchen levothyroxine (SYNTHROID,  LEVOTHROID) 100 MCG tablet Take 100 mcg by mouth daily.    . Selenium 200 MCG CAPS Take 200 mcg by mouth daily.     No current facility-administered medications for this visit.     Allergies:   No Known Allergies  Social History:  The patient  reports that he has never smoked. He has never used smokeless tobacco. He reports that he drinks about 3.0 oz of alcohol per week. He reports that he does not use drugs.   Family History  Problem Relation Age of Onset  . Heart disease Father   . Hypertension Father   . Prostate cancer Father   . Lung cancer Mother   . Diabetes Brother   . Brain cancer Brother   . Breast cancer Sister     ROS:  Please see the history of present illness.  All other review of systems negative  PHYSICAL EXAM: VS:  BP 110/62   Pulse (!) 49   Ht 6' (1.829 m)   Wt 183 lb (83 kg)   SpO2 97%   BMI 24.82 kg/m  GEN: Well nourished, well developed, in no acute distress  HEENT: normal  Neck: no JVD, carotid bruits, or masses Cardiac: brady reg, no murmurs, rubs, or gallops,+ ankle edema  Respiratory:  clear to auscultation bilaterally, normal work of breathing GI: soft, nontender, nondistended, + BS MS: no deformity or atrophy  Skin: warm and dry, no rash Neuro:  Alert and Oriented x 3, Strength and sensation are intact Psych: euthymic mood, full affect    EKG:   EKG was ordered today-03/12/17-sinus bradycardia rate 45 no other significant abnormalities-prior sinus rhythm heart rate 94 with PVCs, 4 seen on current EKG. 02/26/15-sinus bradycardia rate 53. Prior EKG-48 beats per min, other wise normal.  LDL 57 on 06/13/2017, ALT 17, hemoglobin 13.5, creatinine 0.9.  ASSESSMENT AND PLAN:  1. Coronary artery disease-status post bypass surgery 1999-No beta blocker secondary to bradycardia.  Doing very well, secondary prevention.  Exercise.  No anginal symptoms. 2. Hyperlipidemia-excellent control with atorvastatin-LDL at goal of less than 70, Dr. Rex Kras has been  following his lipids. No side effects with medications, continue with current regimen.  Reviewed.. 3. Bradycardia-chronic. Asymptomatic, no syncope. Avoid beta blockers. No high-risk symptoms with this. I'm fine with this. Heart rate increased when he was upright.  No changes again today.  No symptoms. 4. Edema-can try compression hose. Dependent edema. Minor. Salt restriction. He is on no antihypertensive that could cause edema. In the morning, edema is  gone.  Chronic complaint, no changes.  He thinks it may have been secondary to prostate surgery when they had to "tie off a lymph node ".  Left leg swelled after that. 5. Chronic anemia followed by Dr. Benay Spice on a yearly basis. I suggested that he not to donate blood at this point given that it took him 2 weeks to recover.  Currently stable.  No changes. 6. One year follow up.   Signed, Candee Furbish, MD [facility]  03/12/2018 8:50 AM

## 2018-03-12 NOTE — Patient Instructions (Signed)

## 2018-05-03 DIAGNOSIS — Z1211 Encounter for screening for malignant neoplasm of colon: Secondary | ICD-10-CM | POA: Diagnosis not present

## 2018-05-03 DIAGNOSIS — K5730 Diverticulosis of large intestine without perforation or abscess without bleeding: Secondary | ICD-10-CM | POA: Diagnosis not present

## 2018-06-26 DIAGNOSIS — Z23 Encounter for immunization: Secondary | ICD-10-CM | POA: Diagnosis not present

## 2018-06-26 DIAGNOSIS — M8580 Other specified disorders of bone density and structure, unspecified site: Secondary | ICD-10-CM | POA: Diagnosis not present

## 2018-06-26 DIAGNOSIS — Z0000 Encounter for general adult medical examination without abnormal findings: Secondary | ICD-10-CM | POA: Diagnosis not present

## 2018-06-26 DIAGNOSIS — Z1389 Encounter for screening for other disorder: Secondary | ICD-10-CM | POA: Diagnosis not present

## 2018-06-26 DIAGNOSIS — I25119 Atherosclerotic heart disease of native coronary artery with unspecified angina pectoris: Secondary | ICD-10-CM | POA: Diagnosis not present

## 2018-06-26 DIAGNOSIS — D7589 Other specified diseases of blood and blood-forming organs: Secondary | ICD-10-CM | POA: Diagnosis not present

## 2018-06-26 DIAGNOSIS — E785 Hyperlipidemia, unspecified: Secondary | ICD-10-CM | POA: Diagnosis not present

## 2018-06-26 DIAGNOSIS — E039 Hypothyroidism, unspecified: Secondary | ICD-10-CM | POA: Diagnosis not present

## 2018-06-26 DIAGNOSIS — Z79899 Other long term (current) drug therapy: Secondary | ICD-10-CM | POA: Diagnosis not present

## 2018-06-26 DIAGNOSIS — Z8546 Personal history of malignant neoplasm of prostate: Secondary | ICD-10-CM | POA: Diagnosis not present

## 2018-09-16 ENCOUNTER — Inpatient Hospital Stay: Payer: Medicare Other

## 2018-09-16 ENCOUNTER — Inpatient Hospital Stay: Payer: Medicare Other | Attending: Oncology | Admitting: Oncology

## 2018-09-16 ENCOUNTER — Telehealth: Payer: Self-pay | Admitting: Oncology

## 2018-09-16 VITALS — BP 135/67 | HR 52 | Temp 97.6°F | Resp 17 | Ht 72.0 in | Wt 183.0 lb

## 2018-09-16 DIAGNOSIS — R001 Bradycardia, unspecified: Secondary | ICD-10-CM | POA: Diagnosis not present

## 2018-09-16 DIAGNOSIS — D7589 Other specified diseases of blood and blood-forming organs: Secondary | ICD-10-CM

## 2018-09-16 LAB — CBC WITH DIFFERENTIAL (CANCER CENTER ONLY)
Abs Immature Granulocytes: 0.01 10*3/uL (ref 0.00–0.07)
Basophils Absolute: 0 10*3/uL (ref 0.0–0.1)
Basophils Relative: 0 %
Eosinophils Absolute: 0.1 10*3/uL (ref 0.0–0.5)
Eosinophils Relative: 1 %
HCT: 37.8 % — ABNORMAL LOW (ref 39.0–52.0)
Hemoglobin: 12.8 g/dL — ABNORMAL LOW (ref 13.0–17.0)
Immature Granulocytes: 0 %
Lymphocytes Relative: 23 %
Lymphs Abs: 1.5 10*3/uL (ref 0.7–4.0)
MCH: 35.3 pg — ABNORMAL HIGH (ref 26.0–34.0)
MCHC: 33.9 g/dL (ref 30.0–36.0)
MCV: 104.1 fL — ABNORMAL HIGH (ref 80.0–100.0)
Monocytes Absolute: 0.6 10*3/uL (ref 0.1–1.0)
Monocytes Relative: 9 %
Neutro Abs: 4.4 10*3/uL (ref 1.7–7.7)
Neutrophils Relative %: 67 %
Platelet Count: 162 10*3/uL (ref 150–400)
RBC: 3.63 MIL/uL — ABNORMAL LOW (ref 4.22–5.81)
RDW: 11.1 % — ABNORMAL LOW (ref 11.5–15.5)
WBC Count: 6.5 10*3/uL (ref 4.0–10.5)
nRBC: 0 % (ref 0.0–0.2)

## 2018-09-16 LAB — RETICULOCYTES
Immature Retic Fract: 7 % (ref 2.3–15.9)
RBC.: 3.63 MIL/uL — ABNORMAL LOW (ref 4.22–5.81)
Retic Count, Absolute: 55.2 10*3/uL (ref 19.0–186.0)
Retic Ct Pct: 1.5 % (ref 0.4–3.1)

## 2018-09-16 NOTE — Progress Notes (Signed)
  [place] OFFICE PROGRESS NOTE   Diagnosis: Red cell macrocytosis  INTERVAL HISTORY:   Joseph Werner turns as scheduled.  He feels well.  No complaint.  Good appetite and energy level.  Objective:  Vital signs in last 24 hours:  Blood pressure 135/67, pulse (!) 52, temperature 97.6 F (36.4 C), temperature source Oral, resp. rate 17, height 6' (1.829 m), weight 183 lb (83 kg), SpO2 100 %.    Lymphatics: No cervical, supraclavicular, axillary, or inguinal nodes Resp: Lungs clear bilaterally Cardio: Regular rate and rhythm GI: No hepatosplenomegaly Vascular: No leg edema  Skin: Brown discoloration at the pretibial areas bilaterally   Lab Results:  Lab Results  Component Value Date   WBC 6.5 09/16/2018   HGB 12.8 (L) 09/16/2018   HCT 37.8 (L) 09/16/2018   MCV 104.1 (H) 09/16/2018   PLT 162 09/16/2018   NEUTROABS 4.4 09/16/2018    Medications: I have reviewed the patient's current medications.   Assessment/Plan: 1. 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 A bone marrow biopsy in 2006 was nondiagnostic. 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 2. Chronic bradycardia. 3. History of a Borderline low white count 4. Right inguinal hernia repair 11/18/2012    Disposition: Joseph Werner appears unchanged.  He has chronic red cell macrocytosis.  The hemoglobin is in the low end of the normal range.  The hemoglobin has not changed significantly for years.  The elevated MCV may be related to chronic liver disease or myelodysplasia.  He would like to continue follow-up in the hematology clinic.  He will return for an office visit in 1 year.  Betsy Coder, MD  09/16/2018  8:37 AM

## 2018-09-16 NOTE — Telephone Encounter (Signed)
Printed calendar and avs. °

## 2018-10-18 DIAGNOSIS — H25013 Cortical age-related cataract, bilateral: Secondary | ICD-10-CM | POA: Diagnosis not present

## 2018-10-18 DIAGNOSIS — H5213 Myopia, bilateral: Secondary | ICD-10-CM | POA: Diagnosis not present

## 2018-10-18 DIAGNOSIS — H2513 Age-related nuclear cataract, bilateral: Secondary | ICD-10-CM | POA: Diagnosis not present

## 2019-01-01 DIAGNOSIS — H5213 Myopia, bilateral: Secondary | ICD-10-CM | POA: Diagnosis not present

## 2019-01-01 DIAGNOSIS — H25013 Cortical age-related cataract, bilateral: Secondary | ICD-10-CM | POA: Diagnosis not present

## 2019-03-14 ENCOUNTER — Telehealth: Payer: Self-pay | Admitting: Cardiology

## 2019-03-14 NOTE — Telephone Encounter (Signed)

## 2019-03-17 ENCOUNTER — Other Ambulatory Visit: Payer: Self-pay

## 2019-03-17 ENCOUNTER — Encounter: Payer: Self-pay | Admitting: Cardiology

## 2019-03-17 ENCOUNTER — Ambulatory Visit (INDEPENDENT_AMBULATORY_CARE_PROVIDER_SITE_OTHER): Payer: Medicare Other | Admitting: Cardiology

## 2019-03-17 VITALS — BP 124/70 | HR 48 | Ht 72.0 in | Wt 184.8 lb

## 2019-03-17 DIAGNOSIS — R001 Bradycardia, unspecified: Secondary | ICD-10-CM

## 2019-03-17 DIAGNOSIS — Z951 Presence of aortocoronary bypass graft: Secondary | ICD-10-CM | POA: Diagnosis not present

## 2019-03-17 DIAGNOSIS — I2510 Atherosclerotic heart disease of native coronary artery without angina pectoris: Secondary | ICD-10-CM

## 2019-03-17 DIAGNOSIS — I10 Essential (primary) hypertension: Secondary | ICD-10-CM

## 2019-03-17 NOTE — Patient Instructions (Signed)
Medication Instructions:  The current medical regimen is effective;  continue present plan and medications.  If you need a refill on your cardiac medications before your next appointment, please call your pharmacy.   Follow-Up: At [facility], you and your health needs are our priority.  As part of our continuing mission to provide you with exceptional heart care, we have created designated Provider Care Teams.  These Care Teams include your primary Cardiologist (physician) and Advanced Practice Providers (APPs -  Physician Assistants and Nurse Practitioners) who all work together to provide you with the care you need, when you need it. You will need a follow up appointment in 12 months.  Please call our office 2 months in advance to schedule this appointment.  You may see Mark Skains, MD or one of the following Advanced Practice Providers on your designated Care Team:   Lori Gerhardt, NP Laura Ingold, NP . Jill McDaniel, NP  Thank you for choosing [facility]!!      

## 2019-03-17 NOTE — Progress Notes (Signed)
Cardiology Office Note:    Date:  03/17/2019   ID:  Joseph [place], DOB 02-Jan-1935, MRN [id number]  PCP:  Hulan Fess, MD  Cardiologist:  Candee Furbish, MD  Electrophysiologist:  None   Referring MD: Hulan Fess, MD   No chief complaint on file. Here for follow-up of CAD  History of Present Illness:    Joseph Werner is a 84 y.o. male with a hx of male with CAD with CABG in 1999, asymptomatic prior to CABG. Went to PCP and ordered a stress test at the time. In the middle of the test he stopped and Dr. Leonia Reeves performed cath at the time. Went to CABG. No subsequent caths. Had been having ETT yearly. We discussed guidelines in that a yearly stress test evaluation was not necessary unless symptoms have changed. He was concerned about this because he has been having a stress test every year, exercise treadmill test, and then he did not have any symptoms prior to his bypass surgery. I also fully explained to him that it would not be unreasonable to forego stress testing at this point. He is willing to forego stress testing at this point. LIMA graft. He did not have any vein grafts.  At a prior visit, had left ankle swelling which he thought was a result of prostate surgery.  Overall doing quite well with lab work from Dr. Hulan Fess.  He still continues to have some lower extremity edema left greater than right but it is better in the mornings as he previously described.  No dizziness he is following his blood pressure closely.  During a prior visit with Dr. Rex Kras his blood pressure was high in the office but has been excellent at home.  Past Medical History:  Diagnosis Date   Anemia    macrocytic anemia   Arthritis    Bradycardia    chronic   CAD (coronary artery disease)    Dyslipidemia    Eczema    Hyperlipidemia    Hypothyroid    Loss of weight    Macrocytosis    with normal bone marrow biopsy in 2006 may be benign varient, evaluated in the pastby  hematologistDr. Benay Spice   Osteopenia    on bone density test from orthopedist Dr. Nelva Bush, bone density test in July 2010 showed T score-1.1 in the lumbar spine and 2.1 in the femur. Bone density in August 2012 showed T score 2.3 in the femur and 2.2 in the forearm   Postsurgical aortocoronary bypass status    Prostate cancer [facility])    H/O   Prostate cancer [facility])    followed by urology Dr. Diona Fanti    Past Surgical History:  Procedure Laterality Date   [place] Right 11/18/2012   Procedure: HERNIA REPAIR INGUINAL ADULT;  Surgeon: Merrie Roof, MD;  Location: [place];  Service: General;  Laterality: Right;   INSERTION OF MESH Right 11/18/2012   Procedure: INSERTION OF MESH;  Surgeon: Merrie Roof, MD;  Location: [place];  Service: General;  Laterality: Right;   PROSTATECTOMY      Current Medications: Current Meds  Medication Sig   Ascorbic Acid (VITAMIN C) 1000 MG tablet Take 2,000 mg by mouth daily.   aspirin EC 81 MG tablet Take 81 mg by mouth daily.   atorvastatin (LIPITOR) 40 MG tablet TAKE  1 TABLET (40 MG TOTAL) BY MOUTH DAILY AT 6 PM.   Coenzyme Q10 Liposomal 100 MG/ML LIQD Take 100 mg by mouth daily.   Flaxseed, Linseed, (FLAX SEED OIL PO) Take 1 capsule by mouth daily.   GLUCOSAMINE-CHONDROITIN PO Take 1 tablet by mouth 2 (two) times daily.    hydrocortisone valerate cream (WESTCORT) 0.2 % Apply 1 application topically 2 (two) times daily.    ibuprofen (ADVIL,MOTRIN) 200 MG tablet Take 400 mg by mouth every 6 (six) hours as needed for pain.   levothyroxine (SYNTHROID, LEVOTHROID) 100 MCG tablet Take 100 mcg by mouth daily.   Selenium 200 MCG CAPS Take 200 mcg by mouth daily.     Allergies:   Patient has no known allergies.   Social History   Socioeconomic History   Marital status: Married    Spouse name: Not on file   Number of  children: Not on file   Years of education: Not on file   Highest education level: Not on file  Occupational History   Not on file  Social Needs   Financial resource strain: Not on file   Food insecurity    Worry: Not on file    Inability: Not on file   Transportation needs    Medical: Not on file    Non-medical: Not on file  Tobacco Use   Smoking status: Never Smoker   Smokeless tobacco: Never Used  Substance and Sexual Activity   Alcohol use: Yes    Alcohol/week: 5.0 standard drinks    Types: 5 Glasses of wine per week    Comment: wine with dinner   Drug use: No   Sexual activity: Not on file  Lifestyle   Physical activity    Days per week: Not on file    Minutes per session: Not on file   Stress: Not on file  Relationships   Social connections    Talks on phone: Not on file    Gets together: Not on file    Attends religious service: Not on file    Active member of club or organization: Not on file    Attends meetings of clubs or organizations: Not on file    Relationship status: Not on file  Other Topics Concern   Not on file  Social History Narrative   Not on file     Family History: The patient's family history includes Brain cancer in his brother; Breast cancer in his sister; Diabetes in his brother; Heart disease in his father; Hypertension in his father; Lung cancer in his mother; Prostate cancer in his father.  ROS:   Please see the history of present illness.    No fevers chills nausea vomiting syncope bleeding all other systems reviewed and are negative.  EKGs/Labs/Other Studies Reviewed:    The following studies were reviewed today: Prior catheterization reviewed  EKG:  EKG is  ordered today.  The ekg ordered today demonstrates marked sinus bradycardia 48, PR interval 170, no changes from prior 03/12/17-sinus bradycardia rate 45 no other significant abnormalities-prior sinus rhythm heart rate 94 with PVCs, 4 seen on current EKG.  02/26/15-sinus bradycardia rate 53. Prior EKG-48 beats per min, other wise normal.  Recent Labs: 09/16/2018: Hemoglobin 12.8; Platelet Count 162  Recent Lipid Panel    Component Value Date/Time   CHOL 135 01/07/2014 0735   TRIG 31.0 01/07/2014 0735   HDL 77.80 01/07/2014 0735   CHOLHDL 2 01/07/2014 0735   VLDL 6.2 01/07/2014 0735   LDLCALC  51 01/07/2014 0735    Physical Exam:    VS:  BP 124/70    Pulse (!) 48    Ht 6' (1.829 m)    Wt 184 lb 12.8 oz (83.8 kg)    SpO2 97%    BMI 25.06 kg/m     Wt Readings from Last 3 Encounters:  03/17/19 184 lb 12.8 oz (83.8 kg)  09/16/18 183 lb (83 kg)  03/12/18 183 lb (83 kg)     GEN:  Well nourished, well developed in no acute distress HEENT: Normal NECK: No JVD; No carotid bruits LYMPHATICS: No lymphadenopathy CARDIAC: brady reg, no murmurs, rubs, gallops RESPIRATORY:  Clear to auscultation without rales, wheezing or rhonchi  ABDOMEN: Soft, non-tender, non-distended MUSCULOSKELETAL:  No edema; No deformity  SKIN: Warm and dry NEUROLOGIC:  Alert and oriented x 3 PSYCHIATRIC:  Normal affect   ASSESSMENT:    1. Atherosclerosis of native coronary artery of native heart without angina pectoris   2. S/P CABG (coronary artery bypass graft)   3. Bradycardia   4. Essential hypertension    PLAN:    In order of problems listed above:  Coronary artery disease -Status post CABG 1999, no vein grafts.  Doing very well.  No beta-blocker secondary to bradycardia in the past.  Secondary prevention.  No anginal symptoms.  Hyperlipidemia -Controlled with atorvastatin, goal of LDL is less than 70.  No myalgias.  Prior LDL 56.  Bradycardia -Chronic and asymptomatic.  Avoid AV nodal blocking agents such as beta-blockers.  He does have chronotropic competence.  48 today.  Chronic anemia -Also followed by hematology on a yearly basis.  At one point he asked about blood donation and he said that it took him 2 weeks to recover.  He should probably  abstain from this in this case.  Hemoglobin 12.8.  Prior edema - He thought it was secondary to prostate surgery when "tie off a lymph node ".  Essential hypertension -Under excellent control.  Occasionally his diastolic pressures will be in the 60s.  He is asymptomatic.  Continue to monitor.  Discussed.   Medication Adjustments/Labs and Tests Ordered: Current medicines are reviewed at length with the patient today.  Concerns regarding medicines are outlined above.  Orders Placed This Encounter  Procedures   EKG 12-Lead   No orders of the defined types were placed in this encounter.   Patient Instructions  Medication Instructions:  The current medical regimen is effective;  continue present plan and medications.  If you need a refill on your cardiac medications before your next appointment, please call your pharmacy.   Follow-Up: At [facility] you and your health needs are our priority.  As part of our continuing mission to provide you with exceptional heart care, we have created designated Provider Care Teams.  These Care Teams include your primary Cardiologist (physician) and Advanced Practice Providers (APPs -  Physician Assistants and Nurse Practitioners) who all work together to provide you with the care you need, when you need it. You will need a follow up appointment in 12 months.  Please call our office 2 months in advance to schedule this appointment.  You may see Candee Furbish, MD or one of the following Advanced Practice Providers on your designated Care Team:   Truitt Merle, NP Cecilie Kicks, NP  Kathyrn Drown, NP  Thank you for choosing [facility]!!        Signed, Candee Furbish, MD  03/17/2019 8:51 AM    [facility]

## 2019-05-13 DIAGNOSIS — Z23 Encounter for immunization: Secondary | ICD-10-CM | POA: Diagnosis not present

## 2019-09-10 ENCOUNTER — Other Ambulatory Visit: Payer: Self-pay | Admitting: Family Medicine

## 2019-09-10 DIAGNOSIS — M8580 Other specified disorders of bone density and structure, unspecified site: Secondary | ICD-10-CM

## 2019-09-18 ENCOUNTER — Inpatient Hospital Stay (HOSPITAL_BASED_OUTPATIENT_CLINIC_OR_DEPARTMENT_OTHER): Payer: Medicare Other | Admitting: Oncology

## 2019-09-18 ENCOUNTER — Other Ambulatory Visit: Payer: Self-pay

## 2019-09-18 ENCOUNTER — Inpatient Hospital Stay: Payer: Medicare Other | Attending: Oncology

## 2019-09-18 VITALS — BP 141/65 | HR 55 | Temp 98.0°F | Resp 16 | Ht 72.0 in | Wt 185.6 lb

## 2019-09-18 DIAGNOSIS — R001 Bradycardia, unspecified: Secondary | ICD-10-CM | POA: Diagnosis not present

## 2019-09-18 DIAGNOSIS — Z79899 Other long term (current) drug therapy: Secondary | ICD-10-CM | POA: Diagnosis not present

## 2019-09-18 DIAGNOSIS — D7589 Other specified diseases of blood and blood-forming organs: Secondary | ICD-10-CM | POA: Insufficient documentation

## 2019-09-18 DIAGNOSIS — D649 Anemia, unspecified: Secondary | ICD-10-CM | POA: Diagnosis not present

## 2019-09-18 LAB — CBC WITH DIFFERENTIAL (CANCER CENTER ONLY)
Abs Immature Granulocytes: 0.02 10*3/uL (ref 0.00–0.07)
Basophils Absolute: 0 10*3/uL (ref 0.0–0.1)
Basophils Relative: 1 %
Eosinophils Absolute: 0.1 10*3/uL (ref 0.0–0.5)
Eosinophils Relative: 2 %
HCT: 40.1 % (ref 39.0–52.0)
Hemoglobin: 13.5 g/dL (ref 13.0–17.0)
Immature Granulocytes: 0 %
Lymphocytes Relative: 27 %
Lymphs Abs: 1.6 10*3/uL (ref 0.7–4.0)
MCH: 35.7 pg — ABNORMAL HIGH (ref 26.0–34.0)
MCHC: 33.7 g/dL (ref 30.0–36.0)
MCV: 106.1 fL — ABNORMAL HIGH (ref 80.0–100.0)
Monocytes Absolute: 0.4 10*3/uL (ref 0.1–1.0)
Monocytes Relative: 7 %
Neutro Abs: 3.8 10*3/uL (ref 1.7–7.7)
Neutrophils Relative %: 63 %
Platelet Count: 170 10*3/uL (ref 150–400)
RBC: 3.78 MIL/uL — ABNORMAL LOW (ref 4.22–5.81)
RDW: 11.3 % — ABNORMAL LOW (ref 11.5–15.5)
WBC Count: 6 10*3/uL (ref 4.0–10.5)
nRBC: 0 % (ref 0.0–0.2)

## 2019-09-18 NOTE — Progress Notes (Signed)
  [place] OFFICE PROGRESS NOTE   Diagnosis: Red cell macrocytosis  INTERVAL HISTORY:   Joseph Werner returns as scheduled.  He feels well.  Good appetite and energy level.  No fever or night sweats.  No complaint.  Objective:  Vital signs in last 24 hours:  Blood pressure (!) 141/65, pulse (!) 55, temperature 98 F (36.7 C), temperature source Temporal, resp. rate 16, height 6' (1.829 m), weight 185 lb 9.6 oz (84.2 kg), SpO2 100 %.    Limited physical examination secondary to distancing with the Covid pandemic Lymphatics: No cervical or supraclavicular nodes GI: Nontender,no hepatosplenomegaly, upper abdominal wall soft mobile cutaneous lesion consistent with a lipoma Vascular: Trace pitting edema at the lower leg bilaterally   Lab Results:  Lab Results  Component Value Date   WBC 6.0 09/18/2019   HGB 13.5 09/18/2019   HCT 40.1 09/18/2019   MCV 106.1 (H) 09/18/2019   PLT 170 09/18/2019   NEUTROABS 3.8 09/18/2019     Medications: I have reviewed the patient's current medications.   Assessment/Plan:  1. 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 A bone marrow biopsy in 2006 was nondiagnostic. 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 2. Chronic bradycardia. 3. History of a Borderline low white count 4. Right inguinal hernia repair 11/18/2012   Disposition: Joseph Werner is stable from a hematologic standpoint.  He has chronic red cell macrocytosis of unclear etiology.  We will follow-up on the most recent chemistry panel from Dr. Rex Kras.  Joseph Werner would like to continue follow-up in the hematology clinic.  He will return for an office visit and CBC in 1 year.  I encouraged Joseph Werner to obtain the COVID-19 vaccine.  Betsy Coder, MD  09/18/2019  11:38 AM

## 2019-09-19 ENCOUNTER — Telehealth: Payer: Self-pay | Admitting: Oncology

## 2019-09-19 NOTE — Telephone Encounter (Signed)
Scheduled per los. Called and left msg. Mailed printout  °

## 2019-10-26 ENCOUNTER — Ambulatory Visit: Payer: Medicare Other | Attending: Internal Medicine

## 2019-10-26 DIAGNOSIS — Z23 Encounter for immunization: Secondary | ICD-10-CM | POA: Insufficient documentation

## 2019-10-26 NOTE — Progress Notes (Signed)
   Covid-19 Vaccination Clinic  Name:  Joseph [place]    MRN: [id number] DOB: February 16, 1935  10/26/2019  [name] was observed post Covid-19 immunization for 15 minutes without incidence. He was provided with Vaccine Information Sheet and instruction to access the V-Safe system.   [name] was instructed to call 911 with any severe reactions post vaccine: Marland Kitchen Difficulty breathing  . Swelling of your face and throat  . A fast heartbeat  . A bad rash all over your body  . Dizziness and weakness    Immunizations Administered    Name Date Dose VIS Date Route   Pfizer COVID-19 Vaccine 10/26/2019  8:37 AM 0.3 mL 08/08/2019 Intramuscular   Manufacturer: [place]   Lot: [id number]   [place]: [id number]

## 2019-11-18 ENCOUNTER — Other Ambulatory Visit: Payer: Self-pay

## 2019-11-18 ENCOUNTER — Ambulatory Visit
Admission: RE | Admit: 2019-11-18 | Discharge: 2019-11-18 | Disposition: A | Discharge status: Home / Self Care | Payer: Medicare Other | Source: Ambulatory Visit | Attending: Family Medicine | Admitting: Family Medicine

## 2019-11-18 DIAGNOSIS — M8580 Other specified disorders of bone density and structure, unspecified site: Secondary | ICD-10-CM

## 2019-11-19 ENCOUNTER — Ambulatory Visit: Payer: Medicare Other | Attending: Internal Medicine

## 2019-11-19 DIAGNOSIS — Z23 Encounter for immunization: Secondary | ICD-10-CM

## 2019-11-19 NOTE — Progress Notes (Signed)
   Covid-19 Vaccination Clinic  Name:  Joseph [place]    MRN: [id number] DOB: 19-Jul-1935  11/19/2019  [name] was observed post Covid-19 immunization for 15 minutes without incident. He was provided with Vaccine Information Sheet and instruction to access the V-Safe system.   [name] was instructed to call 911 with any severe reactions post vaccine: Marland Kitchen Difficulty breathing  . Swelling of face and throat  . A fast heartbeat  . A bad rash all over body  . Dizziness and weakness   Immunizations Administered    Name Date Dose VIS Date Route   Pfizer COVID-19 Vaccine 11/19/2019  9:02 AM 0.3 mL 08/08/2019 Intramuscular   Manufacturer: [place]   Lot: [id number]   [place]: [id number]

## 2020-03-22 ENCOUNTER — Encounter: Payer: Self-pay | Admitting: Cardiology

## 2020-03-22 ENCOUNTER — Ambulatory Visit: Payer: Medicare Other | Admitting: Cardiology

## 2020-03-22 ENCOUNTER — Other Ambulatory Visit: Payer: Self-pay

## 2020-03-22 VITALS — BP 120/70 | HR 61 | Ht 72.0 in | Wt 181.0 lb

## 2020-03-22 DIAGNOSIS — I2510 Atherosclerotic heart disease of native coronary artery without angina pectoris: Secondary | ICD-10-CM | POA: Diagnosis not present

## 2020-03-22 DIAGNOSIS — R001 Bradycardia, unspecified: Secondary | ICD-10-CM

## 2020-03-22 DIAGNOSIS — I10 Essential (primary) hypertension: Secondary | ICD-10-CM | POA: Diagnosis not present

## 2020-03-22 DIAGNOSIS — Z951 Presence of aortocoronary bypass graft: Secondary | ICD-10-CM | POA: Diagnosis not present

## 2020-03-22 NOTE — Patient Instructions (Signed)
Medication Instructions:  The current medical regimen is effective;  continue present plan and medications.  *If you need a refill on your cardiac medications before your next appointment, please call your pharmacy*  Follow-Up: At [facility], you and your health needs are our priority.  As part of our continuing mission to provide you with exceptional heart care, we have created designated Provider Care Teams.  These Care Teams include your primary Cardiologist (physician) and Advanced Practice Providers (APPs -  Physician Assistants and Nurse Practitioners) who all work together to provide you with the care you need, when you need it.  We recommend signing up for the patient portal called "MyChart".  Sign up information is provided on this After Visit Summary.  MyChart is used to connect with patients for Virtual Visits (Telemedicine).  Patients are able to view lab/test results, encounter notes, upcoming appointments, etc.  Non-urgent messages can be sent to your provider as well.   To learn more about what you can do with MyChart, go to [email or web address].    Your next appointment:   12 month(s)  The format for your next appointment:   In Person  Provider:   Mark Skains, MD   Thank you for choosing [facility]!!      

## 2020-03-22 NOTE — Progress Notes (Signed)
Cardiology Office Note:    Date:  03/22/2020   ID:  Joseph Werner, DOB 1935-06-23, MRN [id number]  PCP:  Joseph Fess, MD  [facility] Cardiologist:  Joseph Furbish, MD  [facility] Electrophysiologist:  None   Referring MD: Joseph Fess, MD     History of Present Illness:    Joseph Werner is a 85 y.o. male with a hx of CAD with CABG in 1999, asymptomatic prior to CABG. Went to PCP and ordered a stress test at the time. In the middle of the test he stopped and Dr. Leonia Werner performed cath at the time. Went to CABG. No subsequent caths. Had been having ETT yearly. We discussed guidelines in that a yearly stress test evaluation was not necessary unless symptoms have changed. He was concerned about this because he has been having a stress test every year, exercise treadmill test, and then he did not have any symptoms prior to his bypass surgery. I also fully explained to him that it would not be unreasonable to forego stress testing at this point. He is willing to forego stress testing at this point. LIMA graft. He did not have any vein grafts.  Has had lower extremity edema left greater than right but it is better in the mornings.  He tells me that one of his church members just had a quintuple bypass surgery.  Overall doing very well without any fevers chills nausea vomiting syncope bleeding.  No chest pain no shortness of breath.  Past Medical History:  Diagnosis Date  . Anemia    macrocytic anemia  . Arthritis   . Bradycardia    chronic  . CAD (coronary artery disease)   . Dyslipidemia   . Eczema   . Hyperlipidemia   . Hypothyroid   . Loss of weight   . Macrocytosis    with normal bone marrow biopsy in 2006 may be benign varient, evaluated in the pastby hematologistDr. Benay Werner  . Osteopenia    on bone density test from orthopedist Joseph Werner, bone density test in July 2010 showed T score-1.1 in the lumbar spine and 2.1 in the femur. Bone density in August 2012 showed  T score 2.3 in the femur and 2.2 in the forearm  . Postsurgical aortocoronary bypass status   . Prostate cancer ([place])    H/O  . Prostate cancer [facility])    followed by urology Joseph Werner    Past Surgical History:  Procedure Laterality Date  . APPENDECTOMY    . CHOLECYSTECTOMY  1982  . CORONARY ARTERY BYPASS GRAFT  1998  . HERNIA REPAIR    . INGUINAL HERNIA REPAIR Right 11/18/2012   Procedure: HERNIA REPAIR INGUINAL ADULT;  Surgeon: Joseph Roof, MD;  Location: [place];  Service: General;  Laterality: Right;  . INSERTION OF MESH Right 11/18/2012   Procedure: INSERTION OF MESH;  Surgeon: Joseph Roof, MD;  Location: [place];  Service: General;  Laterality: Right;  . PROSTATECTOMY      Current Medications: Current Meds  Medication Sig  . Ascorbic Acid (VITAMIN C) 1000 MG tablet Take 2,000 mg by mouth daily.  Marland Kitchen aspirin EC 81 MG tablet Take 81 mg by mouth daily.  Marland Kitchen atorvastatin (LIPITOR) 40 MG tablet TAKE 1 TABLET (40 MG TOTAL) BY MOUTH DAILY AT 6 PM.  . Coenzyme Q10 Liposomal 100 MG/ML LIQD Take 100 mg by mouth daily.  . Flaxseed, Linseed, (FLAX SEED OIL PO) Take 1 capsule by mouth daily.  Marland Kitchen  GLUCOSAMINE-CHONDROITIN PO Take 1 tablet by mouth 2 (two) times daily.   . hydrocortisone valerate cream (WESTCORT) 0.2 % Apply 1 application topically 2 (two) times daily.   Marland Kitchen ibuprofen (ADVIL,MOTRIN) 200 MG tablet Take 400 mg by mouth every 6 (six) hours as needed for pain.  Marland Kitchen levothyroxine (SYNTHROID, LEVOTHROID) 100 MCG tablet Take 100 mcg by mouth daily.  . Selenium 200 MCG CAPS Take 200 mcg by mouth daily.     Allergies:   Patient has no known allergies.   Social History   Socioeconomic History  . Marital status: Married    Spouse name: Not on file  . Number of children: Not on file  . Years of education: Not on file  . Highest education level: Not on file  Occupational History  . Not on file  Tobacco Use  . Smoking status: Never Smoker  . Smokeless tobacco: Never Used   Substance and Sexual Activity  . Alcohol use: Yes    Alcohol/week: 5.0 standard drinks    Types: 5 Glasses of wine per week    Comment: wine with dinner  . Drug use: No  . Sexual activity: Not on file  Other Topics Concern  . Not on file  Social History Narrative  . Not on file   Social Determinants of Health   Financial Resource Strain:   . Difficulty of Paying Living Expenses:   Food Insecurity:   . Worried About Charity fundraiser in the Last Year:   . Arboriculturist in the Last Year:   Transportation Needs:   . Film/video editor (Medical):   Marland Kitchen Lack of Transportation (Non-Medical):   Physical Activity:   . Days of Exercise per Week:   . Minutes of Exercise per Session:   Stress:   . Feeling of Stress :   Social Connections:   . Frequency of Communication with Friends and Family:   . Frequency of Social Gatherings with Friends and Family:   . Attends Religious Services:   . Active Member of Clubs or Organizations:   . Attends Archivist Meetings:   Marland Kitchen Marital Status:      Family History: The patient's family history includes Brain cancer in his brother; Breast cancer in his sister; Diabetes in his brother; Heart disease in his father; Hypertension in his father; Lung cancer in his mother; Prostate cancer in his father. ROS:   Please see the history of present illness.     All other systems reviewed and are negative.  EKGs/Labs/Other Studies Reviewed:      EKG:  EKG is  ordered today.  The ekg ordered today demonstrates sinus rhythm 61, PAC no other abnormalities marked sinus bradycardia 48, PR interval 170, no changes from prior 03/12/17-sinus bradycardia rate 45 no other significant abnormalities-prior sinus rhythm heart rate 94 with PVCs, 4 seen on current EKG. 02/26/15-sinus bradycardia rate 53. Prior EKG-48 beats per min, other wise normal.  Recent Labs: 09/18/2019: Hemoglobin 13.5; Platelet Count 170  Recent Lipid Panel    Component Value  Date/Time   CHOL 135 01/07/2014 0735   Joseph 31.0 01/07/2014 0735   HDL 77.80 01/07/2014 0735   CHOLHDL 2 01/07/2014 0735   VLDL 6.2 01/07/2014 0735   LDLCALC 51 01/07/2014 0735    Physical Exam:    VS:  BP 120/70   Pulse 61   Ht 6' (1.829 m)   Wt 181 lb (82.1 kg)   BMI 24.55 kg/m  Wt Readings from Last 3 Encounters:  03/22/20 181 lb (82.1 kg)  09/18/19 185 lb 9.6 oz (84.2 kg)  03/17/19 184 lb 12.8 oz (83.8 kg)     GEN:  Well nourished, well developed in no acute distress HEENT: Normal NECK: No JVD; No carotid bruits LYMPHATICS: No lymphadenopathy CARDIAC: RRR, soft systolic right upper sternal border murmur, no rubs, gallops, CABG scar RESPIRATORY:  Clear to auscultation without rales, wheezing or rhonchi  ABDOMEN: Soft, non-tender, non-distended MUSCULOSKELETAL:  No edema; No deformity  SKIN: Warm and dry NEUROLOGIC:  Alert and oriented x 3 PSYCHIATRIC:  Normal affect   ASSESSMENT:    1. Atherosclerosis of native coronary artery of native heart without angina pectoris   2. S/P CABG (coronary artery bypass graft)   3. Bradycardia   4. Essential hypertension    PLAN:    In order of problems listed above:  Coronary artery disease -Status post CABG 1999, LIMA only, no vein grafts.  Doing very well.  No beta-blocker secondary to bradycardia in the past.  Secondary prevention.  No anginal symptoms.  Hyperlipidemia -Controlled with atorvastatin, goal of LDL is less than 70.  No myalgias.  Prior LDL 53, 2021  Bradycardia -Currently normal sinus rhythm.  Doing well.  Previously chronic and asymptomatic.  Avoid AV nodal blocking agents such as beta-blockers.  He does have chronotropic competence.  48 bpm prior, no pacer needed  Chronic anemia -Also followed by hematology on a yearly basis.  At one point he asked about blood donation and he said that it took him 2 weeks to recover.  He should probably abstain from this in this case.  Hemoglobin now 13.5  Prior  edema - He thought it was secondary to prostate surgery when "tied off a lymph node ".  Seems to be stable.  Watch salt intake.  Essential hypertension -Under excellent control.  Occasionally his diastolic pressures will be in the 60s.  He is asymptomatic.  Continue to monitor.  Discussed.    Medication Adjustments/Labs and Tests Ordered: Current medicines are reviewed at length with the patient today.  Concerns regarding medicines are outlined above.  Orders Placed This Encounter  Procedures  . EKG 12-Lead   No orders of the defined types were placed in this encounter.   Patient Instructions  Medication Instructions:  The current medical regimen is effective;  continue present plan and medications.  *If you need a refill on your cardiac medications before your next appointment, please call your pharmacy*  Follow-Up: At [facility], you and your health needs are our priority.  As part of our continuing mission to provide you with exceptional heart care, we have created designated Provider Care Teams.  These Care Teams include your primary Cardiologist (physician) and Advanced Practice Providers (APPs -  Physician Assistants and Nurse Practitioners) who all work together to provide you with the care you need, when you need it.  We recommend signing up for the patient portal called "MyChart".  Sign up information is provided on this After Visit Summary.  MyChart is used to connect with patients for Virtual Visits (Telemedicine).  Patients are able to view lab/test results, encounter notes, upcoming appointments, etc.  Non-urgent messages can be sent to your provider as well.   To learn more about what you can do with MyChart, go to [email or web address].    Your next appointment:   12 month(s)  The format for your next appointment:   In Person  Provider:   Candee Furbish,  MD  Thank you for choosing [facility]!!        Signed, Joseph Furbish, MD  03/22/2020  8:29 AM    [facility]

## 2020-05-25 ENCOUNTER — Other Ambulatory Visit: Payer: Self-pay

## 2020-05-25 DIAGNOSIS — I739 Peripheral vascular disease, unspecified: Secondary | ICD-10-CM

## 2020-06-08 ENCOUNTER — Ambulatory Visit (HOSPITAL_COMMUNITY)
Admission: RE | Admit: 2020-06-08 | Discharge: 2020-06-08 | Disposition: A | Discharge status: Home / Self Care | Payer: Medicare Other | Source: Ambulatory Visit | Attending: Vascular Surgery | Admitting: Vascular Surgery

## 2020-06-08 ENCOUNTER — Encounter: Payer: Self-pay | Admitting: Vascular Surgery

## 2020-06-08 ENCOUNTER — Other Ambulatory Visit: Payer: Self-pay

## 2020-06-08 ENCOUNTER — Ambulatory Visit: Payer: Medicare Other | Admitting: Vascular Surgery

## 2020-06-08 DIAGNOSIS — I739 Peripheral vascular disease, unspecified: Secondary | ICD-10-CM

## 2020-06-08 DIAGNOSIS — Z09 Encounter for follow-up examination after completed treatment for conditions other than malignant neoplasm: Secondary | ICD-10-CM

## 2020-06-08 NOTE — Progress Notes (Signed)
Patient name: Joseph Werner MRN: [id number] DOB: [birth year] Sex: male  REASON FOR CONSULT: Evaluate for PAD, abnormal screening on home health test  HPI: ADON GEHLHAUSEN is a 85 y.o. male, with history of coronary disease status post CABG and history of prostate cancer that presents for evaluation of PAD after an abnormal home health screen.  Patient states he was in his normal state of health until about a month and a half ago home health nurse evaluated his lower extremities and told him his blood flow was abnormal in the left leg.  He denies any significant lower extremity claudication symptoms, pain, or wounds.  He does note some mild swelling in the left leg ongoing since his prostate surgery.  He goes to the gym weekly and does 20 minutes on an elliptical and 30 minutes on a treadmill without any limitation.  He does not smoke.  He's had no history of previous revascularizations.  Past Medical History:  Diagnosis Date  . Anemia    macrocytic anemia  . Arthritis   . Bradycardia    chronic  . CAD (coronary artery disease)   . Dyslipidemia   . Eczema   . Hyperlipidemia   . Hypothyroid   . Loss of weight   . Macrocytosis    with normal bone marrow biopsy in 2006 may be benign varient, evaluated in the pastby hematologistDr. Benay Spice  . Osteopenia    on bone density test from orthopedist Dr. Nelva Bush, bone density test in July 2010 showed T score-1.1 in the lumbar spine and 2.1 in the femur. Bone density in August 2012 showed T score 2.3 in the femur and 2.2 in the forearm  . Postsurgical aortocoronary bypass status   . Prostate cancer ([place])    H/O  . Prostate cancer [facility])    followed by urology Dr. Diona Fanti    Past Surgical History:  Procedure Laterality Date  . APPENDECTOMY    . CHOLECYSTECTOMY  1982  . CORONARY ARTERY BYPASS GRAFT  1998  . HERNIA REPAIR    . INGUINAL HERNIA REPAIR Right 11/18/2012   Procedure: HERNIA REPAIR INGUINAL ADULT;  Surgeon: Merrie Roof,  MD;  Location: [place];  Service: General;  Laterality: Right;  . INSERTION OF MESH Right 11/18/2012   Procedure: INSERTION OF MESH;  Surgeon: Merrie Roof, MD;  Location: [place];  Service: General;  Laterality: Right;  . PROSTATECTOMY      Family History  Problem Relation Age of Onset  . Heart disease Father   . Hypertension Father   . Prostate cancer Father   . Lung cancer Mother   . Diabetes Brother   . Brain cancer Brother   . Breast cancer Sister     SOCIAL HISTORY: Social History   Socioeconomic History  . Marital status: Married    Spouse name: Not on file  . Number of children: Not on file  . Years of education: Not on file  . Highest education level: Not on file  Occupational History  . Not on file  Tobacco Use  . Smoking status: Never Smoker  . Smokeless tobacco: Never Used  Substance and Sexual Activity  . Alcohol use: Yes    Alcohol/week: 5.0 standard drinks    Types: 5 Glasses of wine per week    Comment: wine with dinner  . Drug use: No  . Sexual activity: Not on file  Other Topics Concern  . Not on file  Social History  Narrative  . Not on file   Social Determinants of Health   Financial Resource Strain:   . Difficulty of Paying Living Expenses: Not on file  Food Insecurity:   . Worried About Charity fundraiser in the Last Year: Not on file  . Ran Out of Food in the Last Year: Not on file  Transportation Needs:   . Lack of Transportation (Medical): Not on file  . Lack of Transportation (Non-Medical): Not on file  Physical Activity:   . Days of Exercise per Week: Not on file  . Minutes of Exercise per Session: Not on file  Stress:   . Feeling of Stress : Not on file  Social Connections:   . Frequency of Communication with Friends and Family: Not on file  . Frequency of Social Gatherings with Friends and Family: Not on file  . Attends Religious Services: Not on file  . Active Member of Clubs or Organizations: Not on file  . Attends Theatre manager Meetings: Not on file  . Marital Status: Not on file  Intimate Partner Violence:   . Fear of Current or Ex-Partner: Not on file  . Emotionally Abused: Not on file  . Physically Abused: Not on file  . Sexually Abused: Not on file    No Known Allergies  Current Outpatient Medications  Medication Sig Dispense Refill  . Ascorbic Acid (VITAMIN C) 1000 MG tablet Take 2,000 mg by mouth daily.    Marland Kitchen aspirin EC 81 MG tablet Take 81 mg by mouth daily.    Marland Kitchen atorvastatin (LIPITOR) 40 MG tablet TAKE 1 TABLET (40 MG TOTAL) BY MOUTH DAILY AT 6 PM. 30 tablet 1  . Coenzyme Q10 Liposomal 100 MG/ML LIQD Take 100 mg by mouth daily.    . Flaxseed, Linseed, (FLAX SEED OIL PO) Take 1 capsule by mouth daily.    Marland Kitchen GLUCOSAMINE-CHONDROITIN PO Take 1 tablet by mouth 2 (two) times daily.     . hydrocortisone valerate cream (WESTCORT) 0.2 % Apply 1 application topically 2 (two) times daily.     Marland Kitchen ibuprofen (ADVIL,MOTRIN) 200 MG tablet Take 400 mg by mouth every 6 (six) hours as needed for pain.    Marland Kitchen levothyroxine (SYNTHROID, LEVOTHROID) 100 MCG tablet Take 100 mcg by mouth daily.    . Selenium 200 MCG CAPS Take 200 mcg by mouth daily.     No current facility-administered medications for this visit.    REVIEW OF SYSTEMS:  '[X]'  denotes positive finding, '[ ]'  denotes negative finding Cardiac  Comments:  Chest pain or chest pressure:    Shortness of breath upon exertion:    Short of breath when lying flat:    Irregular heart rhythm:        Vascular    Pain in calf, thigh, or hip brought on by ambulation:    Pain in feet at night that wakes you up from your sleep:     Blood clot in your veins:    Leg swelling:         Pulmonary    Oxygen at home:    Productive cough:     Wheezing:         Neurologic    Sudden weakness in arms or legs:     Sudden numbness in arms or legs:     Sudden onset of difficulty speaking or slurred speech:    Temporary loss of vision in one eye:     Problems with  dizziness:  Gastrointestinal    Blood in stool:     Vomited blood:         Genitourinary    Burning when urinating:     Blood in urine:        Psychiatric    Major depression:         Hematologic    Bleeding problems:    Problems with blood clotting too easily:        Skin    Rashes or ulcers:        Constitutional    Fever or chills:      PHYSICAL EXAM: Vitals:   06/08/20 1438  BP: (!) 112/50  Pulse: (!) 52  Resp: 18  Temp: 98.1 F (36.7 C)  TempSrc: Temporal  SpO2: 98%  Weight: 175 lb (79.4 kg)  Height: 6' (1.829 m)    GENERAL: The patient is a well-nourished male, in no acute distress. The vital signs are documented above. CARDIAC: There is a regular rate and rhythm.  VASCULAR:  Palpable femoral pulses bilaterally Palpable dorsalis pedis and posterior tibial pulses bilaterally PULMONARY: There is good air exchange bilaterally without wheezing or rales. ABDOMEN: Soft and non-tender.  MUSCULOSKELETAL: There are no major deformities or cyanosis. NEUROLOGIC: No focal weakness or paresthesias are detected. SKIN: There are no ulcers or rashes noted. PSYCHIATRIC: The patient has a normal affect.  DATA:   ABIs today are 1.23 on the right triphasic and 1.25 on the left triphasic  Assessment/Plan:  85 year old male presents for evaluation of PAD after reportedly abnormal home health screening.  Discussed with him that he has a completely normal exam with palpable dorsalis pedis and posterior tibial pulses with normal ABIs greater than 1 and normal triphasic waveforms at the ankle.  There is no indication for vascular surgery intervention.  In addition he is completely asymptomatic with no claudication or other lower extremity symptoms that would warrant intervention from a PAD standpoint.  I am actually impressed that he is staying so active and does 20 minutes on an elliptical and 30 minutes on a treadmill at the gym without any limitation in his lower  extremities.  Happy to see him in the future if needed, but discussed followpup PRN.     Marty Heck, MD [facility] Office: [phone]

## 2020-09-17 ENCOUNTER — Other Ambulatory Visit: Payer: Self-pay

## 2020-09-17 ENCOUNTER — Telehealth: Payer: Self-pay

## 2020-09-17 ENCOUNTER — Inpatient Hospital Stay: Payer: Medicare Other | Attending: Oncology

## 2020-09-17 DIAGNOSIS — D7589 Other specified diseases of blood and blood-forming organs: Secondary | ICD-10-CM | POA: Diagnosis not present

## 2020-09-17 DIAGNOSIS — D649 Anemia, unspecified: Secondary | ICD-10-CM | POA: Insufficient documentation

## 2020-09-17 DIAGNOSIS — Z79899 Other long term (current) drug therapy: Secondary | ICD-10-CM | POA: Insufficient documentation

## 2020-09-17 LAB — CBC WITH DIFFERENTIAL (CANCER CENTER ONLY)
Abs Immature Granulocytes: 0.01 10*3/uL (ref 0.00–0.07)
Basophils Absolute: 0 10*3/uL (ref 0.0–0.1)
Basophils Relative: 1 %
Eosinophils Absolute: 0.1 10*3/uL (ref 0.0–0.5)
Eosinophils Relative: 2 %
HCT: 38.9 % — ABNORMAL LOW (ref 39.0–52.0)
Hemoglobin: 12.9 g/dL — ABNORMAL LOW (ref 13.0–17.0)
Immature Granulocytes: 0 %
Lymphocytes Relative: 37 %
Lymphs Abs: 2.2 10*3/uL (ref 0.7–4.0)
MCH: 35.3 pg — ABNORMAL HIGH (ref 26.0–34.0)
MCHC: 33.2 g/dL (ref 30.0–36.0)
MCV: 106.6 fL — ABNORMAL HIGH (ref 80.0–100.0)
Monocytes Absolute: 0.5 10*3/uL (ref 0.1–1.0)
Monocytes Relative: 9 %
Neutro Abs: 3 10*3/uL (ref 1.7–7.7)
Neutrophils Relative %: 51 %
Platelet Count: 174 10*3/uL (ref 150–400)
RBC: 3.65 MIL/uL — ABNORMAL LOW (ref 4.22–5.81)
RDW: 11.3 % — ABNORMAL LOW (ref 11.5–15.5)
WBC Count: 5.8 10*3/uL (ref 4.0–10.5)
nRBC: 0 % (ref 0.0–0.2)

## 2020-09-17 NOTE — Telephone Encounter (Signed)
Pt contacted to give lab results/Hgb per MD Benay Spice. Pt offered appointment in 3 to 4 monthsd but pt opted to return in 1 year. [place] for 1 year appointment

## 2020-09-20 ENCOUNTER — Other Ambulatory Visit: Payer: Self-pay | Admitting: *Deleted

## 2020-09-20 DIAGNOSIS — D7589 Other specified diseases of blood and blood-forming organs: Secondary | ICD-10-CM

## 2020-09-22 ENCOUNTER — Telehealth: Payer: Self-pay | Admitting: Oncology

## 2020-09-22 NOTE — Telephone Encounter (Signed)
Scheduled appt per sch msg - mailed letter with app date and time for 2023

## 2020-10-05 ENCOUNTER — Other Ambulatory Visit: Payer: Self-pay

## 2020-10-05 ENCOUNTER — Other Ambulatory Visit (HOSPITAL_COMMUNITY): Payer: Self-pay | Admitting: Family Medicine

## 2020-10-05 ENCOUNTER — Ambulatory Visit (HOSPITAL_COMMUNITY)
Admission: RE | Admit: 2020-10-05 | Discharge: 2020-10-05 | Disposition: A | Discharge status: Home / Self Care | Payer: Medicare Other | Source: Ambulatory Visit | Attending: Family Medicine | Admitting: Family Medicine

## 2020-10-05 DIAGNOSIS — M79604 Pain in right leg: Secondary | ICD-10-CM

## 2020-10-05 DIAGNOSIS — M79605 Pain in left leg: Secondary | ICD-10-CM

## 2020-10-05 DIAGNOSIS — R600 Localized edema: Secondary | ICD-10-CM

## 2020-10-05 NOTE — Progress Notes (Signed)
Bilateral lower extremity venous duplex has been completed. Preliminary results can be found in CV Proc through chart review.  Results were given to Jillyn Ledger FNP.  10/05/20 1:12 PM Carlos Levering RVT

## 2020-12-08 ENCOUNTER — Other Ambulatory Visit: Payer: Self-pay | Admitting: Sports Medicine

## 2020-12-08 ENCOUNTER — Other Ambulatory Visit (HOSPITAL_COMMUNITY): Payer: Self-pay | Admitting: Sports Medicine

## 2020-12-08 DIAGNOSIS — R2242 Localized swelling, mass and lump, left lower limb: Secondary | ICD-10-CM

## 2020-12-13 ENCOUNTER — Ambulatory Visit (HOSPITAL_COMMUNITY)
Admission: RE | Admit: 2020-12-13 | Discharge: 2020-12-13 | Disposition: A | Discharge status: Home / Self Care | Payer: Medicare Other | Source: Ambulatory Visit | Attending: Sports Medicine | Admitting: Sports Medicine

## 2020-12-13 ENCOUNTER — Other Ambulatory Visit: Payer: Self-pay

## 2020-12-13 DIAGNOSIS — R2242 Localized swelling, mass and lump, left lower limb: Secondary | ICD-10-CM

## 2020-12-13 IMAGING — MR MR FEMUR*L* WO/W CM
7 of 8 series · 34 of 40 positions shown · IV contrast (gadavist)
Comparison: None.

CLINICAL DATA: Left thigh mass. History prostate cancer
prostatectomy

EXAM:
MR OF THE LEFT LOWER EXTREMITY WITHOUT AND WITH CONTRAST
TECHNIQUE: Multiplanar, multisequence MR imaging of the left femur was
performed both before and after administration of intravenous
contrast.
CONTRAST:  7.5mL GADAVIST GADOBUTROL 1 MMOL/ML IV SOLN

[Series 3: T1 · coronal · left · 4.0mm · 1.95mm/px · 3 of 32 slices shown (1 of 2)]
[im 1/32]
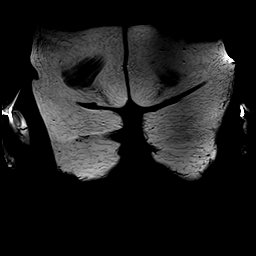
[im 16/32]
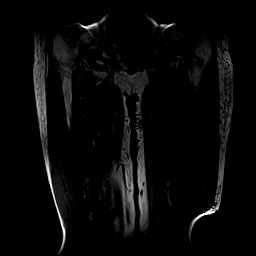
[im 32/32]
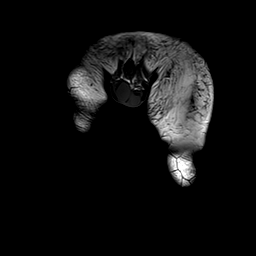

[Series 4: STIR · coronal · left · 4.0mm · 1.95mm/px · 3 of 32 slices shown (1 of 2)]
[im 1/32]
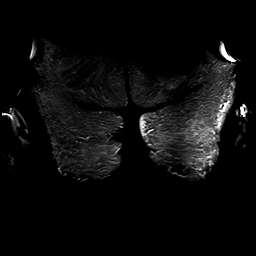
[im 16/32]
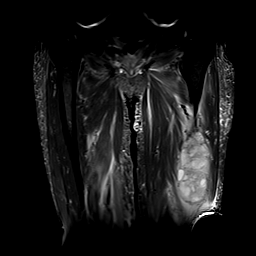
[im 32/32]
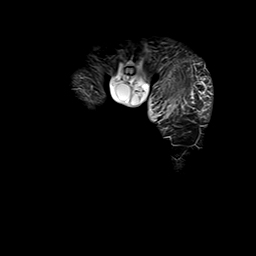

[Series 5: T1 · axial · left · 4.5mm · 1.02mm/px · z∈[-235,+131]mm · 7 of 66 slices shown (2 of 2)]
[im 1/66]
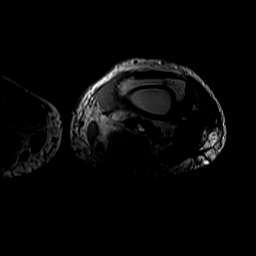
[im 11/66]
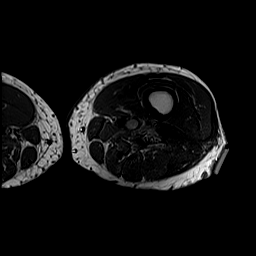
[im 22/66]
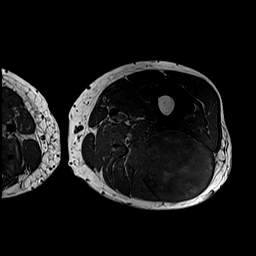
[im 33/66]
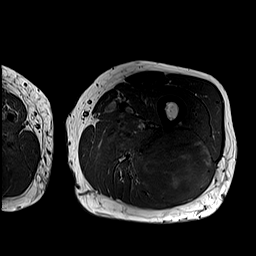
[im 44/66]
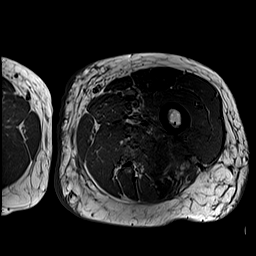
[im 55/66]
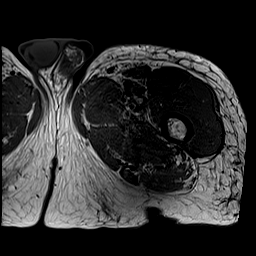
[im 66/66]
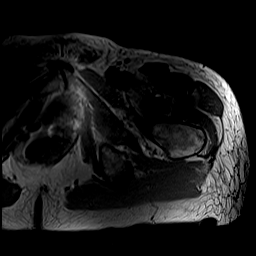

[Series 6: T2 fat-sat · axial · left · 4.5mm · 1.02mm/px · z∈[-235,+131]mm · 7 of 66 slices shown]
[im 1/66]
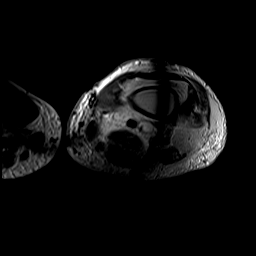
[im 11/66]
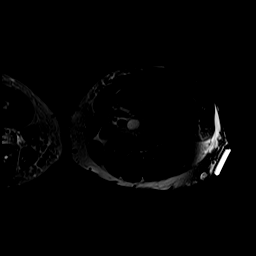
[im 22/66]
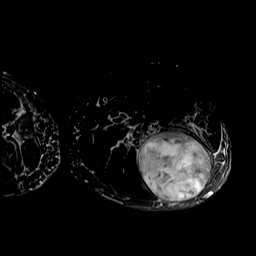
[im 33/66]
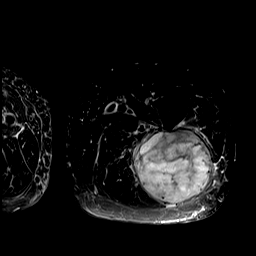
[im 44/66]
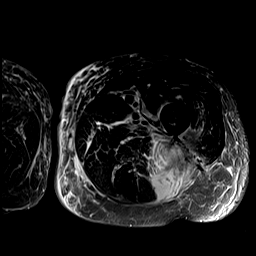
[im 55/66]
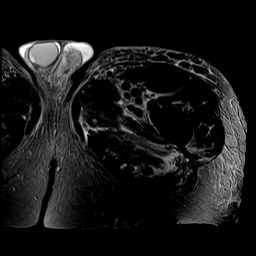
[im 66/66]
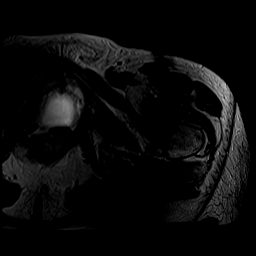

[Series 7: STIR · sagittal · left · 4.0mm · 1.88mm/px · 3 of 34 slices shown (2 of 2)]
[im 1/34]
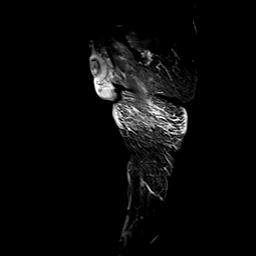
[im 17/34]
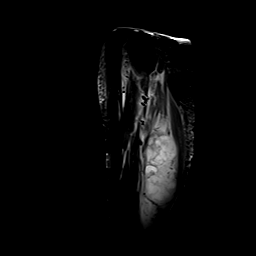
[im 34/34]
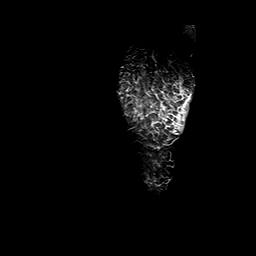

[Series 8: pre axial ti · axial · non-contrast · left · 4.5mm · 0.51mm/px · z∈[-235,+131]mm · 7 of 66 slices shown]
[im 1/66]
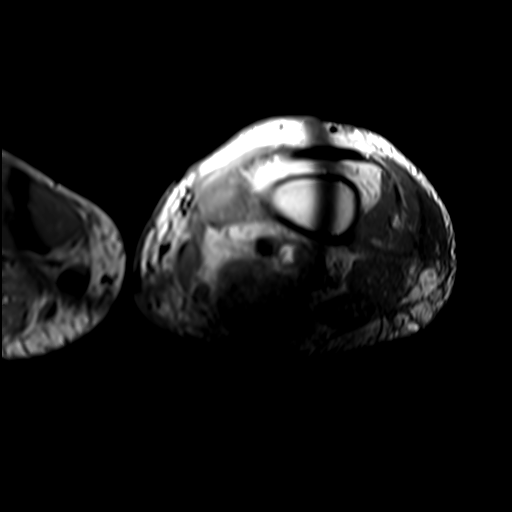
[im 11/66]
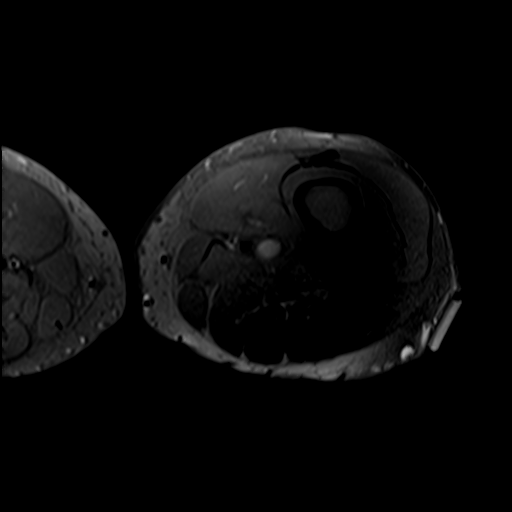
[im 22/66]
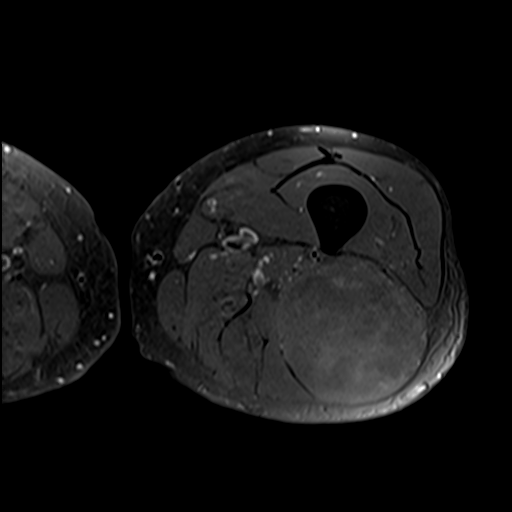
[im 33/66]
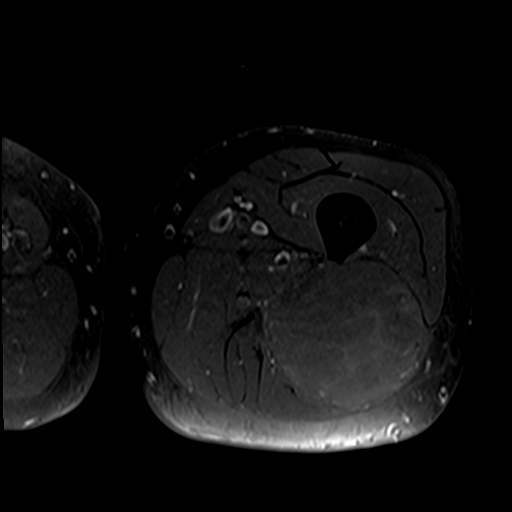
[im 44/66]
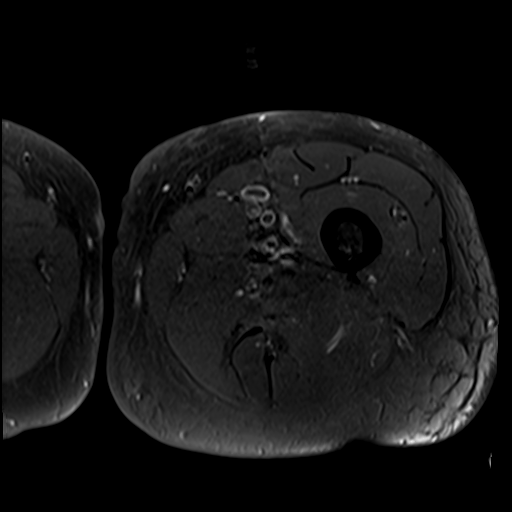
[im 55/66]
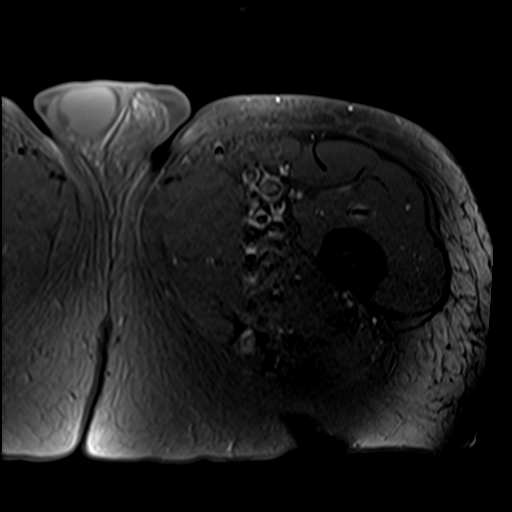
[im 66/66]
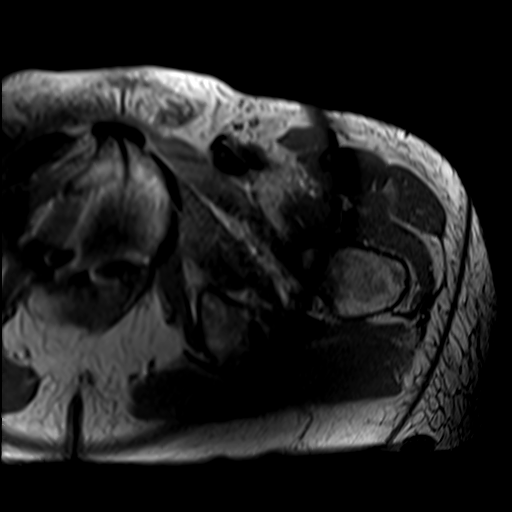

[Series 9: post axial ti · axial · left · 4.5mm · 0.51mm/px · z∈[-235,-55]mm · 4 of 66 slices shown]
[im 1/66]
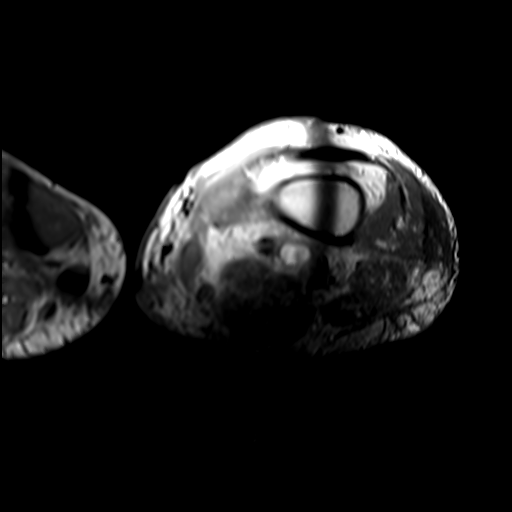
[im 11/66]
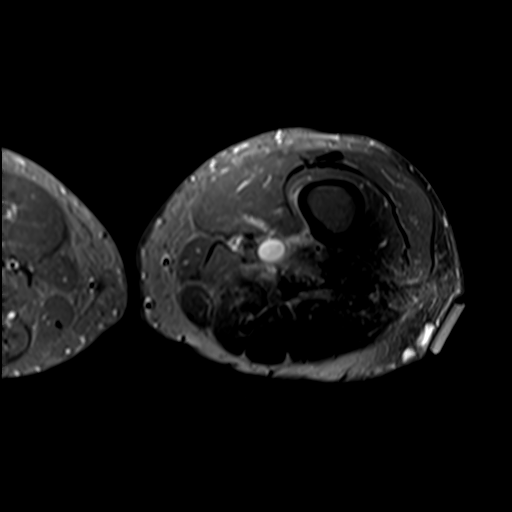
[im 22/66]
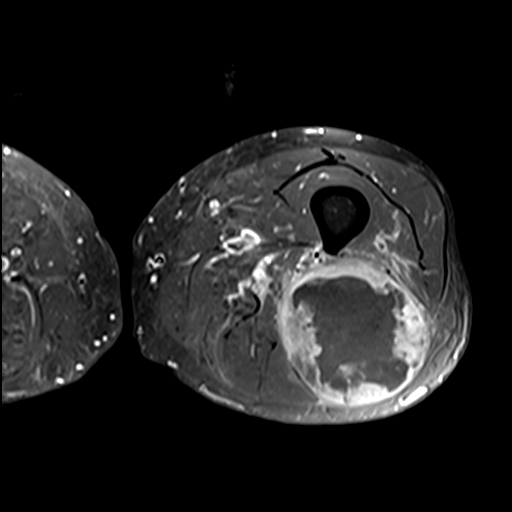
[im 33/66]
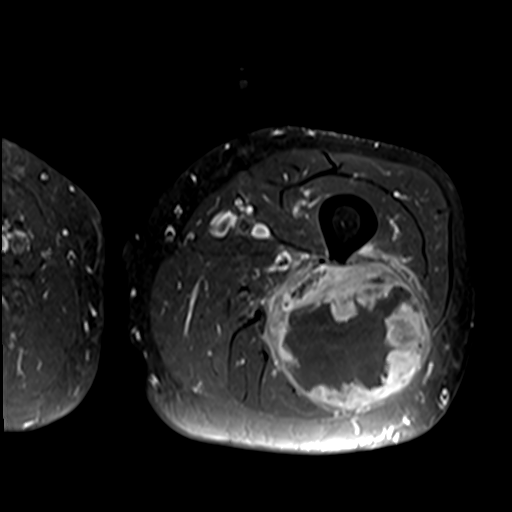

[34 of 40 positions shown; findings below may reference images not displayed]

FINDINGS: Bones/Joint/Cartilage

The included portion of the femur appears unremarkable.

Ligaments

N/A

Muscles and Tendons

A mass in the left biceps femoris muscle measures 16.0 by 8.0 by
cm (volume = 500 cm^3) as shown on image 36 series 9 and image 13
series 4. This is internally heterogeneous with thick irregular
enhancing margin and appearance suggesting central necrosis. Faintly
accentuated internal T1 signal without substantial fatty component,
probably from proteinaceous or blood products. Given the thick
irregular enhancing margins, acute hematoma is unlikely although
there may be some internal blood products. The degree and
irregularity of enhancement would also seem to favored neoplasm over
abscess, although clearly correlation with the patient's clinical
scenario is warranted.

There is edema tracking along fascia planes adjacent to the biceps
femoris, and also some fluid signal tracking along the superficial
fascia margin of the vastus medialis. There is low-level edema in
the vastus intermedius adjacent to the mass. The sciatic nerve is
medially displaced by the mass, with edema tracking along fascia
planes surrounding the sciatic nerve. Some of the low-level regional
edema tracks up along the left hip adductor musculature.

Soft tissues

There is subcutaneous edema in the left thigh and in the visualized
portion of the right thigh. Small bilateral scrotal hydroceles.
IMPRESSION: 1. Heterogeneously enhancing and centrally necrotic mass in the left
biceps femoris muscle. Appearances could concerning for malignancy,
with abscess being substantially less likely based on the degree and
pattern of enhancement, although correlation with the clinical
scenario is suggested. Assuming this is not an obvious infection
based on the clinical presentation, orthopedic tumor specialist
referral is recommended.
2. There is edema in tissue planes around the mass, as well as
medial displacement of the sciatic nerve in the vicinity of the
mass.

## 2020-12-13 MED ORDER — GADOBUTROL 1 MMOL/ML IV SOLN
7.5000 mL | Freq: Once | INTRAVENOUS | Status: AC | PRN
  Administered 2020-12-13: 7.5 mL via INTRAVENOUS

## 2021-01-04 NOTE — Progress Notes (Signed)
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  Location(s) of Symptomatic tumor(s): Left Posterior Thigh  Surgical Pathology 12/23/2020   Past/Anticipated chemotherapy by medical oncology, if any:    Patient's main complaints related to symptomatic tumor(s) are: Pain  Pain on a scale of 0-10 is: 5/10 Left posterior thigh, lower leg.  Swelling and painful with ambulation.     Ambulatory status? Walker? Wheelchair?: Ambulatory.  SAFETY ISSUES:  Prior radiation? No  Pacemaker/ICD? No  Possible current pregnancy? n/a  Is the patient on methotrexate? No  Additional Complaints / other details:   -s/p prostatectomy- followed by Dr. Diona Fanti

## 2021-01-05 ENCOUNTER — Ambulatory Visit
Admission: RE | Admit: 2021-01-05 | Discharge: 2021-01-05 | Disposition: A | Discharge status: Home / Self Care | Payer: Medicare Other | Source: Ambulatory Visit | Attending: Radiation Oncology | Admitting: Radiation Oncology

## 2021-01-05 ENCOUNTER — Other Ambulatory Visit: Payer: Self-pay

## 2021-01-05 ENCOUNTER — Encounter: Payer: Self-pay | Admitting: Radiation Oncology

## 2021-01-05 DIAGNOSIS — C4922 Malignant neoplasm of connective and soft tissue of left lower limb, including hip: Secondary | ICD-10-CM | POA: Insufficient documentation

## 2021-01-05 DIAGNOSIS — Z79899 Other long term (current) drug therapy: Secondary | ICD-10-CM | POA: Insufficient documentation

## 2021-01-05 DIAGNOSIS — I2510 Atherosclerotic heart disease of native coronary artery without angina pectoris: Secondary | ICD-10-CM | POA: Insufficient documentation

## 2021-01-05 DIAGNOSIS — E039 Hypothyroidism, unspecified: Secondary | ICD-10-CM | POA: Diagnosis not present

## 2021-01-05 DIAGNOSIS — R000 Tachycardia, unspecified: Secondary | ICD-10-CM | POA: Insufficient documentation

## 2021-01-05 DIAGNOSIS — E785 Hyperlipidemia, unspecified: Secondary | ICD-10-CM | POA: Diagnosis not present

## 2021-01-05 DIAGNOSIS — M8580 Other specified disorders of bone density and structure, unspecified site: Secondary | ICD-10-CM | POA: Diagnosis not present

## 2021-01-05 DIAGNOSIS — Z8546 Personal history of malignant neoplasm of prostate: Secondary | ICD-10-CM | POA: Diagnosis not present

## 2021-01-05 NOTE — Progress Notes (Signed)
Radiation Oncology         [phone] ________________________________  Name: Joseph Werner        MRN: [id number]  Date of Service: 01/05/2021 DOB: October 12, 1934  DJ:MEQAS, Beryle Lathe, FNP  Meda Klinefelter, MD     REFERRING PHYSICIAN: Meda Klinefelter, MD   DIAGNOSIS: The encounter diagnosis was Sarcoma of left thigh ([place]).   HISTORY OF PRESENT ILLNESS: Joseph Werner is a 86 y.o. male seen at the request of Dr. Francesca Jewett at [facility] for a newly diagnosed liposarcoma of the posterior left thigh. Started to develop lower extremity edema and was evaluated in February 2022 he underwent a lower extremity venous Doppler ultrasound which was done on both sides and was negative for thrombosis.  He developed pain and fullness in that section and was seen by Dr. Sheppard Coil who recommended in April that the patient undergo an MRI which showed an 16 cm left biceps femoris muscle mass with a thick irregular enhancing margin and edema tracking along the fascial planes adjacent to the muscle with some fluid signal along the superficial muscle margin and vastus medialis, the sciatic nerve was displaced by the mass with edema tracking along the fascial planes surrounding the sciatic nerve and subcutaneous edema in the left throughout thigh and in the visualized portion of the right thigh.  Bilateral scrotal hydroceles were noted.  No adenopathy was identified.  He was referred to Dr. 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  Dr. Sherlynn Stalls has recommended surgical resection after neoadjuvant radiotherapy, he met with Dr. Francesca Jewett yesterday at [facility] but desired treatment closer to home.  He is seen today to discuss preoperative radiotherapy to the left side.    PREVIOUS RADIATION THERAPY: No   PAST MEDICAL HISTORY:  Past Medical History:  Diagnosis  Date  . Anemia    macrocytic anemia  . Arthritis   . Bradycardia    chronic  . CAD (coronary artery disease)   . Dyslipidemia   . Eczema   . Hyperlipidemia   . Hypothyroid   . Loss of weight   . Macrocytosis    with normal bone marrow biopsy in 2006 may be benign varient, evaluated in the pastby hematologistDr. Benay Spice  . Osteopenia    on bone density test from orthopedist Dr. Nelva Bush, bone density test in July 2010 showed T score-1.1 in the lumbar spine and 2.1 in the femur. Bone density in August 2012 showed T score 2.3 in the femur and 2.2 in the forearm  . Postsurgical aortocoronary bypass status   . Prostate cancer ([place])    H/O  . Prostate cancer [facility])    followed by urology Dr. Diona Fanti       PAST SURGICAL HISTORY: Past Surgical History:  Procedure Laterality Date  . APPENDECTOMY    . CHOLECYSTECTOMY  1982  . CORONARY ARTERY BYPASS GRAFT  1998  . HERNIA REPAIR    . INGUINAL HERNIA REPAIR Right 11/18/2012   Procedure: HERNIA REPAIR INGUINAL ADULT;  Surgeon: Merrie Roof, MD;  Location: [place];  Service: General;  Laterality: Right;  . INSERTION OF MESH Right 11/18/2012   Procedure: INSERTION OF MESH;  Surgeon: Merrie Roof, MD;  Location: [place];  Service: General;  Laterality: Right;  . PROSTATECTOMY       FAMILY HISTORY:  Family History  Problem Relation Age  of Onset  . Heart disease Father   . Hypertension Father   . Prostate cancer Father   . Lung cancer Mother   . Diabetes Brother   . Brain cancer Brother   . Breast cancer Sister      SOCIAL HISTORY:  reports that he has never smoked. He has never used smokeless tobacco. He reports current alcohol use of about 5.0 standard drinks of alcohol per week. He reports that he does not use drugs.   ALLERGIES: Patient has no known allergies.   MEDICATIONS:  Current Outpatient Medications  Medication Sig Dispense Refill  . Ascorbic Acid (VITAMIN C) 1000 MG tablet Take 2,000 mg by mouth daily.    Marland Kitchen  aspirin EC 81 MG tablet Take 81 mg by mouth daily.    Marland Kitchen atorvastatin (LIPITOR) 40 MG tablet TAKE 1 TABLET (40 MG TOTAL) BY MOUTH DAILY AT 6 PM. 30 tablet 1  . Cholecalciferol 50 MCG (2000 UT) TABS 1 tablet with a meal    . Coenzyme Q10 Liposomal 100 MG/ML LIQD Take 100 mg by mouth daily.    . Flaxseed, Linseed, (FLAX SEED OIL PO) Take 1 capsule by mouth daily.    Marland Kitchen GLUCOSAMINE-CHONDROITIN PO Take 1 tablet by mouth 2 (two) times daily.     Marland Kitchen ibuprofen (ADVIL,MOTRIN) 200 MG tablet Take 400 mg by mouth every 6 (six) hours as needed for pain.    Marland Kitchen levothyroxine (SYNTHROID, LEVOTHROID) 100 MCG tablet Take 100 mcg by mouth daily.    . Selenium 200 MCG CAPS Take 200 mcg by mouth daily.    . hydrocortisone valerate cream (WESTCORT) 0.2 % Apply 1 application topically 2 (two) times daily.     . traMADol (ULTRAM) 50 MG tablet Take 50 mg by mouth 2 (two) times daily as needed. (Patient not taking: Reported on 01/05/2021)     No current facility-administered medications for this encounter.     REVIEW OF SYSTEMS: On review of systems, the patient reports that he is doing well overall. He does report complaints of pain in the posterior left thigh, he describes swelling that has been going on since at least February, and has tried using thigh-high compression stockings which is very difficult to put on but also they are difficult to tolerate because this pushes the fluid more anterior and above to the level of where his tumor is located.  He is able to get some relief with improvement of his swelling when he wakes up in the morning from sleeping, he also props his foot up quite high to get relief as well.  He denies any loss of function in terms of range of motion but states it is very uncomfortable if he has fullness behind the level of the knee when he tries to bend his knee.  He is still unable to exercise despite all of this, but has taken a break for the last few days.  He is taking Tylenol as needed for pain  which alleviates his symptoms to some degree.    PHYSICAL EXAM:  Wt Readings from Last 3 Encounters:  01/05/21 179 lb 8 oz (81.4 kg)  06/08/20 175 lb (79.4 kg)  03/22/20 181 lb (82.1 kg)   Temp Readings from Last 3 Encounters:  01/05/21 (!) 97 F (36.1 C) (Temporal)  06/08/20 98.1 F (36.7 C) (Temporal)  09/18/19 98 F (36.7 C) (Temporal)   BP Readings from Last 3 Encounters:  01/05/21 (!) 116/52  06/08/20 (!) 112/50  03/22/20 120/70  Pulse Readings from Last 3 Encounters:  01/05/21 72  06/08/20 (!) 52  03/22/20 61   Pain Assessment Pain Score: 5  Pain Loc: Leg (Left thigh, posterior leg at knee bend)/10  In general this is a well appearing Caucasian male in no acute distress.  He's alert and oriented x4 and appropriate throughout the examination. Cardiopulmonary assessment is negative for acute distress and he exhibits normal effort.  The left posterior thigh is evaluated and there is a large palpable mass that is warm to the touch but not eroding through the skin on the posterior thigh.  As a result there is 2+ pitting edema of the lower extremity from the foot to the level of this mass.  The mass is tender to touch.  It does also appear that there is a large blood vessel along the superior aspect of this site.  It is difficult to tell however that if this is a vasculitis.       ECOG = 1  0 - Asymptomatic (Fully active, able to carry on all predisease activities without restriction)  1 - Symptomatic but completely ambulatory (Restricted in physically strenuous activity but ambulatory and able to carry out work of a light or sedentary nature. For example, light housework, office work)  2 - Symptomatic, <50% in bed during the day (Ambulatory and capable of all self care but unable to carry out any work activities. Up and about more than 50% of waking hours)  3 - Symptomatic, >50% in bed, but not bedbound (Capable of only limited self-care, confined to bed or chair 50%  or more of waking hours)  4 - Bedbound (Completely disabled. Cannot carry on any self-care. Totally confined to bed or chair)  5 - Death   Eustace Pen MM, Creech RH, Tormey DC, et al. [phone]). "Toxicity and response criteria of the [facility]". [place] Oncol. 5 (6): 649-55    LABORATORY DATA:  Lab Results  Component Value Date   WBC 5.8 09/17/2020   HGB 12.9 (L) 09/17/2020   HCT 38.9 (L) 09/17/2020   MCV 106.6 (H) 09/17/2020   PLT 174 09/17/2020   Lab Results  Component Value Date   NA 143 11/11/2012   K 4.4 11/11/2012   CL 108 11/11/2012   CO2 29 11/11/2012   Lab Results  Component Value Date   ALT 22 01/07/2014   AST 29 01/07/2014   ALKPHOS 26 (L) 01/07/2014   BILITOT 1.6 (H) 01/07/2014      RADIOGRAPHY: MR FEMUR LEFT W WO CONTRAST  Result Date: 12/14/2020 CLINICAL DATA:  Left thigh mass. History prostate cancer prostatectomy EXAM: MR OF THE LEFT LOWER EXTREMITY WITHOUT AND WITH CONTRAST TECHNIQUE: Multiplanar, multisequence MR imaging of the left femur was performed both before and after administration of intravenous contrast. CONTRAST:  7.19mL GADAVIST GADOBUTROL 1 MMOL/ML IV SOLN COMPARISON:  None. FINDINGS: Bones/Joint/Cartilage The included portion of the femur appears unremarkable. Ligaments N/A Muscles and Tendons A mass in the left biceps femoris muscle measures 16.0 by 8.0 by 7.4 cm (volume = 500 cm^3) as shown on image 36 series 9 and image 13 series 4. This is internally heterogeneous with thick irregular enhancing margin and appearance suggesting central necrosis. Faintly accentuated internal T1 signal without substantial fatty component, probably from proteinaceous or blood products. Given the thick irregular enhancing margins, acute hematoma is unlikely although there may be some internal blood products. The degree and irregularity of enhancement would also seem to favored neoplasm over  abscess, although clearly correlation with the patient's  clinical scenario is warranted. There is edema tracking along fascia planes adjacent to the biceps femoris, and also some fluid signal tracking along the superficial fascia margin of the vastus medialis. There is low-level edema in the vastus intermedius adjacent to the mass. The sciatic nerve is medially displaced by the mass, with edema tracking along fascia planes surrounding the sciatic nerve. Some of the low-level regional edema tracks up along the left hip adductor musculature. Soft tissues There is subcutaneous edema in the left thigh and in the visualized portion of the right thigh. Small bilateral scrotal hydroceles. IMPRESSION: 1. Heterogeneously enhancing and centrally necrotic mass in the left biceps femoris muscle. Appearances could concerning for malignancy, with abscess being substantially less likely based on the degree and pattern of enhancement, although correlation with the clinical scenario is suggested. Assuming this is not an obvious infection based on the clinical presentation, orthopedic tumor specialist referral is recommended. 2. There is edema in tissue planes around the mass, as well as medial displacement of the sciatic nerve in the vicinity of the mass. Electronically Signed   By: Van Clines M.D.   On: 12/14/2020 08:06       IMPRESSION/PLAN: 1. 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  Dr. Lisbeth Renshaw discusses the findings and rationale for preoperative radiotherapy.  He discusses the course that he would recommend would be 5 weeks to the left thigh, he would anticipate coordination with Dr. Sherlynn Stalls at [facility].  The patient is interested in moving forward after we discussed the risks, benefits, short and long-term effects of radiotherapy.  He has signed written consent.  A copy was provided to the patient and another placed in his chart.  He will return tomorrow for simulation.  We anticipate starting his treatment on Monday, 01/10/2021.  He should complete his  course of 5 weeks of treatment given the Memorial Day holiday the week of 02/14/21. 2. Pain secondary to #1 with associated edema.  We discussed the rationale for an evaluation with physical therapy for baseline assessment if the patient does develop chronic lymphedema following surgery and to try and see if there are any other compressive massage techniques to teach the patient to try at home since this area cannot be wrapped with [place] boots during radiation.  We also discussed the  utility of NSAIDs in case he does have an early developing vasculitis, and for better pain management.  In a visit lasting 60 minutes, greater than 50% of the time was spent face to face discussing the patient's condition, in preparation for the discussion, and coordinating the patient's care.  The above documentation reflects my direct findings during this shared patient visit. Please see the separate note by Dr. Lisbeth Renshaw on this date for the remainder of the patient's plan of care.    Carola Rhine, [facility]   **Disclaimer: This note was dictated with voice recognition software. Similar sounding words can inadvertently be transcribed and this note may contain transcription errors which may not have been corrected upon publication of note.**

## 2021-01-06 ENCOUNTER — Ambulatory Visit
Admission: RE | Admit: 2021-01-06 | Discharge: 2021-01-06 | Disposition: A | Discharge status: Home / Self Care | Payer: Medicare Other | Source: Ambulatory Visit | Attending: Radiation Oncology | Admitting: Radiation Oncology

## 2021-01-06 DIAGNOSIS — C7652 Malignant neoplasm of left lower limb: Secondary | ICD-10-CM | POA: Insufficient documentation

## 2021-01-06 DIAGNOSIS — Z510 Encounter for antineoplastic radiation therapy: Secondary | ICD-10-CM | POA: Diagnosis not present

## 2021-01-11 ENCOUNTER — Telehealth: Payer: Self-pay | Admitting: *Deleted

## 2021-01-11 NOTE — Telephone Encounter (Signed)
Called patient to inform of appt. with [facility] on 01-25-21 - arrival time- 7:45 am, address- [address] [place], phone number [phone], spoke with patient and he is aware of this appt.

## 2021-01-14 DIAGNOSIS — Z510 Encounter for antineoplastic radiation therapy: Secondary | ICD-10-CM | POA: Diagnosis not present

## 2021-01-17 ENCOUNTER — Ambulatory Visit
Admission: RE | Admit: 2021-01-17 | Discharge: 2021-01-17 | Disposition: A | Discharge status: Home / Self Care | Payer: Medicare Other | Source: Ambulatory Visit | Attending: Radiation Oncology | Admitting: Radiation Oncology

## 2021-01-17 ENCOUNTER — Other Ambulatory Visit: Payer: Self-pay

## 2021-01-17 DIAGNOSIS — Z510 Encounter for antineoplastic radiation therapy: Secondary | ICD-10-CM | POA: Diagnosis not present

## 2021-01-18 ENCOUNTER — Ambulatory Visit
Admission: RE | Admit: 2021-01-18 | Discharge: 2021-01-18 | Disposition: A | Discharge status: Home / Self Care | Payer: Medicare Other | Source: Ambulatory Visit | Attending: Radiation Oncology | Admitting: Radiation Oncology

## 2021-01-18 DIAGNOSIS — Z510 Encounter for antineoplastic radiation therapy: Secondary | ICD-10-CM | POA: Diagnosis not present

## 2021-01-19 ENCOUNTER — Ambulatory Visit
Admission: RE | Admit: 2021-01-19 | Discharge: 2021-01-19 | Disposition: A | Discharge status: Home / Self Care | Payer: Medicare Other | Source: Ambulatory Visit | Attending: Radiation Oncology | Admitting: Radiation Oncology

## 2021-01-19 ENCOUNTER — Other Ambulatory Visit: Payer: Self-pay

## 2021-01-19 DIAGNOSIS — Z510 Encounter for antineoplastic radiation therapy: Secondary | ICD-10-CM | POA: Diagnosis not present

## 2021-01-20 ENCOUNTER — Ambulatory Visit
Admission: RE | Admit: 2021-01-20 | Discharge: 2021-01-20 | Disposition: A | Discharge status: Home / Self Care | Payer: Medicare Other | Source: Ambulatory Visit | Attending: Radiation Oncology | Admitting: Radiation Oncology

## 2021-01-20 ENCOUNTER — Other Ambulatory Visit: Payer: Self-pay

## 2021-01-20 DIAGNOSIS — Z510 Encounter for antineoplastic radiation therapy: Secondary | ICD-10-CM | POA: Diagnosis not present

## 2021-01-21 ENCOUNTER — Ambulatory Visit
Admission: RE | Admit: 2021-01-21 | Discharge: 2021-01-21 | Disposition: A | Discharge status: Home / Self Care | Payer: Medicare Other | Source: Ambulatory Visit | Attending: Radiation Oncology | Admitting: Radiation Oncology

## 2021-01-21 DIAGNOSIS — Z510 Encounter for antineoplastic radiation therapy: Secondary | ICD-10-CM | POA: Diagnosis not present

## 2021-01-25 ENCOUNTER — Other Ambulatory Visit: Payer: Self-pay

## 2021-01-25 ENCOUNTER — Ambulatory Visit
Admission: RE | Admit: 2021-01-25 | Discharge: 2021-01-25 | Disposition: A | Discharge status: Home / Self Care | Payer: Medicare Other | Source: Ambulatory Visit | Attending: Radiation Oncology | Admitting: Radiation Oncology

## 2021-01-25 ENCOUNTER — Encounter: Payer: Self-pay | Admitting: Physical Therapy

## 2021-01-25 ENCOUNTER — Ambulatory Visit: Payer: Medicare Other | Attending: Radiation Oncology | Admitting: Physical Therapy

## 2021-01-25 DIAGNOSIS — I890 Lymphedema, not elsewhere classified: Secondary | ICD-10-CM | POA: Insufficient documentation

## 2021-01-25 DIAGNOSIS — Z510 Encounter for antineoplastic radiation therapy: Secondary | ICD-10-CM | POA: Diagnosis not present

## 2021-01-25 DIAGNOSIS — M79605 Pain in left leg: Secondary | ICD-10-CM | POA: Insufficient documentation

## 2021-01-25 NOTE — Therapy (Signed)
[place], [place], [address] Phone: [phone]   Fax:  [phone]  Physical Therapy Evaluation  Patient Details  Name: Joseph Werner MRN: [id number] Date of Birth: 1935/02/17 Referring Provider (PT): Shona Simpson   Encounter Date: 01/25/2021   PT End of Session - 01/25/21 0934    Visit Number 1    Number of Visits 9    Date for PT Re-Evaluation 02/22/21    PT Start Time 0800    PT Stop Time 0915    PT Time Calculation (min) 75 min    Activity Tolerance Patient tolerated treatment well    Behavior During Therapy [facility] for tasks assessed/performed           Past Medical History:  Diagnosis Date  . Anemia    macrocytic anemia  . Arthritis   . Bradycardia    chronic  . CAD (coronary artery disease)   . Dyslipidemia   . Eczema   . Hyperlipidemia   . Hypothyroid   . Loss of weight   . Macrocytosis    with normal bone marrow biopsy in 2006 may be benign varient, evaluated in the pastby hematologistDr. Benay Spice  . Osteopenia    on bone density test from orthopedist Dr. Nelva Bush, bone density test in July 2010 showed T score-1.1 in the lumbar spine and 2.1 in the femur. Bone density in August 2012 showed T score 2.3 in the femur and 2.2 in the forearm  . Postsurgical aortocoronary bypass status   . Prostate cancer ([place])    H/O  . Prostate cancer [facility])    followed by urology Dr. Diona Fanti    Past Surgical History:  Procedure Laterality Date  . APPENDECTOMY    . CHOLECYSTECTOMY  1982  . CORONARY ARTERY BYPASS GRAFT  1998  . HERNIA REPAIR    . INGUINAL HERNIA REPAIR Right 11/18/2012   Procedure: HERNIA REPAIR INGUINAL ADULT;  Surgeon: Merrie Roof, MD;  Location: [place];  Service: General;  Laterality: Right;  . INSERTION OF MESH Right 11/18/2012   Procedure: INSERTION OF MESH;  Surgeon: Merrie Roof, MD;  Location: [place];  Service: General;  Laterality: Right;  . PROSTATECTOMY       There were no vitals filed for this visit.    Subjective Assessment - 01/25/21 0805    Subjective I have been having swelling in my leg since the first week of February. I did not used to have to walk with a cane. I just started several days ago. I spend a lot of the day with my feet propped up on four pillows to keep the swelling down. The swelling goes down to my foot but the cancer is in my thigh. The pain in my leg makes me breathe more often.    Pertinent History L thigh sarcoma, currently undergoing radiation, plan is to undergo surgery - 30 days later, heart bypass surgery, gallbladder removed, prostate cancer    Patient Stated Goals to wake up one morning without any pain and be able to walk across the room like nothing happened    Currently in Pain? Yes    Pain Score 10-Worst pain ever    Pain Location Leg    Pain Orientation Left;Upper;Posterior;Lateral    Pain Descriptors / Indicators Sharp;Heaviness;Constant    Pain Type Acute pain    Pain Onset More than a month ago    Pain Frequency Constant    Aggravating Factors  sitting in a chair where the chair hits it,    Pain Relieving Factors propping feet up    Effect of Pain on Daily Activities very painful to get out of the chair              [facility] PT Assessment - 01/25/21 0001      Assessment   Medical Diagnosis L thigh sarcoma    Referring Provider (PT) Shona Simpson    Onset Date/Surgical Date 11/26/20    Hand Dominance Right    Prior Therapy none      Precautions   Precautions Other (comment)    Precaution Comments active cancer      Restrictions   Weight Bearing Restrictions No      Balance Screen   Has the patient fallen in the past 6 months No    Has the patient had a decrease in activity level because of a fear of falling?  No    Is the patient reluctant to leave their home because of a fear of falling?  No      Home Ecologist residence    Living Arrangements  Spouse/significant other    Available Help at Discharge Family    Type of [place] to enter    Entrance Stairs-Number of Steps 3    Entrance Stairs-Rails Right    [place] Two level;Bed/bath upstairs      Prior Function   Level of [place] device for independence    Vocation Retired    Leisure has not been exercising other than walking from parking lot to cancer center, used to regularly go to the gym - stopped about 2 weeks ago due to pain in leg      Cognition   Overall Cognitive Status Within Functional Limits for tasks assessed      Observation/Other Assessments   Observations Left leg visibly swollen from foot to thigh      Ambulation/Gait   Ambulation/Gait Yes    Ambulation/Gait Assistance 6: Modified independent (Device/Increase time)    Ambulation Distance (Feet) 10 Feet    Assistive device Straight cane             LYMPHEDEMA/ONCOLOGY QUESTIONNAIRE - 01/25/21 0001      Type   Cancer Type L thigh sarcoma      Lymphedema Assessments   Lymphedema Assessments Lower extremities      Right Lower Extremity Lymphedema   30 cm Proximal to Suprapatella 55.5 cm    20 cm Proximal to Suprapatella 51 cm    10 cm Proximal to Suprapatella 44.2 cm    At Midpatella/Popliteal Crease 39 cm    30 cm Proximal to Floor at Lateral Plantar Foot 36.5 cm    20 cm Proximal to Floor at Lateral Plantar Foot '26 1    10 ' cm Proximal to Floor at Lateral Malleoli 26 cm    5 cm Proximal to 1st MTP Joint 23.8 cm    Across MTP Joint 24.5 cm    Around Proximal Great Toe 8.7 cm      Left Lower Extremity Lymphedema   30 cm Proximal to Suprapatella 63.5 cm    20 cm Proximal to Suprapatella 63 cm    10 cm Proximal to Suprapatella 53.5 cm    At Midpatella/Popliteal Crease 43.2 cm    30 cm Proximal to Floor at Lateral Plantar Foot 39.8 cm    20 cm Proximal  to Floor at Lateral Plantar Foot 28 cm    10 cm Proximal to Floor at Lateral Malleoli 30 cm     5 cm Proximal to 1st MTP Joint 28 cm    Across MTP Joint 27.5 cm    Around Proximal Great Toe 9 cm                   Objective measurements completed on examination: See above findings.       [facility] Adult PT Treatment/Exercise - 01/25/21 0001      Manual Therapy   Manual Therapy Edema management    Edema Management educated pt about different types of compression garments including stockings and velcro, cut a small piece of TG soft from foot to knee and a medium piece from knee to thigh, also educated pt about obtaining a pair of compression leggings to try                  PT Education - 01/25/21 0944    Education Details anatomy and physiology of lymphatic system, commercially available compression tights, different types of compression garments    Person(s) Educated Patient;Spouse    Methods Explanation    Comprehension Verbalized understanding               PT Long Term Goals - 01/25/21 0942      PT LONG TERM GOAL #1   Title Pt and/or spouse will be independent in self MLD for long term management of lymphedema.    Time 4    Period Weeks    Status New    Target Date 02/22/21      PT LONG TERM GOAL #2   Title Pt will obtain appropriate compression garments for long term management of lymphedema.    Time 4    Period Weeks    Status New    Target Date 02/22/21      PT LONG TERM GOAL #3   Title Pt will demonstrate a 3 cm decrease in circumference at 20 cm proximal to L suprapatella to decrease risk of infection.    Baseline 63 cm    Time 4    Period Weeks    Status New    Target Date 02/22/21      PT LONG TERM GOAL #4   Title Pt will report a 50% improvement in overall pain in LLE to allow improved comfort.    Time 4    Period Weeks    Status New    Target Date 02/22/21                  Plan - 01/25/21 0934    Clinical Impression Statement Pt reports to PT with L LE lymphedema as a result of extrinsic vessel compression  fron L thigh sarcoma. Pt reports the swelling began the first week of Feb. He used to be very active and was going to the gym daily. Just recently he started walking with a straight cane. He has increased pain especially when going from sit to stand. His gait is slowed. He tried to wear a compression garment but it was too painful. Pt is currently undergoing radiation so compression bandages are not possible at this time due to having to remove them to fit in to the mold for radiation. Pt does not feel he can tolerate a compression stocking at this time. He would have to try to don it when his leg is maximally reduced after he has  had several days of elevation. Elevation works well to decrease his swelling. A velcro garment would be the best option because pt can don/doff around his radiation schedule. Pt would like to give radiation a couple of weeks to see if the tumor will shrink and if the swelling will go down before investing in a velcro garment. Cut TG soft for pt to begin wearing today to help decrease swelling. Educated pt about commericailly available compression stockings. Pt would benefit from skilled PT services to decrease LLE lymphedema, instruct pt and spouse in self MLD, and assist pt with obtaining appropriate compression garments.    Personal Factors and Comorbidities Age;Comorbidity 1    Comorbidities previous hx of prostate cancer    Examination-Activity Limitations Stand;Sit    Examination-Participation Restrictions Driving;Yard Work;Community Activity    Stability/Clinical Decision Making Stable/Uncomplicated    Clinical Decision Making Low    Rehab Potential Good    PT Frequency 2x / week    PT Duration 4 weeks    PT Treatment/Interventions ADLs/Self Care Home Management;Therapeutic exercise;Therapeutic activities;Patient/family education;Manual techniques;Manual lymph drainage;Compression bandaging;Taping;Vasopneumatic Device    PT Next Visit Plan begin MLD to LLE and instruct pt  and spouse, remeasure, see if swelling goes down over the next week or so and if not talk about circaid juxtafit - did pt find compression tights?    Consulted and Agree with Plan of Care Patient           Patient will benefit from skilled therapeutic intervention in order to improve the following deficits and impairments:  Pain,Increased edema,Decreased mobility  Visit Diagnosis: Lymphedema, not elsewhere classified  Pain in left leg     Problem List Patient Active Problem List   Diagnosis Date Noted  . Sarcoma of left thigh ([place]) 01/05/2021  . Need for follow-up by home health service 06/08/2020  . Atherosclerosis of native coronary artery of native heart without angina pectoris 02/24/2014  . S/P CABG (coronary artery bypass graft) 02/24/2014  . Bradycardia 02/24/2014  . Right inguinal hernia 10/21/2012    Allyson Sabal [facility] 01/25/2021, 9:45 AM  [place], [place], [address] Phone: [phone]   Fax:  [phone]  Name: Joseph Werner MRN: [id number] Date of Birth: 1934-09-26  Manus Gunning, PT 01/25/21 9:45 AM

## 2021-01-26 ENCOUNTER — Ambulatory Visit
Admission: RE | Admit: 2021-01-26 | Discharge: 2021-01-26 | Disposition: A | Discharge status: Home / Self Care | Payer: Medicare Other | Source: Ambulatory Visit | Attending: Radiation Oncology | Admitting: Radiation Oncology

## 2021-01-26 DIAGNOSIS — C7652 Malignant neoplasm of left lower limb: Secondary | ICD-10-CM | POA: Insufficient documentation

## 2021-01-26 DIAGNOSIS — Z510 Encounter for antineoplastic radiation therapy: Secondary | ICD-10-CM | POA: Diagnosis not present

## 2021-01-27 ENCOUNTER — Other Ambulatory Visit: Payer: Self-pay

## 2021-01-27 ENCOUNTER — Ambulatory Visit
Admission: RE | Admit: 2021-01-27 | Discharge: 2021-01-27 | Disposition: A | Discharge status: Home / Self Care | Payer: Medicare Other | Source: Ambulatory Visit | Attending: Radiation Oncology | Admitting: Radiation Oncology

## 2021-01-27 DIAGNOSIS — Z510 Encounter for antineoplastic radiation therapy: Secondary | ICD-10-CM | POA: Diagnosis not present

## 2021-01-28 ENCOUNTER — Ambulatory Visit
Admission: RE | Admit: 2021-01-28 | Discharge: 2021-01-28 | Disposition: A | Discharge status: Home / Self Care | Payer: Medicare Other | Source: Ambulatory Visit | Attending: Radiation Oncology | Admitting: Radiation Oncology

## 2021-01-28 ENCOUNTER — Other Ambulatory Visit: Payer: Self-pay | Admitting: Radiation Oncology

## 2021-01-28 DIAGNOSIS — Z510 Encounter for antineoplastic radiation therapy: Secondary | ICD-10-CM | POA: Diagnosis not present

## 2021-01-28 MED ORDER — TRAMADOL HCL 50 MG PO TABS: 50.0000 mg | ORAL_TABLET | Freq: Four times a day (QID) | ORAL | 0 refills | Status: DC | PRN

## 2021-01-31 ENCOUNTER — Ambulatory Visit
Admission: RE | Admit: 2021-01-31 | Discharge: 2021-01-31 | Disposition: A | Discharge status: Home / Self Care | Payer: Medicare Other | Source: Ambulatory Visit | Attending: Radiation Oncology | Admitting: Radiation Oncology

## 2021-01-31 ENCOUNTER — Other Ambulatory Visit: Payer: Self-pay

## 2021-01-31 DIAGNOSIS — Z510 Encounter for antineoplastic radiation therapy: Secondary | ICD-10-CM | POA: Diagnosis not present

## 2021-02-01 ENCOUNTER — Ambulatory Visit
Admission: RE | Admit: 2021-02-01 | Discharge: 2021-02-01 | Disposition: A | Discharge status: Home / Self Care | Payer: Medicare Other | Source: Ambulatory Visit | Attending: Radiation Oncology | Admitting: Radiation Oncology

## 2021-02-01 DIAGNOSIS — Z510 Encounter for antineoplastic radiation therapy: Secondary | ICD-10-CM | POA: Diagnosis not present

## 2021-02-02 ENCOUNTER — Other Ambulatory Visit: Payer: Self-pay

## 2021-02-02 ENCOUNTER — Ambulatory Visit
Admission: RE | Admit: 2021-02-02 | Discharge: 2021-02-02 | Disposition: A | Discharge status: Home / Self Care | Payer: Medicare Other | Source: Ambulatory Visit | Attending: Radiation Oncology | Admitting: Radiation Oncology

## 2021-02-02 DIAGNOSIS — Z510 Encounter for antineoplastic radiation therapy: Secondary | ICD-10-CM | POA: Diagnosis not present

## 2021-02-03 ENCOUNTER — Ambulatory Visit
Admission: RE | Admit: 2021-02-03 | Discharge: 2021-02-03 | Disposition: A | Discharge status: Home / Self Care | Payer: Medicare Other | Source: Ambulatory Visit | Attending: Radiation Oncology | Admitting: Radiation Oncology

## 2021-02-03 DIAGNOSIS — Z510 Encounter for antineoplastic radiation therapy: Secondary | ICD-10-CM | POA: Diagnosis not present

## 2021-02-04 ENCOUNTER — Ambulatory Visit
Admission: RE | Admit: 2021-02-04 | Discharge: 2021-02-04 | Disposition: A | Discharge status: Home / Self Care | Payer: Medicare Other | Source: Ambulatory Visit | Attending: Radiation Oncology | Admitting: Radiation Oncology

## 2021-02-04 ENCOUNTER — Other Ambulatory Visit: Payer: Self-pay

## 2021-02-04 DIAGNOSIS — Z510 Encounter for antineoplastic radiation therapy: Secondary | ICD-10-CM | POA: Diagnosis not present

## 2021-02-07 ENCOUNTER — Ambulatory Visit
Admission: RE | Admit: 2021-02-07 | Discharge: 2021-02-07 | Disposition: A | Discharge status: Home / Self Care | Payer: Medicare Other | Source: Ambulatory Visit | Attending: Radiation Oncology | Admitting: Radiation Oncology

## 2021-02-07 DIAGNOSIS — Z510 Encounter for antineoplastic radiation therapy: Secondary | ICD-10-CM | POA: Diagnosis not present

## 2021-02-08 ENCOUNTER — Ambulatory Visit
Admission: RE | Admit: 2021-02-08 | Discharge: 2021-02-08 | Disposition: A | Discharge status: Home / Self Care | Payer: Medicare Other | Source: Ambulatory Visit | Attending: Radiation Oncology | Admitting: Radiation Oncology

## 2021-02-08 ENCOUNTER — Other Ambulatory Visit: Payer: Self-pay

## 2021-02-08 ENCOUNTER — Encounter: Payer: Self-pay | Admitting: Physical Therapy

## 2021-02-08 ENCOUNTER — Ambulatory Visit: Payer: Medicare Other | Attending: Radiation Oncology | Admitting: Physical Therapy

## 2021-02-08 DIAGNOSIS — M79605 Pain in left leg: Secondary | ICD-10-CM | POA: Diagnosis present

## 2021-02-08 DIAGNOSIS — I890 Lymphedema, not elsewhere classified: Secondary | ICD-10-CM

## 2021-02-08 DIAGNOSIS — Z510 Encounter for antineoplastic radiation therapy: Secondary | ICD-10-CM | POA: Diagnosis not present

## 2021-02-08 NOTE — Therapy (Signed)
[place], [place], [address] Phone: [phone]   Fax:  [phone]  Physical Therapy Treatment  Patient Details  Name: Joseph Werner MRN: [id number] Date of Birth: 1935/03/20 Referring Provider (PT): Shona Simpson   Encounter Date: 02/08/2021   PT End of Session - 02/08/21 1204     Visit Number 2    Number of Visits 9    Date for PT Re-Evaluation 02/22/21    PT Start Time 1105    PT Stop Time 1204    PT Time Calculation (min) 59 min    Activity Tolerance Patient tolerated treatment well    Behavior During Therapy [facility] for tasks assessed/performed             Past Medical History:  Diagnosis Date   Anemia    macrocytic anemia   Arthritis    Bradycardia    chronic   CAD (coronary artery disease)    Dyslipidemia    Eczema    Hyperlipidemia    Hypothyroid    Loss of weight    Macrocytosis    with normal bone marrow biopsy in 2006 may be benign varient, evaluated in the pastby hematologistDr. Benay Spice   Osteopenia    on bone density test from orthopedist Dr. Nelva Bush, bone density test in July 2010 showed T score-1.1 in the lumbar spine and 2.1 in the femur. Bone density in August 2012 showed T score 2.3 in the femur and 2.2 in the forearm   Postsurgical aortocoronary bypass status    Prostate cancer [facility])    H/O   Prostate cancer [facility])    followed by urology Dr. Diona Fanti    Past Surgical History:  Procedure Laterality Date   [place] Right 11/18/2012   Procedure: HERNIA REPAIR INGUINAL ADULT;  Surgeon: Merrie Roof, MD;  Location: [place];  Service: General;  Laterality: Right;   INSERTION OF MESH Right 11/18/2012   Procedure: INSERTION OF MESH;  Surgeon: Merrie Roof, MD;  Location: [place];  Service: General;  Laterality: Right;   PROSTATECTOMY      There were no  vitals filed for this visit.   Subjective Assessment - 02/08/21 1106     Subjective The swelling is up and down. Last week the pain was nominal the swelling was not as bad. I was able to get my foot to where the swelling was down like normal. Right after treatment it feels like there is a band of something squeezing my leg.    Pertinent History L thigh sarcoma, currently undergoing radiation, plan is to undergo surgery - 30 days later, heart bypass surgery, gallbladder removed, prostate cancer    Patient Stated Goals to wake up one morning without any pain and be able to walk across the room like nothing happened    Currently in Pain? Yes    Pain Score 6     Pain Location Leg    Pain Orientation Left;Upper    Pain Descriptors / Indicators Sharp;Heaviness    Pain Onset More than a month ago    Pain Frequency Intermittent    Aggravating Factors  standing up    Pain Relieving Factors propping feet up    Effect of Pain on Daily Activities painful to stand and stand up from chair  [place] Adult PT Treatment/Exercise - 02/08/21 0001       Manual Therapy   Manual Therapy Manual Lymphatic Drainage (MLD)    Manual Lymphatic Drainage (MLD) in supine with HOB elevated: short neck, 5 diaphragmatic breaths, left axillary nodes, establishment of axillo inguinal pathway, left inguinal nodes, LLE working proximal to distal then retracing all steps with verbally instructing pt and his wife in correct skin stretch technique and anatomy and physiology of the lymphatic system                         PT Long Term Goals - 01/25/21 0942       PT LONG TERM GOAL #1   Title Pt and/or spouse will be independent in self MLD for long term management of lymphedema.    Time 4    Period Weeks    Status New    Target Date 02/22/21      PT LONG TERM GOAL #2   Title Pt will obtain appropriate compression garments for long term management of  lymphedema.    Time 4    Period Weeks    Status New    Target Date 02/22/21      PT LONG TERM GOAL #3   Title Pt will demonstrate a 3 cm decrease in circumference at 20 cm proximal to L suprapatella to decrease risk of infection.    Baseline 63 cm    Time 4    Period Weeks    Status New    Target Date 02/22/21      PT LONG TERM GOAL #4   Title Pt will report a 50% improvement in overall pain in LLE to allow improved comfort.    Time 4    Period Weeks    Status New    Target Date 02/22/21                   Plan - 02/08/21 1208     Clinical Impression Statement Pt returns to PT and states it took him an hour to apply TG soft to his leg and would like it trimmed shorter. Therapist made TG soft shorter and educated pt that if it is difficult to apply he does not have to wear it since it provides little compression. Pt reports last week his leg swelling was down but this week it is back. He completes radiation on the 27th and will be able to begin compression bandaging after that. Pt is agreeable to compression bandaging. Began MLD to LLE today while educating pt and his wife on basic principles.    PT Frequency 2x / week    PT Duration 4 weeks    PT Next Visit Plan cont MLD to LLE and instruct pt and spouse, remeasure, see if swelling goes down over the next week or so and if not talk about circaid juxtafit - did pt find compression tights?    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 02/08/2021, 12:12 PM  [place], [place], [address] Phone: [phone]   Fax:  [phone]  Name: Joseph Werner MRN: [id number] Date of Birth: 06/09/1935   Manus Gunning, PT 02/08/21 12:12 PM

## 2021-02-09 ENCOUNTER — Other Ambulatory Visit: Payer: Self-pay

## 2021-02-09 ENCOUNTER — Ambulatory Visit: Payer: Medicare Other | Admitting: Physical Therapy

## 2021-02-09 ENCOUNTER — Ambulatory Visit
Admission: RE | Admit: 2021-02-09 | Discharge: 2021-02-09 | Disposition: A | Discharge status: Home / Self Care | Payer: Medicare Other | Source: Ambulatory Visit | Attending: Radiation Oncology | Admitting: Radiation Oncology

## 2021-02-09 ENCOUNTER — Encounter: Payer: Self-pay | Admitting: Physical Therapy

## 2021-02-09 DIAGNOSIS — I890 Lymphedema, not elsewhere classified: Secondary | ICD-10-CM

## 2021-02-09 DIAGNOSIS — M79605 Pain in left leg: Secondary | ICD-10-CM

## 2021-02-09 DIAGNOSIS — Z510 Encounter for antineoplastic radiation therapy: Secondary | ICD-10-CM | POA: Diagnosis not present

## 2021-02-09 NOTE — Therapy (Signed)
[place], [place], [address] Phone: [phone]   Fax:  [phone]  Physical Therapy Treatment  Patient Details  Name: Joseph Werner MRN: [id number] Date of Birth: 12-Jan-1935 Referring Provider (PT): Shona Simpson   Encounter Date: 02/09/2021   PT End of Session - 02/09/21 1358     Visit Number 3    Number of Visits 9    Date for PT Re-Evaluation 02/22/21    PT Start Time 1303    PT Stop Time 1354    PT Time Calculation (min) 51 min    Activity Tolerance Patient tolerated treatment well    Behavior During Therapy [facility] for tasks assessed/performed             Past Medical History:  Diagnosis Date   Anemia    macrocytic anemia   Arthritis    Bradycardia    chronic   CAD (coronary artery disease)    Dyslipidemia    Eczema    Hyperlipidemia    Hypothyroid    Loss of weight    Macrocytosis    with normal bone marrow biopsy in 2006 may be benign varient, evaluated in the pastby hematologistDr. Benay Spice   Osteopenia    on bone density test from orthopedist Dr. Nelva Bush, bone density test in July 2010 showed T score-1.1 in the lumbar spine and 2.1 in the femur. Bone density in August 2012 showed T score 2.3 in the femur and 2.2 in the forearm   Postsurgical aortocoronary bypass status    Prostate cancer [facility])    H/O   Prostate cancer [facility])    followed by urology Dr. Diona Fanti    Past Surgical History:  Procedure Laterality Date   [place] Right 11/18/2012   Procedure: HERNIA REPAIR INGUINAL ADULT;  Surgeon: Merrie Roof, MD;  Location: [place];  Service: General;  Laterality: Right;   INSERTION OF MESH Right 11/18/2012   Procedure: INSERTION OF MESH;  Surgeon: Merrie Roof, MD;  Location: [place];  Service: General;  Laterality: Right;   PROSTATECTOMY      There were no  vitals filed for this visit.   Subjective Assessment - 02/09/21 1306     Subjective I wore the TG soft and I was able to get it on easier after you cut it. I noticed that even though the TG soft doesn't compression much it does help with the pain.    Pertinent History L thigh sarcoma, currently undergoing radiation, plan is to undergo surgery - 30 days later, heart bypass surgery, gallbladder removed, prostate cancer    Patient Stated Goals to wake up one morning without any pain and be able to walk across the room like nothing happened    Currently in Pain? Yes    Pain Score 6     Pain Location Leg    Pain Orientation Left;Upper    Pain Descriptors / Indicators Sharp;Heaviness    Pain Type Acute pain    Pain Onset More than a month ago    Pain Frequency Intermittent    Aggravating Factors  standing up    Pain Relieving Factors propping feet up    Effect of Pain on Daily Activities painful to stand and stand up from chair  [facility] Adult PT Treatment/Exercise - 02/09/21 0001       Manual Therapy   Edema Management removed TG soft prior to MLD then redonned at end of session    Manual Lymphatic Drainage (MLD) in supine with HOB elevated: short neck, 5 diaphragmatic breaths, left axillary nodes, establishment of axillo inguinal pathway, left inguinal nodes, LLE working proximal to distal then retracing all steps                         PT Long Term Goals - 01/25/21 0942       PT LONG TERM GOAL #1   Title Pt and/or spouse will be independent in self MLD for long term management of lymphedema.    Time 4    Period Weeks    Status New    Target Date 02/22/21      PT LONG TERM GOAL #2   Title Pt will obtain appropriate compression garments for long term management of lymphedema.    Time 4    Period Weeks    Status New    Target Date 02/22/21      PT LONG TERM GOAL #3   Title Pt will demonstrate a 3 cm decrease in  circumference at 20 cm proximal to L suprapatella to decrease risk of infection.    Baseline 63 cm    Time 4    Period Weeks    Status New    Target Date 02/22/21      PT LONG TERM GOAL #4   Title Pt will report a 50% improvement in overall pain in LLE to allow improved comfort.    Time 4    Period Weeks    Status New    Target Date 02/22/21                   Plan - 02/09/21 1358     Clinical Impression Statement Continued with MLD to LLE today. Pt reports he feels the TG soft helps his pain levels despite the fact that it does not give much compression. He is now able to do don/doff easier since each piece was cut shorter and there is less overlap. Redonned TG soft at end of session. Will begin compression bandaging once pt completes radiation.    PT Frequency 2x / week    PT Duration 4 weeks    PT Treatment/Interventions ADLs/Self Care Home Management;Therapeutic exercise;Therapeutic activities;Patient/family education;Manual techniques;Manual lymph drainage;Compression bandaging;Taping;Vasopneumatic Device    PT Next Visit Plan cont MLD to LLE and instruct pt and spouse, remeasure, begin bandaging in July after completion of radation    Consulted and Agree with Plan of Care Patient             Patient will benefit from skilled therapeutic intervention in order to improve the following deficits and impairments:  Pain, Increased edema, Decreased mobility  Visit Diagnosis: Lymphedema, not elsewhere classified  Pain in left leg     Problem List Patient Active Problem List   Diagnosis Date Noted   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

## 2021-02-10 ENCOUNTER — Ambulatory Visit
Admission: RE | Admit: 2021-02-10 | Discharge: 2021-02-10 | Disposition: A | Discharge status: Home / Self Care | Payer: Medicare Other | Source: Ambulatory Visit | Attending: Radiation Oncology | Admitting: Radiation Oncology

## 2021-02-10 DIAGNOSIS — Z510 Encounter for antineoplastic radiation therapy: Secondary | ICD-10-CM | POA: Diagnosis not present

## 2021-02-11 ENCOUNTER — Ambulatory Visit
Admission: RE | Admit: 2021-02-11 | Discharge: 2021-02-11 | Disposition: A | Discharge status: Home / Self Care | Payer: Medicare Other | Source: Ambulatory Visit | Attending: Radiation Oncology | Admitting: Radiation Oncology

## 2021-02-11 ENCOUNTER — Other Ambulatory Visit: Payer: Self-pay

## 2021-02-11 DIAGNOSIS — Z510 Encounter for antineoplastic radiation therapy: Secondary | ICD-10-CM | POA: Diagnosis not present

## 2021-02-14 ENCOUNTER — Other Ambulatory Visit: Payer: Self-pay

## 2021-02-14 ENCOUNTER — Ambulatory Visit
Admission: RE | Admit: 2021-02-14 | Discharge: 2021-02-14 | Disposition: A | Discharge status: Home / Self Care | Payer: Medicare Other | Source: Ambulatory Visit | Attending: Radiation Oncology | Admitting: Radiation Oncology

## 2021-02-14 DIAGNOSIS — Z510 Encounter for antineoplastic radiation therapy: Secondary | ICD-10-CM | POA: Diagnosis not present

## 2021-02-15 ENCOUNTER — Ambulatory Visit
Admission: RE | Admit: 2021-02-15 | Discharge: 2021-02-15 | Disposition: A | Discharge status: Home / Self Care | Payer: Medicare Other | Source: Ambulatory Visit | Attending: Radiation Oncology | Admitting: Radiation Oncology

## 2021-02-15 DIAGNOSIS — Z510 Encounter for antineoplastic radiation therapy: Secondary | ICD-10-CM | POA: Diagnosis not present

## 2021-02-16 ENCOUNTER — Other Ambulatory Visit: Payer: Self-pay

## 2021-02-16 ENCOUNTER — Ambulatory Visit
Admission: RE | Admit: 2021-02-16 | Discharge: 2021-02-16 | Disposition: A | Discharge status: Home / Self Care | Payer: Medicare Other | Source: Ambulatory Visit | Attending: Radiation Oncology | Admitting: Radiation Oncology

## 2021-02-16 DIAGNOSIS — Z510 Encounter for antineoplastic radiation therapy: Secondary | ICD-10-CM | POA: Diagnosis not present

## 2021-02-17 ENCOUNTER — Ambulatory Visit
Admission: RE | Admit: 2021-02-17 | Discharge: 2021-02-17 | Disposition: A | Discharge status: Home / Self Care | Payer: Medicare Other | Source: Ambulatory Visit | Attending: Radiation Oncology | Admitting: Radiation Oncology

## 2021-02-17 DIAGNOSIS — Z510 Encounter for antineoplastic radiation therapy: Secondary | ICD-10-CM | POA: Diagnosis not present

## 2021-02-18 ENCOUNTER — Ambulatory Visit
Admission: RE | Admit: 2021-02-18 | Discharge: 2021-02-18 | Disposition: A | Discharge status: Home / Self Care | Payer: Medicare Other | Source: Ambulatory Visit | Attending: Radiation Oncology | Admitting: Radiation Oncology

## 2021-02-18 ENCOUNTER — Other Ambulatory Visit: Payer: Self-pay

## 2021-02-18 DIAGNOSIS — Z510 Encounter for antineoplastic radiation therapy: Secondary | ICD-10-CM | POA: Diagnosis not present

## 2021-02-21 ENCOUNTER — Ambulatory Visit: Payer: Medicare Other | Admitting: Physical Therapy

## 2021-02-21 ENCOUNTER — Encounter: Payer: Self-pay | Admitting: Radiation Oncology

## 2021-02-21 ENCOUNTER — Telehealth: Payer: Self-pay | Admitting: Radiation Oncology

## 2021-02-21 ENCOUNTER — Ambulatory Visit
Admission: RE | Admit: 2021-02-21 | Discharge: 2021-02-21 | Disposition: A | Discharge status: Home / Self Care | Payer: Medicare Other | Source: Ambulatory Visit | Attending: Radiation Oncology | Admitting: Radiation Oncology

## 2021-02-21 ENCOUNTER — Encounter: Payer: Self-pay | Admitting: Physical Therapy

## 2021-02-21 ENCOUNTER — Other Ambulatory Visit: Payer: Self-pay

## 2021-02-21 DIAGNOSIS — I890 Lymphedema, not elsewhere classified: Secondary | ICD-10-CM

## 2021-02-21 DIAGNOSIS — Z510 Encounter for antineoplastic radiation therapy: Secondary | ICD-10-CM | POA: Diagnosis not present

## 2021-02-21 DIAGNOSIS — R600 Localized edema: Secondary | ICD-10-CM

## 2021-02-21 DIAGNOSIS — C4922 Malignant neoplasm of connective and soft tissue of left lower limb, including hip: Secondary | ICD-10-CM

## 2021-02-21 DIAGNOSIS — M79605 Pain in left leg: Secondary | ICD-10-CM

## 2021-02-21 NOTE — Therapy (Signed)
Joseph Werner, [place], [address] Phone: [phone]   Fax:  [phone]  Physical Therapy Treatment  Patient Details  Name: Joseph Werner MRN: [id number] Date of Birth: May 06, 1935 Referring Provider (PT): Shona Simpson   Encounter Date: 02/21/2021   PT End of Session - 02/21/21 1452     Visit Number 4    Number of Visits 9    Date for PT Re-Evaluation 02/22/21    PT Start Time 1406    PT Stop Time 1500    PT Time Calculation (min) 54 min    Activity Tolerance Patient tolerated treatment well    Behavior During Therapy [facility] for tasks assessed/performed             Past Medical History:  Diagnosis Date   Anemia    macrocytic anemia   Arthritis    Bradycardia    chronic   CAD (coronary artery disease)    Dyslipidemia    Eczema    Hyperlipidemia    Hypothyroid    Loss of weight    Macrocytosis    with normal bone marrow biopsy in 2006 may be benign varient, evaluated in the pastby hematologistDr. Benay Spice   Osteopenia    on bone density test from orthopedist Dr. Nelva Bush, bone density test in July 2010 showed T score-1.1 in the lumbar spine and 2.1 in the femur. Bone density in August 2012 showed T score 2.3 in the femur and 2.2 in the forearm   Postsurgical aortocoronary bypass status    Prostate cancer [facility])    H/O   Prostate cancer [facility])    followed by urology Dr. Diona Fanti    Past Surgical History:  Procedure Laterality Date   [place] Right 11/18/2012   Procedure: HERNIA REPAIR INGUINAL ADULT;  Surgeon: Merrie Roof, MD;  Location: [place];  Service: General;  Laterality: Right;   INSERTION OF MESH Right 11/18/2012   Procedure: INSERTION OF MESH;  Surgeon: Merrie Roof, MD;  Location: [place];  Service: General;  Laterality: Right;   PROSTATECTOMY      There were no  vitals filed for this visit.   Subjective Assessment - 02/21/21 1407     Subjective The swelling is about the same. The doctor said the radiation did not affect any lymph nodes in the groin but I had prostate surgery in 1999 and I know they cut out some lymph nodes. The genitals started swelling right after the last session.    Pertinent History L thigh sarcoma, currently undergoing radiation, plan is to undergo surgery - 30 days later, heart bypass surgery, gallbladder removed, prostate cancer    Patient Stated Goals to wake up one morning without any pain and be able to walk across the room like nothing happened    Currently in Pain? No/denies    Pain Score 0-No pain                               [facility] Adult PT Treatment/Exercise - 02/21/21 0001       Manual Therapy   Edema Management discussed with pt signs and symptoms of cellulitis, took picture of red, swollen area on RLE and sent to Shona Simpson; pt also reports recent onset of scrotal  edema so created foam chip pack and educated pt to obtain biker shorts and wear chip pack in biker shorts    Manual Lymphatic Drainage (MLD) in supine with HOB elevated: short neck, 5 diaphragmatic breaths, left axillary nodes, establishment of axillo inguinal pathway, left inguinal nodes, LLE working proximal to distal then retracing all steps                    PT Education - 02/21/21 1501     Education Details use chip pack in biker shorts, obtain biker shorts, try to elevate scrotum at home, keep both legs elevated, seek medical attention with any shortness of breath or if redness on lower leg worsens, signs of cellulitis    Person(s) Educated Patient;Spouse    Methods Explanation    Comprehension Verbalized understanding                 PT Long Term Goals - 01/25/21 0942       PT LONG TERM GOAL #1   Title Pt and/or spouse will be independent in self MLD for long term management of lymphedema.    Time  4    Period Weeks    Status New    Target Date 02/22/21      PT LONG TERM GOAL #2   Title Pt will obtain appropriate compression garments for long term management of lymphedema.    Time 4    Period Weeks    Status New    Target Date 02/22/21      PT LONG TERM GOAL #3   Title Pt will demonstrate a 3 cm decrease in circumference at 20 cm proximal to L suprapatella to decrease risk of infection.    Baseline 63 cm    Time 4    Period Weeks    Status New    Target Date 02/22/21      PT LONG TERM GOAL #4   Title Pt will report a 50% improvement in overall pain in LLE to allow improved comfort.    Time 4    Period Weeks    Status New    Target Date 02/22/21                   Plan - 02/21/21 1454     Clinical Impression Statement Pt arrived to therapy today after completion of radiation today. He still has soft, pitting edema throughout his LLE but now his RLE is also swelling and is larger than the L. His anterior lower left leg has a red patch with map like borders. Discussed cellulitis with patient and sent an inbasket to Shona Simpson with a picture to see if he needs to be seen. Pt also reports his scrotum has started swelling as well. Created foam chip pack for pt to wear in his underwear to help with scrotal edema and educated pt to obtain compression shorts. Perfomed MLD to LLE to help reduce fluid and will begin light bandaging at next session to avoid moving too much fluid at once.    PT Frequency 2x / week    PT Duration 4 weeks    PT Treatment/Interventions ADLs/Self Care Home Management;Therapeutic exercise;Therapeutic activities;Patient/family education;Manual techniques;Manual lymph drainage;Compression bandaging;Taping;Vasopneumatic Device    PT Next Visit Plan ask about scrotal edema and see if he purchased compression shorts, cont MLD to LLE and instruct pt and spouse, remeasure, begin bandaging in July after completion of radation begin with only 1 leg and 1  layer    Consulted and Agree with Plan of Care Patient             Patient will benefit from skilled therapeutic intervention in order to improve the following deficits and impairments:  Pain, Increased edema, Decreased mobility  Visit Diagnosis: Lymphedema, not elsewhere classified  Pain in left leg     Problem List Patient Active Problem List   Diagnosis Date Noted   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

## 2021-02-21 NOTE — Telephone Encounter (Signed)
I received a message from PT today that the patient has new R lower extremity edema and redness that was concerning. I tried calling the patient and he is not having pain, fevers, chills, chest pain, or shortness of breath. He's had some trouble in the past following his prostate removal with bilateral lower extremity edema. He finished his LLE treatment today and has been having PT evaluations for this and will start wrapping. The picture that was taken by PT is suspicious for vasculitis so he will go for doppler ultrasound tomorrow. He's counseled to go to the ED if he develops RLE pain, or shortness of breath. He's in agreement with this plan.

## 2021-02-22 ENCOUNTER — Telehealth: Payer: Self-pay | Admitting: Radiation Oncology

## 2021-02-22 ENCOUNTER — Ambulatory Visit (HOSPITAL_COMMUNITY)
Admission: RE | Admit: 2021-02-22 | Discharge: 2021-02-22 | Disposition: A | Discharge status: Home / Self Care | Payer: Medicare Other | Source: Ambulatory Visit | Attending: Radiation Oncology | Admitting: Radiation Oncology

## 2021-02-22 DIAGNOSIS — C4922 Malignant neoplasm of connective and soft tissue of left lower limb, including hip: Secondary | ICD-10-CM | POA: Diagnosis present

## 2021-02-22 DIAGNOSIS — R600 Localized edema: Secondary | ICD-10-CM | POA: Diagnosis not present

## 2021-02-22 NOTE — Telephone Encounter (Signed)
I called but got the patient's voicemail. I let him know his doppler u/s of the RLE was negative for DVT, vasculitis or phelbitis. We will follow this expectantly.

## 2021-02-22 NOTE — Telephone Encounter (Signed)
Received call from vascular clinic, u/s negative for DVT or phlebitis/vasculitis.

## 2021-02-22 NOTE — Progress Notes (Signed)
Right lower extremity venous duplex has been completed. Preliminary results can be found in CV Proc through chart review.  Results were given to Shona Simpson PA.  02/22/21 10:48 AM Carlos Levering RVT

## 2021-02-24 NOTE — Progress Notes (Addendum)
                                                                                                                                                             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  Plan: The patient will receive a call in about one month from the radiation oncology department. He will continue follow up with Dr. Sherlynn Stalls at [facility] as well.   ________________________________________________    Carola Rhine, [facility]

## 2021-03-02 ENCOUNTER — Other Ambulatory Visit: Payer: Self-pay

## 2021-03-02 ENCOUNTER — Encounter: Payer: Self-pay | Admitting: Physical Therapy

## 2021-03-02 ENCOUNTER — Ambulatory Visit: Payer: Medicare Other | Attending: Radiation Oncology | Admitting: Physical Therapy

## 2021-03-02 DIAGNOSIS — I890 Lymphedema, not elsewhere classified: Secondary | ICD-10-CM

## 2021-03-02 DIAGNOSIS — M79605 Pain in left leg: Secondary | ICD-10-CM | POA: Diagnosis present

## 2021-03-02 NOTE — Therapy (Signed)
[place], [place], [address] Phone: [phone]   Fax:  [phone]  Physical Therapy Treatment  Patient Details  Name: Joseph Werner MRN: [id number] Date of Birth: [birth year] Referring Provider (PT): Shona Simpson   Encounter Date: 03/02/2021   PT End of Session - 03/02/21 1705     Visit Number 5    Number of Visits 9    Date for PT Re-Evaluation 02/22/21    PT Start Time 1603    PT Stop Time 1700    PT Time Calculation (min) 57 min    Activity Tolerance Patient tolerated treatment well    Behavior During Therapy [facility] for tasks assessed/performed             Past Medical History:  Diagnosis Date   Anemia    macrocytic anemia   Arthritis    Bradycardia    chronic   CAD (coronary artery disease)    Dyslipidemia    Eczema    Hyperlipidemia    Hypothyroid    Loss of weight    Macrocytosis    with normal bone marrow biopsy in 2006 may be benign varient, evaluated in the pastby hematologistDr. Benay Spice   Osteopenia    on bone density test from orthopedist Dr. Nelva Bush, bone density test in July 2010 showed T score-1.1 in the lumbar spine and 2.1 in the femur. Bone density in August 2012 showed T score 2.3 in the femur and 2.2 in the forearm   Postsurgical aortocoronary bypass status    Prostate cancer [facility])    H/O   Prostate cancer [facility])    followed by urology Dr. Diona Fanti    Past Surgical History:  Procedure Laterality Date   [place] Right 11/18/2012   Procedure: HERNIA REPAIR INGUINAL ADULT;  Surgeon: Merrie Roof, MD;  Location: [place];  Service: General;  Laterality: Right;   INSERTION OF MESH Right 11/18/2012   Procedure: INSERTION OF MESH;  Surgeon: Merrie Roof, MD;  Location: [place];  Service: General;  Laterality: Right;   PROSTATECTOMY      There were no  vitals filed for this visit.   Subjective Assessment - 03/02/21 1605     Subjective I did not have a blood clot. They did not find anything. It looks like the red place around my ankle is gone. The swelling in my R leg went down. It was down the next morning. It has not been that big since.    Pertinent History L thigh sarcoma, currently undergoing radiation, plan is to undergo surgery - 30 days later, heart bypass surgery, gallbladder removed, prostate cancer    Patient Stated Goals to wake up one morning without any pain and be able to walk across the room like nothing happened    Currently in Pain? No/denies    Pain Score 0-No pain                               [facility] Adult PT Treatment/Exercise - 03/02/21 0001       Manual Therapy   Manual Therapy Compression Bandaging    Manual Lymphatic Drainage (MLD) in supine with HOB elevated: short neck, 5 diaphragmatic breaths, left axillary nodes, establishment of axillo inguinal pathway, left inguinal  nodes, LLE working proximal to distal then retracing all steps    Compression Bandaging TG soft size medium from foot to knee, TG soft size large from knee to thigh, artiflex from foot to knee (next time add from knee to thigh), elastomull to digits 1-3, 1 6 cm short stretch at foot, 1 8 cm at ankle, 1 12 cm from ankle to knee, then 2 12 cm from just below knee to groin                         PT Long Term Goals - 01/25/21 8185       PT LONG TERM GOAL #1   Title Pt and/or spouse will be independent in self MLD for long term management of lymphedema.    Time 4    Period Weeks    Status New    Target Date 02/22/21      PT LONG TERM GOAL #2   Title Pt will obtain appropriate compression garments for long term management of lymphedema.    Time 4    Period Weeks    Status New    Target Date 02/22/21      PT LONG TERM GOAL #3   Title Pt will demonstrate a 3 cm decrease in circumference at 20 cm proximal to  L suprapatella to decrease risk of infection.    Baseline 63 cm    Time 4    Period Weeks    Status New    Target Date 02/22/21      PT LONG TERM GOAL #4   Title Pt will report a 50% improvement in overall pain in LLE to allow improved comfort.    Time 4    Period Weeks    Status New    Target Date 02/22/21                   Plan - 03/02/21 1706     Clinical Impression Statement Continued with MLD to LLE today. His R LE is still swollen but has gone down since last session. All tests were negative for DVT and phlebitis. Redness at anterior ankle has improved. Pt reports chip pack helped his groin swelling but he stopped wearing it with compression underwear because the underwear dug in to his thigh. Began bandaging LLE from foot to thigh using only 1 layer wrap at thigh to assess how pt tolerates it. Educated pt if he has any shortness of breath or discomfort to remove all bandages. Will see pt on Friday and make adjustments as needed.    PT Frequency 2x / week    PT Duration 4 weeks    PT Treatment/Interventions ADLs/Self Care Home Management;Therapeutic exercise;Therapeutic activities;Patient/family education;Manual techniques;Manual lymph drainage;Compression bandaging;Taping;Vasopneumatic Device    PT Next Visit Plan cont MLD to LLE and instruct pt and spouse, remeasure, see how pt tolerated bandaging, add artiflex at thigh next time, can add an additional layer to thigh if pt tolerated it ok    Consulted and Agree with Plan of Care Patient             Patient will benefit from skilled therapeutic intervention in order to improve the following deficits and impairments:  Pain, Increased edema, Decreased mobility  Visit Diagnosis: Lymphedema, not elsewhere classified  Pain in left leg     Problem List Patient Active Problem List   Diagnosis Date Noted   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

## 2021-03-04 ENCOUNTER — Ambulatory Visit: Payer: Medicare Other | Admitting: Physical Therapy

## 2021-03-04 ENCOUNTER — Other Ambulatory Visit: Payer: Self-pay

## 2021-03-04 DIAGNOSIS — M79605 Pain in left leg: Secondary | ICD-10-CM

## 2021-03-04 DIAGNOSIS — I890 Lymphedema, not elsewhere classified: Secondary | ICD-10-CM | POA: Diagnosis not present

## 2021-03-04 NOTE — Therapy (Signed)
[place], [place], [address] Phone: [phone]   Fax:  [phone]  Physical Therapy Treatment  Patient Details  Name: Joseph Werner MRN: [id number] Date of Birth: 1935/06/20 Referring Provider (PT): Shona Simpson   Encounter Date: 03/04/2021   PT End of Session - 03/04/21 1317     Visit Number 6    Number of Visits 25    Date for PT Re-Evaluation 05/05/21    PT Start Time 1000    PT Stop Time 1115    PT Time Calculation (min) 75 min    Activity Tolerance Patient tolerated treatment well    Behavior During Therapy [facility] for tasks assessed/performed             Past Medical History:  Diagnosis Date   Anemia    macrocytic anemia   Arthritis    Bradycardia    chronic   CAD (coronary artery disease)    Dyslipidemia    Eczema    Hyperlipidemia    Hypothyroid    Loss of weight    Macrocytosis    with normal bone marrow biopsy in 2006 may be benign varient, evaluated in the pastby hematologistDr. Benay Spice   Osteopenia    on bone density test from orthopedist Dr. Nelva Bush, bone density test in July 2010 showed T score-1.1 in the lumbar spine and 2.1 in the femur. Bone density in August 2012 showed T score 2.3 in the femur and 2.2 in the forearm   Postsurgical aortocoronary bypass status    Prostate cancer [facility])    H/O   Prostate cancer [facility]    followed by urology Dr. Diona Fanti    Past Surgical History:  Procedure Laterality Date   [place] Right 11/18/2012   Procedure: HERNIA REPAIR INGUINAL ADULT;  Surgeon: Merrie Roof, MD;  Location: [place];  Service: General;  Laterality: Right;   INSERTION OF MESH Right 11/18/2012   Procedure: INSERTION OF MESH;  Surgeon: Merrie Roof, MD;  Location: [place];  Service: General;  Laterality: Right;   PROSTATECTOMY      There were no  vitals filed for this visit.   Subjective Assessment - 03/04/21 1009     Subjective Pt comes in with his bandage still intact.  He says that he only has one layer of bandage on his thigh and thinks that all he will be able tolerate because he is still very sensitive in the one area.  He says that his right leg has been swelling up but it was back to normal when he woke up this morning.  After being up for a little while he notices swelling in his leg.    Pertinent History L thigh sarcoma, currently undergoing radiation, plan is to undergo surgery - 30 days later, heart bypass surgery, gallbladder removed, prostate cancer    Patient Stated Goals to wake up one morning without any pain and be able to walk across the room like nothing happened    Currently in Pain? Yes    Pain Score 2     Pain Location Other (Comment)   thigh   Pain Orientation Right;Lateral    Pain Descriptors / Indicators Tender    Pain Type Chronic pain    Pain Onset More than a month ago  [facility] PT Assessment - 03/04/21 0001       Assessment   Medical Diagnosis L thigh sarcoma    Referring Provider (PT) Shona Simpson    Onset Date/Surgical Date 11/26/20      Prior Function   Level of Independence Requires assistive device for independence               LYMPHEDEMA/ONCOLOGY QUESTIONNAIRE - 03/04/21 0001       Right Lower Extremity Lymphedema   30 cm Proximal to Suprapatella 55 cm    20 cm Proximal to Suprapatella 55 cm    10 cm Proximal to Suprapatella 49.2 cm    At Midpatella/Popliteal Crease 40.5 cm    30 cm Proximal to Floor at Lateral Plantar Foot 37.6 cm    20 cm Proximal to Floor at Lateral Plantar Foot 28.'5 1    10 ' cm Proximal to Floor at Lateral Malleoli 28.5 cm    5 cm Proximal to 1st MTP Joint 25 cm    Across MTP Joint 24.4 cm    Around Proximal Great Toe 8.4 cm      Left Lower Extremity Lymphedema   30 cm Proximal to Suprapatella 56 cm    20 cm Proximal to Suprapatella  65 cm    10 cm Proximal to Suprapatella 50 cm    At Midpatella/Popliteal Crease 39 cm    30 cm Proximal to Floor at Lateral Plantar Foot 35.5 cm    20 cm Proximal to Floor at Lateral Plantar Foot 30 cm    10 cm Proximal to Floor at Lateral Malleoli 28.5 cm    5 cm Proximal to 1st MTP Joint 28 cm    Across MTP Joint 24.5 cm    Around Proximal Great Toe 8.3 cm                        [facility] Adult PT Treatment/Exercise - 03/04/21 0001       Manual Therapy   Manual Therapy Edema management;Manual Lymphatic Drainage (MLD);Compression Bandaging    Manual therapy comments pt has 2 large bandaid on left lateral thigh that were removed and skin checked, top layer of skin came off nickel sized blister area with red tissue underneath no drainage or odor ( see photos)    Edema Management remeasured leg    Manual Lymphatic Drainage (MLD) in supine with HOB elevated: short neck, 5 diaphragmatic breaths, left axillary nodes, establishment of axillo inguinal pathway, left inguinal nodes, LLE working proximal to distal then retracing all steps    Compression Bandaging leg washed and dried TG soft size medium from foot to knee, TG soft size large from knee to thigh with allevyn placed over open blister area , artiflex from foot to knee (next time add from knee to thigh), elastomull to digits 1-3, 1 6 cm short stretch at foot, 1 8 cm at ankle, 1 12 cm from ankle to knee, then 2 12 cm from just below knee to groin                            PT Long Term Goals - 03/04/21 1322       PT LONG TERM GOAL #1   Title Pt and/or spouse will be independent in self MLD for long term management of lymphedema.    Time 8    Period Weeks    Status On-going  PT LONG TERM GOAL #2   Title Pt will obtain appropriate compression garments for long term management of lymphedema.    Time 8    Period Weeks    Status On-going      PT LONG TERM GOAL #3   Title Pt will demonstrate a 3 cm  decrease in circumference at 20 cm proximal to L suprapatella to decrease risk of infection.    Baseline 63 cm at baseline, 65 cm on 7/8.2022 ( measured over sarcoma site)    Time 8    Period Weeks    Status On-going      PT LONG TERM GOAL #4   Title Pt will report a 50% improvement in overall pain in LLE to allow improved comfort.    Baseline He says that the pain has basically gone away 03/04/2021    Status Achieved                   Plan - 03/04/21 1317     Clinical Impression Statement Pt has visible swelling in right foot and ankle today . He says he has been getting this. He says  both feet and ankles reduce to near normal size when elevated but swell up as soon as he puts them down to walk anywhere.  He did have some reduction in his left  leg with first session of compression bandaging.  He still has fullness and tenderness at area of sacrcoma with skin fragility present.  He was remeasured, received brief MLD and was rebandaged.  We still need to work on a type of compression that he and his wife will be able to manage at home so recert was sent for 8 more weeks to allow time to work that out and continue to work on reducing the lymphedema he has in both legs    Personal Factors and Comorbidities Age;Comorbidity 1    Comorbidities previous hx of prostate cancer    Examination-Activity Limitations Stand;Sit    Examination-Participation Restrictions Driving;Yard Work;Community Activity    Rehab Potential Good    PT Frequency 2x / week    PT Treatment/Interventions ADLs/Self Care Home Management;Therapeutic exercise;Therapeutic activities;Patient/family education;Manual techniques;Manual lymph drainage;Compression bandaging;Taping;Vasopneumatic Device    PT Next Visit Plan cont MLD to LLE and instruct pt and spouse, remeasure, see how pt tolerated bandaging, add artiflex at thigh next time, can add an additional layer to thigh if pt tolerated it ok start working on RLE lymphedema  and plan for compression at home    Consulted and Agree with Plan of Care Patient             Patient will benefit from skilled therapeutic intervention in order to improve the following deficits and impairments:     Visit Diagnosis: Lymphedema, not elsewhere classified - Plan: PT plan of care cert/re-cert  Pain in left leg - Plan: PT plan of care cert/re-cert     Problem List Patient Active Problem List   Diagnosis Date Noted   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 Owens Shark PT  [place] 03/04/2021, 1:31 PM  [place], [place], [address] Phone: [phone]   Fax:  [phone]  Name: RJ PEDROSA MRN: [id number] Date of Birth: [birth year]

## 2021-03-07 ENCOUNTER — Encounter: Payer: Self-pay | Admitting: Physical Therapy

## 2021-03-07 ENCOUNTER — Other Ambulatory Visit: Payer: Self-pay

## 2021-03-07 ENCOUNTER — Ambulatory Visit: Payer: Medicare Other | Admitting: Physical Therapy

## 2021-03-07 DIAGNOSIS — M79605 Pain in left leg: Secondary | ICD-10-CM

## 2021-03-07 DIAGNOSIS — I890 Lymphedema, not elsewhere classified: Secondary | ICD-10-CM

## 2021-03-07 NOTE — Therapy (Signed)
[place], [place], [address] Phone: [phone]   Fax:  [phone]  Physical Therapy Treatment  Patient Details  Name: Joseph Werner MRN: [id number] Date of Birth: 02-12-1935 Referring Provider (PT): Shona Simpson   Encounter Date: 03/07/2021   PT End of Session - 03/07/21 1214     Visit Number 7    Number of Visits 25    Date for PT Re-Evaluation 05/05/21    PT Start Time 1100    PT Stop Time 1208    PT Time Calculation (min) 68 min    Activity Tolerance Patient tolerated treatment well    Behavior During Therapy [facility] for tasks assessed/performed             Past Medical History:  Diagnosis Date   Anemia    macrocytic anemia   Arthritis    Bradycardia    chronic   CAD (coronary artery disease)    Dyslipidemia    Eczema    Hyperlipidemia    Hypothyroid    Loss of weight    Macrocytosis    with normal bone marrow biopsy in 2006 may be benign varient, evaluated in the pastby hematologistDr. Benay Spice   Osteopenia    on bone density test from orthopedist Dr. Nelva Bush, bone density test in July 2010 showed T score-1.1 in the lumbar spine and 2.1 in the femur. Bone density in August 2012 showed T score 2.3 in the femur and 2.2 in the forearm   Postsurgical aortocoronary bypass status    Prostate cancer [facility])    H/O   Prostate cancer [facility])    followed by urology Dr. Diona Fanti    Past Surgical History:  Procedure Laterality Date   [place] Right 11/18/2012   Procedure: HERNIA REPAIR INGUINAL ADULT;  Surgeon: Merrie Roof, MD;  Location: [place];  Service: General;  Laterality: Right;   INSERTION OF MESH Right 11/18/2012   Procedure: INSERTION OF MESH;  Surgeon: Merrie Roof, MD;  Location: [place];  Service: General;  Laterality: Right;   PROSTATECTOMY      There were no  vitals filed for this visit.   Subjective Assessment - 03/07/21 1213     Subjective These bandages slid down and did not a thing.    Pertinent History L thigh sarcoma, currently undergoing radiation, plan is to undergo surgery - 30 days later, heart bypass surgery, gallbladder removed, prostate cancer    Patient Stated Goals to wake up one morning without any pain and be able to walk across the room like nothing happened    Currently in Pain? --   pt did not rate pain today                              [facility] Adult PT Treatment/Exercise - 03/07/21 0001       Manual Therapy   Manual therapy comments removed 4 large bandages from posterior thigh which revealed peeling skin and a nickel size blister that was oozing- no odor. Rebandaged with 2 large pieces of Telfa with skin tape placed at corners    Edema Management discussed with pt whether he would like to purhcase a CircAid or a continue with bandaging - ultimately pt decided to continue bandaging- signficant  reduction noted today    Compression Bandaging leg washed and dried TG soft size medium from foot to knee, TG soft size large from knee to thigh with allevyn placed over open blister area , artiflex from foot to knee and knee to thigh), elastomull to digits 1-3, 1 6 cm short stretch at foot, 1 8 cm at ankle, 1 12 cm from ankle to knee, then 2 12 cm from just below knee to groin                         PT Long Term Goals - 03/04/21 1322       PT LONG TERM GOAL #1   Title Pt and/or spouse will be independent in self MLD for long term management of lymphedema.    Time 8    Period Weeks    Status On-going      PT LONG TERM GOAL #2   Title Pt will obtain appropriate compression garments for long term management of lymphedema.    Time 8    Period Weeks    Status On-going      PT LONG TERM GOAL #3   Title Pt will demonstrate a 3 cm decrease in circumference at 20 cm proximal to L suprapatella to  decrease risk of infection.    Baseline 63 cm at baseline, 65 cm on 7/8.2022 ( measured over sarcoma site)    Time 8    Period Weeks    Status On-going      PT LONG TERM GOAL #4   Title Pt will report a 50% improvement in overall pain in LLE to allow improved comfort.    Baseline He says that the pain has basically gone away 03/04/2021    Status Achieved                   Plan - 03/07/21 1215     Clinical Impression Statement Pt reports that the bandages did nothing and slid down immediately. Educated pt that they slid down due to decrease in swelling. Therapist removed bandaging and pt's leg had reduced greatly. Pt reported that was the best his leg had looked in a long time. Reapplied compression bandages while instructing pt and wife in correct donning technique. Pt and spouse educated to remove bandages that slide down and reapply with only very light tension. Pt would benefit from continue skilled PT services to continue to decrease swelling to improve mobility and pain.    PT Frequency 2x / week    PT Duration 4 weeks    PT Treatment/Interventions ADLs/Self Care Home Management;Therapeutic exercise;Therapeutic activities;Patient/family education;Manual techniques;Manual lymph drainage;Compression bandaging;Taping;Vasopneumatic Device    PT Next Visit Plan cont MLD to LLE and instruct pt and spouse, remeasure, see how pt tolerated bandaging, add artiflex at thigh next time, can add an additional layer to thigh if pt tolerated it ok start working on RLE lymphedema and plan for compression at home    Consulted and Agree with Plan of Care Patient             Patient will benefit from skilled therapeutic intervention in order to improve the following deficits and impairments:  Pain, Increased edema, Decreased mobility  Visit Diagnosis: Lymphedema, not elsewhere classified  Pain in left leg     Problem List Patient Active Problem List   Diagnosis Date Noted   Sarcoma  of left thigh ([place]) 01/05/2021   Need for follow-up by home health  service 06/08/2020   Atherosclerosis of native coronary artery of native heart without angina pectoris 02/24/2014   S/P CABG (coronary artery bypass graft) 02/24/2014   Bradycardia 02/24/2014   Right inguinal hernia 10/21/2012    Allyson Sabal [facility] 03/07/2021, 12:17 PM  [place], [place], [address] Phone: [phone]   Fax:  [phone]  Name: Joseph Werner MRN: [id number] Date of Birth: 1935-07-03   Manus Gunning, PT 03/07/21 12:17 PM

## 2021-03-09 ENCOUNTER — Ambulatory Visit: Payer: Medicare Other | Admitting: Physical Therapy

## 2021-03-09 ENCOUNTER — Other Ambulatory Visit: Payer: Self-pay

## 2021-03-09 ENCOUNTER — Encounter: Payer: Self-pay | Admitting: Physical Therapy

## 2021-03-09 DIAGNOSIS — M79605 Pain in left leg: Secondary | ICD-10-CM

## 2021-03-09 DIAGNOSIS — I890 Lymphedema, not elsewhere classified: Secondary | ICD-10-CM

## 2021-03-09 NOTE — Therapy (Signed)
[place], [place], [address] Phone: [phone]   Fax:  [phone]  Physical Therapy Treatment  Patient Details  Name: Joseph Werner MRN: [id number] Date of Birth: [birth year] Referring Provider (PT): Shona Simpson   Encounter Date: 03/09/2021   PT End of Session - 03/09/21 1200     Visit Number 8    Number of Visits 25    Date for PT Re-Evaluation 05/05/21    PT Start Time 1104    PT Stop Time 1205    PT Time Calculation (min) 61 min    Activity Tolerance Patient tolerated treatment well    Behavior During Therapy [facility] for tasks assessed/performed             Past Medical History:  Diagnosis Date   Anemia    macrocytic anemia   Arthritis    Bradycardia    chronic   CAD (coronary artery disease)    Dyslipidemia    Eczema    Hyperlipidemia    Hypothyroid    Loss of weight    Macrocytosis    with normal bone marrow biopsy in 2006 may be benign varient, evaluated in the pastby hematologistDr. Benay Spice   Osteopenia    on bone density test from orthopedist Dr. Nelva Bush, bone density test in July 2010 showed T score-1.1 in the lumbar spine and 2.1 in the femur. Bone density in August 2012 showed T score 2.3 in the femur and 2.2 in the forearm   Postsurgical aortocoronary bypass status    Prostate cancer [facility])    H/O   Prostate cancer [facility])    followed by urology Dr. Diona Fanti    Past Surgical History:  Procedure Laterality Date   [place] Right 11/18/2012   Procedure: HERNIA REPAIR INGUINAL ADULT;  Surgeon: Merrie Roof, MD;  Location: [place];  Service: General;  Laterality: Right;   INSERTION OF MESH Right 11/18/2012   Procedure: INSERTION OF MESH;  Surgeon: Merrie Roof, MD;  Location: [place];  Service: General;  Laterality: Right;   PROSTATECTOMY      There were no  vitals filed for this visit.   Subjective Assessment - 03/09/21 1105     Subjective The bandages slid down again and I tried to rewrap it. I got it back up.    Pertinent History L thigh sarcoma, currently undergoing radiation, plan is to undergo surgery - 30 days later, heart bypass surgery, gallbladder removed, prostate cancer    Patient Stated Goals to wake up one morning without any pain and be able to walk across the room like nothing happened    Currently in Pain? No/denies    Pain Score 0-No pain                               [facility] Adult PT Treatment/Exercise - 03/09/21 0001       Manual Therapy   Manual therapy comments removed telfa pad and 2 bandaids revealing 2 healing blisters and 1 intact blister - replaced with 3 telfa pads and micropore surgical tape    Manual Lymphatic Drainage (MLD) in supine with HOB elevated: short neck, 5 diaphragmatic breaths, left axillary nodes, establishment of axillo inguinal pathway, left inguinal nodes, LLE working proximal to  distal then retracing all steps    Compression Bandaging leg washed and dried TG soft size medium from foot to knee, TG soft size large from knee to thigh with allevyn placed over open blister area , artiflex from foot to knee and rosidal from knee to thigh, elastomull to digits 1-3, 1 6 cm short stretch at foot, 1 8 cm at ankle, 1 12 cm from ankle to knee, then 3 12 cm from just below knee to groin                         PT Long Term Goals - 03/04/21 1322       PT LONG TERM GOAL #1   Title Pt and/or spouse will be independent in self MLD for long term management of lymphedema.    Time 8    Period Weeks    Status On-going      PT LONG TERM GOAL #2   Title Pt will obtain appropriate compression garments for long term management of lymphedema.    Time 8    Period Weeks    Status On-going      PT LONG TERM GOAL #3   Title Pt will demonstrate a 3 cm decrease in circumference at 20  cm proximal to L suprapatella to decrease risk of infection.    Baseline 63 cm at baseline, 65 cm on 7/8.2022 ( measured over sarcoma site)    Time 8    Period Weeks    Status On-going      PT LONG TERM GOAL #4   Title Pt will report a 50% improvement in overall pain in LLE to allow improved comfort.    Baseline He says that the pain has basically gone away 03/04/2021    Status Achieved                   Plan - 03/09/21 1200     Clinical Impression Statement Pt reports the top of the bandages slid down but the bottom stayed in place better. Pt has had significant reduction in LLE lymphedema with increased skin mobility. He has a new blister that is still intact. Added rosidal to upper thigh to help reduce any friction after covering all open areas with a telfa pad and micro pore tape. Continued with MLD to LLE and reapplied compression bandaging.    PT Frequency 3x / week    PT Duration 4 weeks    PT Treatment/Interventions ADLs/Self Care Home Management;Therapeutic exercise;Therapeutic activities;Patient/family education;Manual techniques;Manual lymph drainage;Compression bandaging;Taping;Vasopneumatic Device    PT Next Visit Plan cont MLD and bandaging to LLE and instruct pt and spouse, remeasure,    Consulted and Agree with Plan of Care Patient             Patient will benefit from skilled therapeutic intervention in order to improve the following deficits and impairments:  Pain, Increased edema, Decreased mobility  Visit Diagnosis: Lymphedema, not elsewhere classified  Pain in left leg     Problem List Patient Active Problem List   Diagnosis Date Noted   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

## 2021-03-09 NOTE — Addendum Note (Signed)
Addended by: Wynelle Beckmann, Hilaria Ota L on: 03/09/2021 12:12 PM   Modules accepted: Orders

## 2021-03-10 ENCOUNTER — Encounter: Payer: Medicare Other | Admitting: Physical Therapy

## 2021-03-11 ENCOUNTER — Other Ambulatory Visit: Payer: Self-pay

## 2021-03-11 ENCOUNTER — Encounter: Payer: Self-pay | Admitting: Physical Therapy

## 2021-03-11 ENCOUNTER — Ambulatory Visit: Payer: Medicare Other | Admitting: Physical Therapy

## 2021-03-11 DIAGNOSIS — I890 Lymphedema, not elsewhere classified: Secondary | ICD-10-CM

## 2021-03-11 NOTE — Therapy (Signed)
[place], [place], [address] Phone: [phone]   Fax:  [phone]  Physical Therapy Treatment  Patient Details  Name: Joseph Werner MRN: [id number] Date of Birth: 07/22/1935 Referring Provider (PT): Shona Simpson   Encounter Date: 03/11/2021   PT End of Session - 03/11/21 1135     Visit Number 9    Number of Visits 25    Date for PT Re-Evaluation 05/05/21    PT Start Time 0900    PT Stop Time 1010    PT Time Calculation (min) 70 min    Activity Tolerance Patient tolerated treatment well    Behavior During Therapy [facility] for tasks assessed/performed             Past Medical History:  Diagnosis Date   Anemia    macrocytic anemia   Arthritis    Bradycardia    chronic   CAD (coronary artery disease)    Dyslipidemia    Eczema    Hyperlipidemia    Hypothyroid    Loss of weight    Macrocytosis    with normal bone marrow biopsy in 2006 may be benign varient, evaluated in the pastby hematologistDr. Benay Spice   Osteopenia    on bone density test from orthopedist Dr. Nelva Bush, bone density test in July 2010 showed T score-1.1 in the lumbar spine and 2.1 in the femur. Bone density in August 2012 showed T score 2.3 in the femur and 2.2 in the forearm   Postsurgical aortocoronary bypass status    Prostate cancer [facility])    H/O   Prostate cancer [facility])    followed by urology Dr. Diona Fanti    Past Surgical History:  Procedure Laterality Date   [place] Right 11/18/2012   Procedure: HERNIA REPAIR INGUINAL ADULT;  Surgeon: Merrie Roof, MD;  Location: [place];  Service: General;  Laterality: Right;   INSERTION OF MESH Right 11/18/2012   Procedure: INSERTION OF MESH;  Surgeon: Merrie Roof, MD;  Location: [place];  Service: General;  Laterality: Right;   PROSTATECTOMY      There were no  vitals filed for this visit.   Subjective Assessment - 03/11/21 0929     Subjective Pt is happy with his reduction in his leg. He does not has much wrapping on his thigh as it makes it difficult to move around in the  bed. He had a fall last week and the only pain he has from his butt sitting from that and lying on the hard surface    Pertinent History L thigh sarcoma, currently undergoing radiation, plan is to undergo surgery - 30 days later, heart bypass surgery, gallbladder removed, prostate cancer    Patient Stated Goals to wake up one morning without any pain and be able to walk across the room like nothing happened    Currently in Pain? Yes    Pain Score 5     Pain Location Buttocks    Pain Orientation Right;Left;Medial    Pain Descriptors / Indicators Sore    Pain Type Acute pain    Pain Onset In the past 7 days    Pain Frequency Intermittent    Aggravating Factors  lying on back    Pain Relieving Factors standing up  LYMPHEDEMA/ONCOLOGY QUESTIONNAIRE - 03/11/21 0001       Left Lower Extremity Lymphedema   30 cm Proximal to Suprapatella 54 cm    20 cm Proximal to Suprapatella 56.5 cm    10 cm Proximal to Suprapatella 48 cm    At Midpatella/Popliteal Crease 39 cm    30 cm Proximal to Floor at Lateral Plantar Foot 32 cm    20 cm Proximal to Floor at Lateral Plantar Foot 26.3 cm    10 cm Proximal to Floor at Lateral Malleoli 24.5 cm    5 cm Proximal to 1st MTP Joint 26 cm    Across MTP Joint 23 cm    Around Proximal Great Toe 7.9 cm                        [facility] Adult PT Treatment/Exercise - 03/11/21 0001       Manual Therapy   Manual Therapy Edema management;Manual Lymphatic Drainage (MLD);Compression Bandaging    Manual therapy comments removed telfa revealing 2 healing blisters and 1 intact blister - replaced with 1 telfa pads and micropore surgical tape    Edema Management remeasured leg    Manual Lymphatic Drainage (MLD) in  supine with HOB elevated: short neck, 5 diaphragmatic breaths, left axillary nodes, establishment of axillo inguinal pathway, left inguinal nodes, LLE working proximal to distal then retracing all steps    Compression Bandaging leg washed and dried TG soft size medium from foot to knee, TG soft size large from knee to thigh with telfa placed over open blister area , artiflex from foot to thigh, elastomull to digits 1-3, 1 6 cm short stretch at foot, 1 8 cm at ankle, 1 12 cm from ankle to knee, then 3 12 cm from just below knee to groin                         PT Long Term Goals - 03/04/21 1322       PT LONG TERM GOAL #1   Title Pt and/or spouse will be independent in self MLD for long term management of lymphedema.    Time 8    Period Weeks    Status On-going      PT LONG TERM GOAL #2   Title Pt will obtain appropriate compression garments for long term management of lymphedema.    Time 8    Period Weeks    Status On-going      PT LONG TERM GOAL #3   Title Pt will demonstrate a 3 cm decrease in circumference at 20 cm proximal to L suprapatella to decrease risk of infection.    Baseline 63 cm at baseline, 65 cm on 7/8.2022 ( measured over sarcoma site)    Time 8    Period Weeks    Status On-going      PT LONG TERM GOAL #4   Title Pt will report a 50% improvement in overall pain in LLE to allow improved comfort.    Baseline He says that the pain has basically gone away 03/04/2021    Status Achieved                   Plan - 03/11/21 1135     Clinical Impression Statement Pt has significant reduction of lymphedema in his left leg but still has some in the ball of his foot that is resistant to reduction and difficult to manage with compression.  He wants to keep doing the bandaging until after surgery for now.  He knows he needs to do something about the swelling in his right leg at some point.    Personal Factors and Comorbidities Age;Comorbidity 1     Comorbidities previous hx of prostate cancer    Examination-Activity Limitations Stand;Sit    Examination-Participation Restrictions Driving;Yard Work;Community Activity    Stability/Clinical Decision Making Stable/Uncomplicated    Rehab Potential Good    PT Frequency 3x / week    PT Duration 4 weeks    PT Treatment/Interventions ADLs/Self Care Home Management;Therapeutic exercise;Therapeutic activities;Patient/family education;Manual techniques;Manual lymph drainage;Compression bandaging;Taping;Vasopneumatic Device    PT Next Visit Plan cont MLD and bandaging to LLE and instruct pt and spouse, remeasure,    Consulted and Agree with Plan of Care Patient             Patient will benefit from skilled therapeutic intervention in order to improve the following deficits and impairments:  Pain, Increased edema, Decreased mobility  Visit Diagnosis: Lymphedema, not elsewhere classified     Problem List Patient Active Problem List   Diagnosis Date Noted   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 Owens Shark PT  [place] 03/11/2021, 11:38 AM  [place], [place], [address] Phone: [phone]   Fax:  [phone]  Name: Joseph Werner MRN: [id number] Date of Birth: Jul 31, 1935

## 2021-03-14 ENCOUNTER — Ambulatory Visit: Payer: Medicare Other | Admitting: Physical Therapy

## 2021-03-14 ENCOUNTER — Encounter: Payer: Self-pay | Admitting: Physical Therapy

## 2021-03-14 ENCOUNTER — Other Ambulatory Visit: Payer: Self-pay

## 2021-03-14 DIAGNOSIS — I890 Lymphedema, not elsewhere classified: Secondary | ICD-10-CM

## 2021-03-14 DIAGNOSIS — M79605 Pain in left leg: Secondary | ICD-10-CM

## 2021-03-14 NOTE — Therapy (Signed)
[place], [place], [address] Phone: [phone]   Fax:  [phone]  Physical Therapy Treatment  Patient Details  Name: Joseph Werner MRN: [id number] Date of Birth: 1934-11-18 Referring Provider (PT): Shona Simpson   Encounter Date: 03/14/2021   PT End of Session - 03/14/21 1207     Visit Number 10    Number of Visits 25    Date for PT Re-Evaluation 05/05/21    PT Start Time 1102    PT Stop Time 1203    PT Time Calculation (min) 61 min    Activity Tolerance Patient tolerated treatment well    Behavior During Therapy [facility] for tasks assessed/performed             Past Medical History:  Diagnosis Date   Anemia    macrocytic anemia   Arthritis    Bradycardia    chronic   CAD (coronary artery disease)    Dyslipidemia    Eczema    Hyperlipidemia    Hypothyroid    Loss of weight    Macrocytosis    with normal bone marrow biopsy in 2006 may be benign varient, evaluated in the pastby hematologistDr. Benay Spice   Osteopenia    on bone density test from orthopedist Dr. Nelva Bush, bone density test in July 2010 showed T score-1.1 in the lumbar spine and 2.1 in the femur. Bone density in August 2012 showed T score 2.3 in the femur and 2.2 in the forearm   Postsurgical aortocoronary bypass status    Prostate cancer [facility])    H/O   Prostate cancer [facility])    followed by urology Dr. Diona Fanti    Past Surgical History:  Procedure Laterality Date   [place] Right 11/18/2012   Procedure: HERNIA REPAIR INGUINAL ADULT;  Surgeon: Merrie Roof, MD;  Location: [place];  Service: General;  Laterality: Right;   INSERTION OF MESH Right 11/18/2012   Procedure: INSERTION OF MESH;  Surgeon: Merrie Roof, MD;  Location: [place];  Service: General;  Laterality: Right;   PROSTATECTOMY      There were  no vitals filed for this visit.   Subjective Assessment - 03/14/21 1102     Subjective I told her last time we needed to keep the wraps on the thigh down because it was too much with the foam so she did not use it.    Pertinent History L thigh sarcoma, currently undergoing radiation, plan is to undergo surgery - 30 days later, heart bypass surgery, gallbladder removed, prostate cancer    Patient Stated Goals to wake up one morning without any pain and be able to walk across the room like nothing happened    Currently in Pain? Yes    Pain Score 5     Pain Location Buttocks    Pain Orientation Right;Left;Medial    Pain Descriptors / Indicators Sore    Pain Type Acute pain    Pain Onset In the past 7 days    Pain Frequency Intermittent    Aggravating Factors  lying on back    Pain Relieving Factors standing up    Effect of Pain on Daily Activities painful to stand up and stand up from chair  [place] Adult PT Treatment/Exercise - 03/14/21 0001       Manual Therapy   Manual therapy comments removed telfa revealing 2 healing blisters and 1 intact blister - replaced with 1 telfa pad and micropore surgical tape    Edema Management educated pt in correct technique for self MLD for scrotal edema    Manual Lymphatic Drainage (MLD) in supine with HOB elevated: short neck, 5 diaphragmatic breaths, left axillary nodes, establishment of axillo inguinal pathway, left inguinal nodes, LLE working proximal to distal then retracing all steps    Compression Bandaging leg washed, lotion to entire leg, tg soft from foot to knee, telfa placed over open blister area , artiflex from foot to knee, elastomull to digits 1-3, 1 6 cm short stretch at foot, 1 8 cm at ankle, 1 12 cm from ankle to knee                         PT Long Term Goals - 03/04/21 1322       PT LONG TERM GOAL #1   Title Pt and/or spouse will be independent in self MLD for long  term management of lymphedema.    Time 8    Period Weeks    Status On-going      PT LONG TERM GOAL #2   Title Pt will obtain appropriate compression garments for long term management of lymphedema.    Time 8    Period Weeks    Status On-going      PT LONG TERM GOAL #3   Title Pt will demonstrate a 3 cm decrease in circumference at 20 cm proximal to L suprapatella to decrease risk of infection.    Baseline 63 cm at baseline, 65 cm on 7/8.2022 ( measured over sarcoma site)    Time 8    Period Weeks    Status On-going      PT LONG TERM GOAL #4   Title Pt will report a 50% improvement in overall pain in LLE to allow improved comfort.    Baseline He says that the pain has basically gone away 03/04/2021    Status Achieved                   Plan - 03/14/21 1207     Clinical Impression Statement Pt continues to demonstrate significant reduction of LLE. Decided today not to bandage L upper thigh to allow fragile skin in radiated area time to heal. Pt has had several blisters in this area so did not apply bandages in case the friction from the bandages was causing the skin to open. Educated pt to keep this area moisturized. Also educated pt in technique for genital self MLD since pt reports his continues to experience scrotal edema that does not resolve. Pt verbalized understanding. Pt came to appt with compression stocking on RLE and it appeared reduced today so educated pt to bring in other stocking to try on LLE.    PT Frequency 3x / week    PT Duration 4 weeks    PT Treatment/Interventions ADLs/Self Care Home Management;Therapeutic exercise;Therapeutic activities;Patient/family education;Manual techniques;Manual lymph drainage;Compression bandaging;Taping;Vasopneumatic Device    PT Next Visit Plan cont MLD and bandaging to LLE and instruct pt and spouse, see if thigh increased and rebandage if needed, assess skin, see if compression garment fits LLE, remeasure,    Consulted and Agree  with Plan of Care Patient  Patient will benefit from skilled therapeutic intervention in order to improve the following deficits and impairments:     Visit Diagnosis: Lymphedema, not elsewhere classified  Pain in left leg     Problem List Patient Active Problem List   Diagnosis Date Noted   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

## 2021-03-16 ENCOUNTER — Ambulatory Visit: Payer: Medicare Other | Admitting: Physical Therapy

## 2021-03-16 ENCOUNTER — Other Ambulatory Visit: Payer: Self-pay | Admitting: Orthopedic Surgery

## 2021-03-16 ENCOUNTER — Other Ambulatory Visit (HOSPITAL_COMMUNITY): Payer: Self-pay | Admitting: Orthopedic Surgery

## 2021-03-16 ENCOUNTER — Encounter: Payer: Self-pay | Admitting: Physical Therapy

## 2021-03-16 ENCOUNTER — Other Ambulatory Visit: Payer: Self-pay

## 2021-03-16 DIAGNOSIS — I890 Lymphedema, not elsewhere classified: Secondary | ICD-10-CM | POA: Diagnosis not present

## 2021-03-16 DIAGNOSIS — M79605 Pain in left leg: Secondary | ICD-10-CM

## 2021-03-16 DIAGNOSIS — C499 Malignant neoplasm of connective and soft tissue, unspecified: Secondary | ICD-10-CM

## 2021-03-16 NOTE — Therapy (Signed)
[place], [place], [address] Phone: [phone]   Fax:  [phone]  Physical Therapy Treatment  Patient Details  Name: Joseph Werner MRN: [id number] Date of Birth: 22-Jun-1935 Referring Provider (PT): Shona Simpson   Encounter Date: 03/16/2021   PT End of Session - 03/16/21 1204     Visit Number 11    Number of Visits 25    Date for PT Re-Evaluation 05/05/21    PT Start Time 1103    PT Stop Time 1202    PT Time Calculation (min) 59 min    Activity Tolerance Patient tolerated treatment well    Behavior During Therapy [facility] for tasks assessed/performed             Past Medical History:  Diagnosis Date   Anemia    macrocytic anemia   Arthritis    Bradycardia    chronic   CAD (coronary artery disease)    Dyslipidemia    Eczema    Hyperlipidemia    Hypothyroid    Loss of weight    Macrocytosis    with normal bone marrow biopsy in 2006 may be benign varient, evaluated in the pastby hematologistDr. Benay Spice   Osteopenia    on bone density test from orthopedist Dr. Nelva Bush, bone density test in July 2010 showed T score-1.1 in the lumbar spine and 2.1 in the femur. Bone density in August 2012 showed T score 2.3 in the femur and 2.2 in the forearm   Postsurgical aortocoronary bypass status    Prostate cancer [facility])    H/O   Prostate cancer [facility])    followed by urology Dr. Diona Fanti    Past Surgical History:  Procedure Laterality Date   [place] Right 11/18/2012   Procedure: HERNIA REPAIR INGUINAL ADULT;  Surgeon: Merrie Roof, MD;  Location: [place];  Service: General;  Laterality: Right;   INSERTION OF MESH Right 11/18/2012   Procedure: INSERTION OF MESH;  Surgeon: Merrie Roof, MD;  Location: [place];  Service: General;  Laterality: Right;   PROSTATECTOMY      There were  no vitals filed for this visit.   Subjective Assessment - 03/16/21 1104     Subjective I have an appointment to get an MRI on the 3rd of August and we meet with the doctor at that time. The surgery will probably be 2-3 weeks after that.    Pertinent History L thigh sarcoma, currently undergoing radiation, plan is to undergo surgery - 30 days later, heart bypass surgery, gallbladder removed, prostate cancer    Patient Stated Goals to wake up one morning without any pain and be able to walk across the room like nothing happened    Currently in Pain? No/denies    Pain Score 0-No pain                               [facility] Adult PT Treatment/Exercise - 03/16/21 0001       Manual Therapy   Manual therapy comments removed telfa and washed leg with soap and water then applied lotion and did not reapply telfa    Edema Management donned pt's compression stocking that he had prior to bandaging and it fit ok (lower leg is large but  upper leg fit)- educated pt about donning gloves, decided pt should not wear it at this time due to skin still healing in radiated area- educated pt and spouse to continue to mositurize this area    Manual Lymphatic Drainage (MLD) in supine with HOB elevated: short neck, 5 diaphragmatic breaths, left axillary nodes, establishment of axillo inguinal pathway, left inguinal nodes, LLE working proximal to distal then retracing all steps    Compression Bandaging leg washed, lotion to entire leg, tg soft from foot to knee, telfa placed over open blister area , artiflex from foot to knee, elastomull to digits 1-3, 1 6 cm short stretch at foot, 1 8 cm at ankle, 1 12 cm from ankle to knee                         PT Long Term Goals - 03/04/21 1322       PT LONG TERM GOAL #1   Title Pt and/or spouse will be independent in self MLD for long term management of lymphedema.    Time 8    Period Weeks    Status On-going      PT LONG TERM GOAL #2    Title Pt will obtain appropriate compression garments for long term management of lymphedema.    Time 8    Period Weeks    Status On-going      PT LONG TERM GOAL #3   Title Pt will demonstrate a 3 cm decrease in circumference at 20 cm proximal to L suprapatella to decrease risk of infection.    Baseline 63 cm at baseline, 65 cm on 7/8.2022 ( measured over sarcoma site)    Time 8    Period Weeks    Status On-going      PT LONG TERM GOAL #4   Title Pt will report a 50% improvement in overall pain in LLE to allow improved comfort.    Baseline He says that the pain has basically gone away 03/04/2021    Status Achieved                   Plan - 03/16/21 1204     Clinical Impression Statement Pt did well with bandaging to knee last session. His upper thigh demosntrates some increase in edema but his skin is looking much better. Decided to continue with bandaging to knee to allow skin to continue to heal at posterior thigh. Donned pt's current compression stocking that is a thigh high to assess for fit and it fit ok - was a little large at lower leg but did fit his thigh. Pt should not be measured for a custom stocking until after his next surgery since it will no longer fit after he has surgery. Due to peeling skin at posterior thigh decided against pt wearing stocking and therapist applied compression bandaging to knee. Educated pt that since he does not have an appointment for bandaging on Friday he can remove bandages and apply compression stocking as long as his skin is no longer peeling.    PT Frequency 3x / week    PT Duration 4 weeks    PT Treatment/Interventions ADLs/Self Care Home Management;Therapeutic exercise;Therapeutic activities;Patient/family education;Manual techniques;Manual lymph drainage;Compression bandaging;Taping;Vasopneumatic Device    PT Next Visit Plan cont MLD and bandaging to LLE and instruct pt and spouse, see if thigh increased and rebandage if needed, assess  skin, see if compression garment fits LLE, pt can try to wear  compression stocking if skin on posterior thigh looks good    Consulted and Agree with Plan of Care Patient             Patient will benefit from skilled therapeutic intervention in order to improve the following deficits and impairments:  Pain, Increased edema, Decreased mobility  Visit Diagnosis: Lymphedema, not elsewhere classified  Pain in left leg     Problem List Patient Active Problem List   Diagnosis Date Noted   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

## 2021-03-21 ENCOUNTER — Other Ambulatory Visit: Payer: Self-pay

## 2021-03-21 ENCOUNTER — Ambulatory Visit: Payer: Medicare Other | Admitting: Rehabilitation

## 2021-03-21 DIAGNOSIS — I890 Lymphedema, not elsewhere classified: Secondary | ICD-10-CM

## 2021-03-21 DIAGNOSIS — M79605 Pain in left leg: Secondary | ICD-10-CM

## 2021-03-21 NOTE — Therapy (Signed)
[employer] [place] [place], [place], [address] Phone: [phone]   Fax:  [phone]  Physical Therapy Treatment  Patient Details  Name: Joseph Werner MRN: [id number] Date of Birth: [birth year] Referring Provider (PT): Shona Simpson   Encounter Date: 03/21/2021   PT End of Session - 03/21/21 1303     Visit Number 12    Number of Visits 25    Date for PT Re-Evaluation 05/05/21    PT Start Time 1100    PT Stop Time 4825    PT Time Calculation (min) 56 min    Activity Tolerance Patient tolerated treatment well    Behavior During Therapy [facility] for tasks assessed/performed             Past Medical History:  Diagnosis Date   Anemia    macrocytic anemia   Arthritis    Bradycardia    chronic   CAD (coronary artery disease)    Dyslipidemia    Eczema    Hyperlipidemia    Hypothyroid    Loss of weight    Macrocytosis    with normal bone marrow biopsy in 2006 may be benign varient, evaluated in the pastby hematologistDr. Benay Spice   Osteopenia    on bone density test from orthopedist Dr. Nelva Bush, bone density test in July 2010 showed T score-1.1 in the lumbar spine and 2.1 in the femur. Bone density in August 2012 showed T score 2.3 in the femur and 2.2 in the forearm   Postsurgical aortocoronary bypass status    Prostate cancer [facility])    H/O   Prostate cancer [facility])    followed by urology Dr. Diona Fanti    Past Surgical History:  Procedure Laterality Date   [place] Right 11/18/2012   Procedure: HERNIA REPAIR INGUINAL ADULT;  Surgeon: Merrie Roof, MD;  Location: [place];  Service: General;  Laterality: Right;   INSERTION OF MESH Right 11/18/2012   Procedure: INSERTION OF MESH;  Surgeon: Merrie Roof, MD;  Location: [place];  Service: General;  Laterality: Right;   PROSTATECTOMY      There were  no vitals filed for this visit.   Subjective Assessment - 03/21/21 1104     Subjective Nothing new    Pertinent History L thigh sarcoma, currently undergoing radiation, plan is to undergo surgery - 30 days later, heart bypass surgery, gallbladder removed, prostate cancer    Currently in Pain? No/denies                               [facility] Adult PT Treatment/Exercise - 03/21/21 0001       Manual Therapy   Manual Lymphatic Drainage (MLD) in supine with HOB elevated: short neck, 5 diaphragmatic breaths, left axillary nodes, establishment of axillo inguinal pathway, left inguinal nodes, LLE working proximal to distal then retracing all steps. Included more abdominal work today as pt reports recent weight gain in the area. Taught him resisted diaphragmatic breathing    Compression Bandaging leg washed, lotion to entire leg, tg soft from foot to knee, artiflex from foot to knee, elastomull to digits 1-3, 1 6 cm short stretch at foot, 1 8 cm at ankle, 1 12 cm from ankle to knee  PT Long Term Goals - 03/04/21 1322       PT LONG TERM GOAL #1   Title Pt and/or spouse will be independent in self MLD for long term management of lymphedema.    Time 8    Period Weeks    Status On-going      PT LONG TERM GOAL #2   Title Pt will obtain appropriate compression garments for long term management of lymphedema.    Time 8    Period Weeks    Status On-going      PT LONG TERM GOAL #3   Title Pt will demonstrate a 3 cm decrease in circumference at 20 cm proximal to L suprapatella to decrease risk of infection.    Baseline 63 cm at baseline, 65 cm on 7/8.2022 ( measured over sarcoma site)    Time 8    Period Weeks    Status On-going      PT LONG TERM GOAL #4   Title Pt will report a 50% improvement in overall pain in LLE to allow improved comfort.    Baseline He says that the pain has basically gone away 03/04/2021    Status Achieved                    Plan - 03/21/21 1304     Clinical Impression Statement Pt would most likely be able to tolerate thigh bandaging soon will consider this in upcoming sessions. Asked pt some questions about the abdomen and he said he does seem like he is having some edema here moving to the opposite LE so we included more abdominals today during MLD and discussed breathing at home.  Continued bandaging.  Pt has an MRI on Wed before PT so will call if it is getting too late    PT Frequency 3x / week    PT Duration 4 weeks    PT Treatment/Interventions ADLs/Self Care Home Management;Therapeutic exercise;Therapeutic activities;Patient/family education;Manual techniques;Manual lymph drainage;Compression bandaging;Taping;Vasopneumatic Device    PT Next Visit Plan cont MLD and bandaging to LLE and instruct pt and spouse, see if thigh increased and rebandage if needed, assess skin, see if compression garment fits LLE, pt can try to wear compression stocking if skin on posterior thigh looks good    Consulted and Agree with Plan of Care Patient             Patient will benefit from skilled therapeutic intervention in order to improve the following deficits and impairments:     Visit Diagnosis: Lymphedema, not elsewhere classified  Pain in left leg     Problem List Patient Active Problem List   Diagnosis Date Noted   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

## 2021-03-23 ENCOUNTER — Ambulatory Visit (HOSPITAL_COMMUNITY)
Admission: RE | Admit: 2021-03-23 | Discharge: 2021-03-23 | Disposition: A | Discharge status: Home / Self Care | Payer: Medicare Other | Source: Ambulatory Visit | Attending: Orthopedic Surgery | Admitting: Orthopedic Surgery

## 2021-03-23 ENCOUNTER — Encounter: Payer: Self-pay | Admitting: Rehabilitation

## 2021-03-23 ENCOUNTER — Ambulatory Visit: Payer: Medicare Other | Admitting: Rehabilitation

## 2021-03-23 ENCOUNTER — Other Ambulatory Visit: Payer: Self-pay

## 2021-03-23 DIAGNOSIS — C499 Malignant neoplasm of connective and soft tissue, unspecified: Secondary | ICD-10-CM | POA: Diagnosis not present

## 2021-03-23 DIAGNOSIS — I890 Lymphedema, not elsewhere classified: Secondary | ICD-10-CM

## 2021-03-23 DIAGNOSIS — M79605 Pain in left leg: Secondary | ICD-10-CM

## 2021-03-23 IMAGING — MR MR FEMUR*L* WO/W CM
7 of 8 series · 35 of 40 positions shown · IV contrast (gadavist)
Comparison: [DATE]

CLINICAL DATA: History of prostate cancer. Left thigh mass
follow-up.

EXAM:
MR OF THE LEFT LOWER EXTREMITY WITHOUT AND WITH CONTRAST
TECHNIQUE: Multiplanar, multisequence MR imaging of the left thigh was
performed both before and after administration of intravenous
contrast.
CONTRAST:  8mL GADAVIST GADOBUTROL 1 MMOL/ML IV SOLN

[Series 3: T1 · coronal · left · 4.0mm · 1.95mm/px · 4 of 40 slices shown (1 of 2)]
[im 1/40]
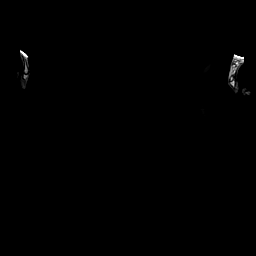
[im 14/40]
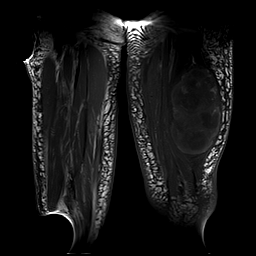
[im 27/40]
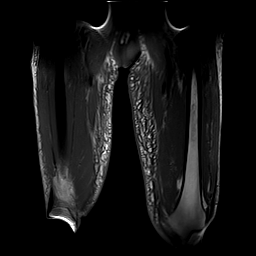
[im 40/40]
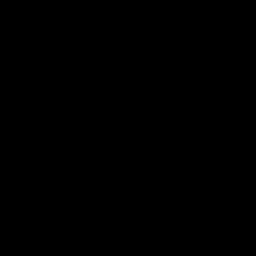

[Series 4: STIR · coronal · left · 4.0mm · 1.95mm/px · 4 of 40 slices shown (1 of 2)]
[im 1/40]
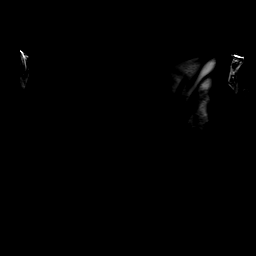
[im 14/40]
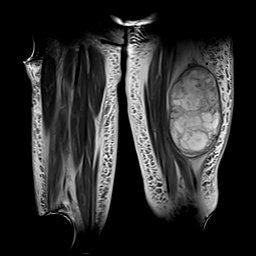
[im 27/40]
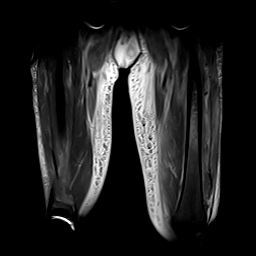
[im 40/40]
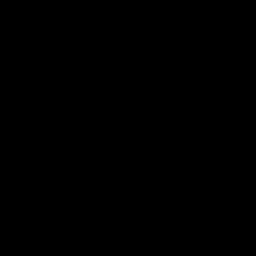

[Series 5: T1 · axial · left · 5.0mm · 1.02mm/px · z∈[-150,+253]mm · 6 of 66 slices shown (2 of 2)]
[im 1/66]
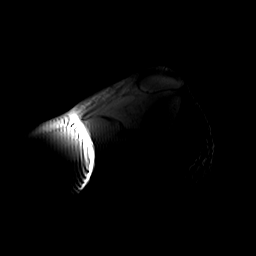
[im 14/66]
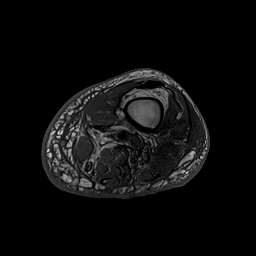
[im 27/66]
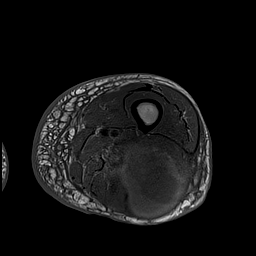
[im 40/66]
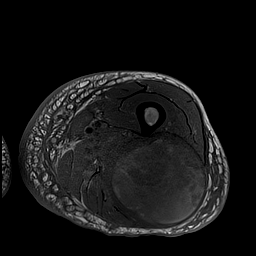
[im 53/66]
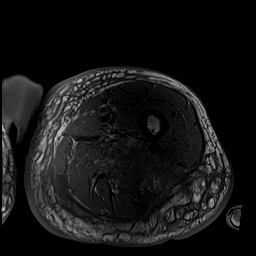
[im 66/66]
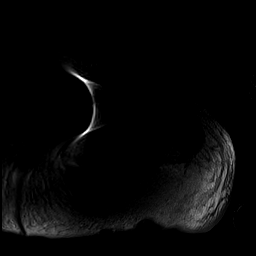

[Series 6: T2 fat-sat · axial · left · 5.0mm · 1.02mm/px · z∈[-150,+253]mm · 6 of 66 slices shown]
[im 1/66]
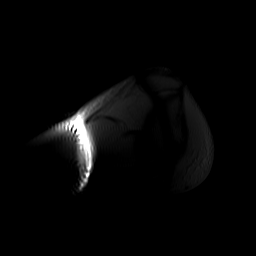
[im 14/66]
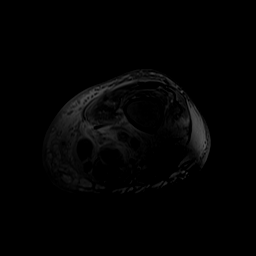
[im 27/66]
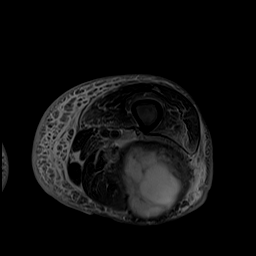
[im 40/66]
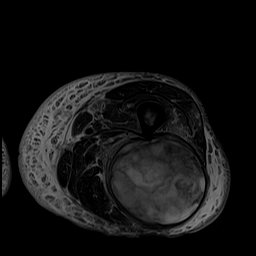
[im 53/66]
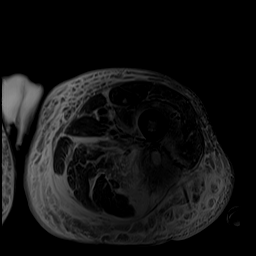
[im 66/66]
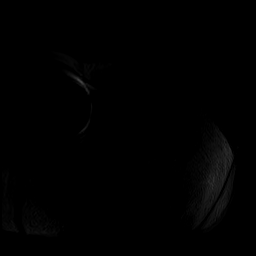

[Series 7: STIR · sagittal · left · 4.0mm · 1.13mm/px · 4 of 42 slices shown (2 of 2)]
[im 1/42]
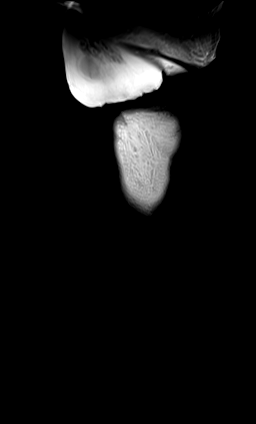
[im 14/42]
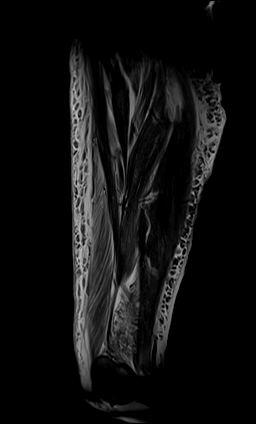
[im 28/42]
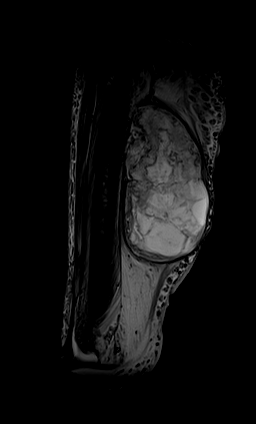
[im 42/42]
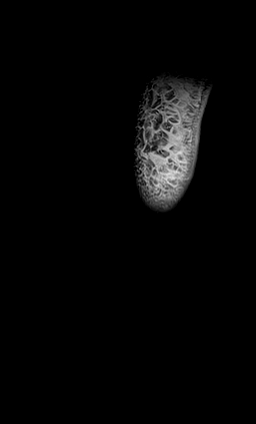

[Series 8: pre axial ti · axial · non-contrast · left · 5.0mm · 0.51mm/px · z∈[-150,+253]mm · 6 of 66 slices shown]
[im 1/66]
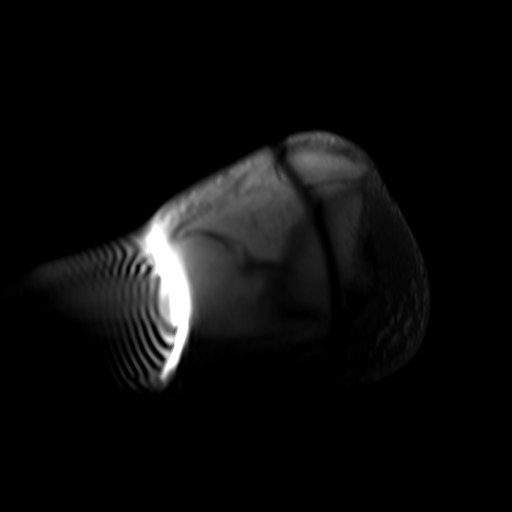
[im 14/66]
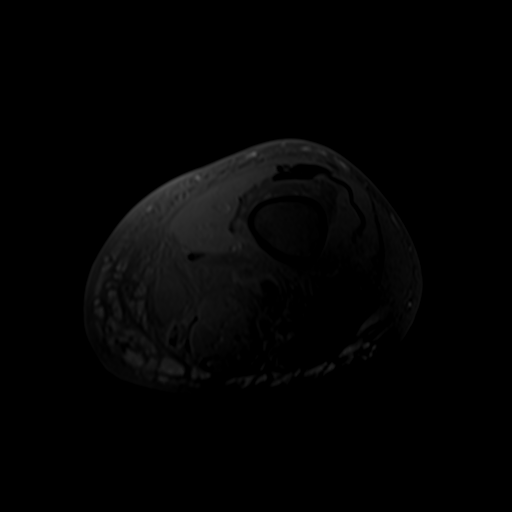
[im 27/66]
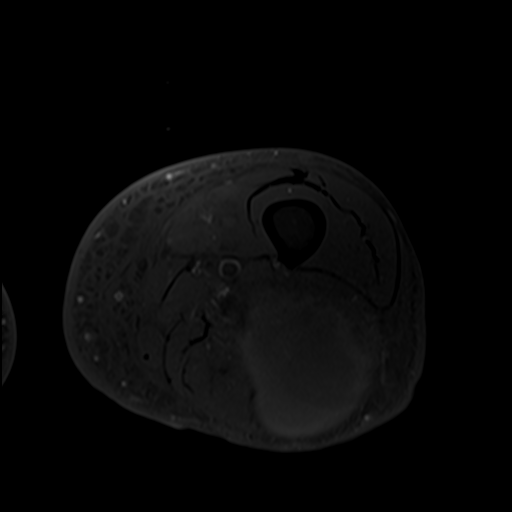
[im 40/66]
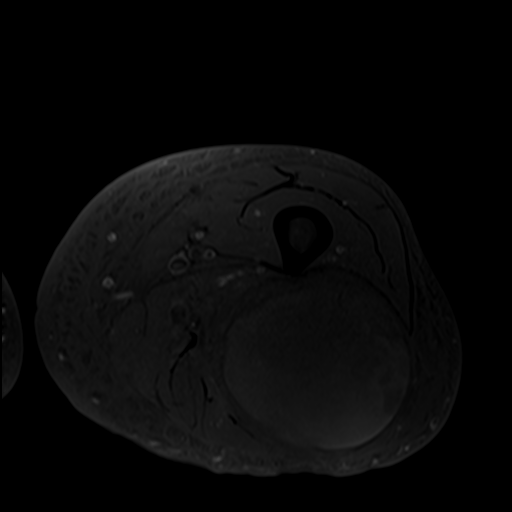
[im 53/66]
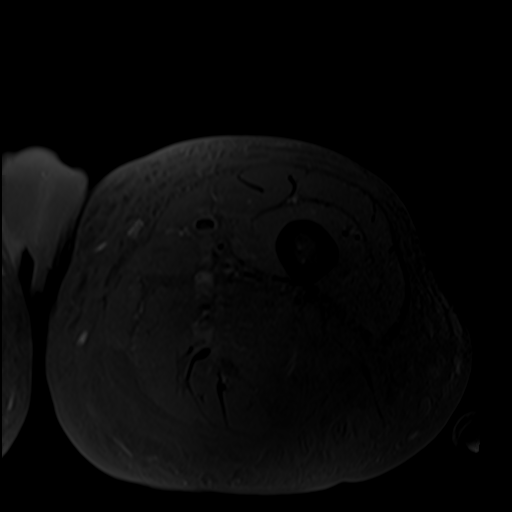
[im 66/66]
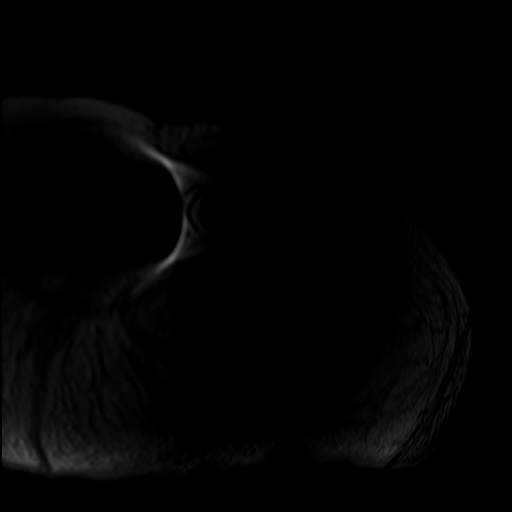

[Series 9: post axial ti · axial · left · 5.0mm · 0.51mm/px · z∈[-150,+173]mm · 5 of 66 slices shown]
[im 1/66]
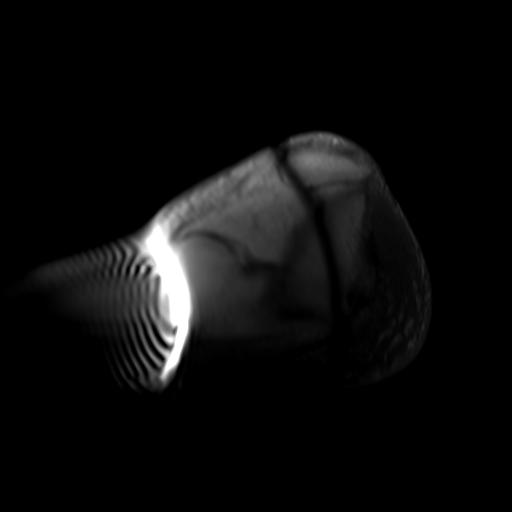
[im 14/66]
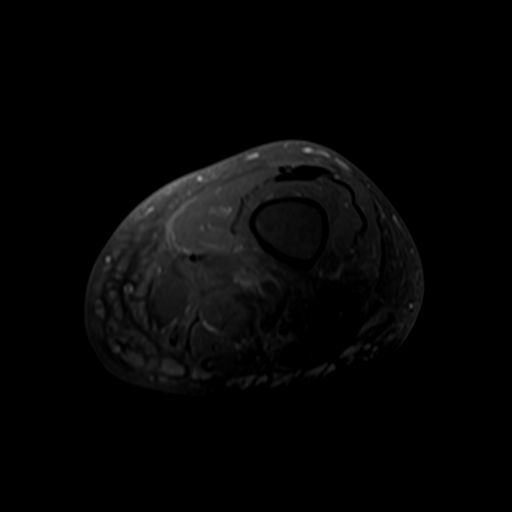
[im 27/66]
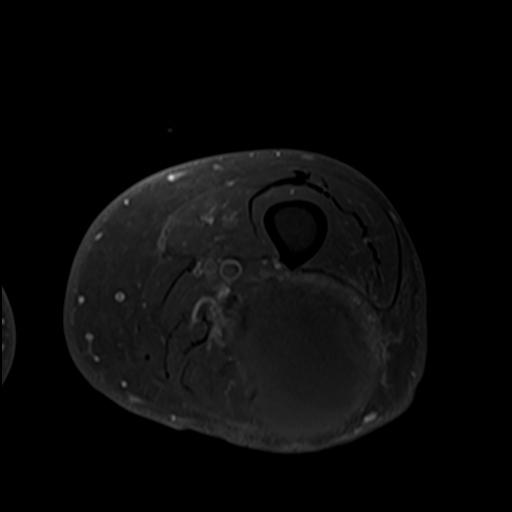
[im 40/66]
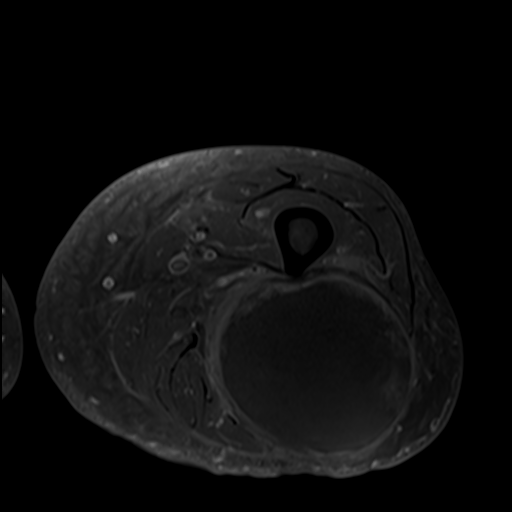
[im 53/66]
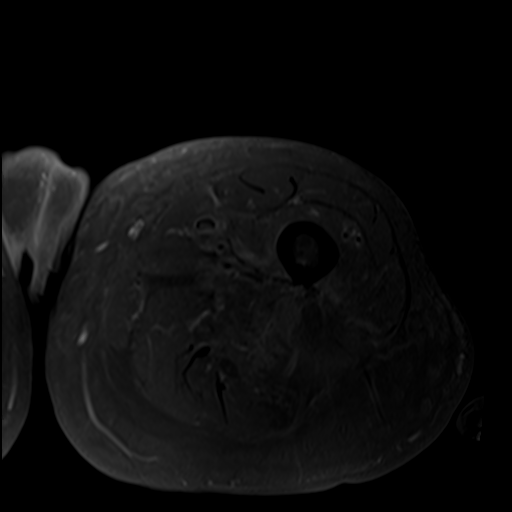

[35 of 40 positions shown; findings below may reference images not displayed]

FINDINGS: Bones/Joint/Cartilage

No marrow signal abnormality. No fracture or dislocation. Normal
alignment. No joint effusion. No erosive changes. No periostitis.

Ligaments, Muscles and Tendons
18 x 10 x 10.7 cm heterogeneous mass in the left biceps femoris
muscle with areas of T1 hyperintensity, numerous septations and
overall T2 hyperintensity throughout the cystic mass. There has been
significant interval improvement in the soft tissue component seen
on the prior examination of [DATE] with only a thin rim of
enhancing soft tissue persisting most notably along the superior
aspect. There is mild edema and enhancement of the proximal and
distal biceps femoris muscle adjacent to the mass which may reflect
radiation related myositis. The mass medially displaces the left
sciatic nerve.

Remainder of the muscles demonstrate no focal abnormality.

Soft tissue
No other fluid collection or hematoma. No other soft tissue mass.
Generalized soft tissue edema throughout the thigh.
IMPRESSION: 1. Large complex cystic mass in the left biceps femoris muscle
measuring 18 x 10 x 10.7 cm most consistent with a soft tissue
sarcoma. There has been significant interval improvement in the soft
tissue component seen on the prior examination of [DATE] with
only a thin rim of enhancing soft tissue persisting most notably
along the superior aspect.
2. Mild edema and enhancement of the proximal and distal biceps
femoris muscle adjacent to the mass which may reflect radiation
related myositis.

## 2021-03-23 MED ORDER — GADOBUTROL 1 MMOL/ML IV SOLN
8.0000 mL | Freq: Once | INTRAVENOUS | Status: AC | PRN
  Administered 2021-03-23: 8 mL via INTRAVENOUS

## 2021-03-23 NOTE — Therapy (Signed)
[place], [place], [address] Phone: [phone]   Fax:  [phone]  Physical Therapy Treatment  Patient Details  Name: Joseph Werner MRN: [id number] Date of Birth: [birth year] Referring Provider (PT): Joseph Werner   Encounter Date: 03/23/2021   PT End of Session - 03/23/21 1354     Visit Number 13    Number of Visits 25    Date for PT Re-Evaluation 05/05/21    PT Start Time 1130    PT Stop Time 1157    PT Time Calculation (min) 27 min    Activity Tolerance Patient tolerated treatment well    Behavior During Therapy [facility] for tasks assessed/performed             Past Medical History:  Diagnosis Date   Anemia    macrocytic anemia   Arthritis    Bradycardia    chronic   CAD (coronary artery disease)    Dyslipidemia    Eczema    Hyperlipidemia    Hypothyroid    Loss of weight    Macrocytosis    with normal bone marrow biopsy in 2006 may be benign varient, evaluated in the pastby hematologistDr. Benay Werner   Osteopenia    on bone density test from orthopedist Dr. Nelva Werner, bone density test in July 2010 showed T score-1.1 in the lumbar spine and 2.1 in the femur. Bone density in August 2012 showed T score 2.3 in the femur and 2.2 in the forearm   Postsurgical aortocoronary bypass status    Prostate cancer [facility])    H/O   Prostate cancer [facility])    followed by urology Dr. Diona Werner    Past Surgical History:  Procedure Laterality Date   [place] Right 11/18/2012   Procedure: HERNIA REPAIR INGUINAL ADULT;  Surgeon: Joseph Roof, MD;  Location: [place];  Service: General;  Laterality: Right;   INSERTION OF MESH Right 11/18/2012   Procedure: INSERTION OF MESH;  Surgeon: Joseph Roof, MD;  Location: [place];  Service: General;  Laterality: Right;   PROSTATECTOMY      There were  no vitals filed for this visit.   Subjective Assessment - 03/23/21 1122     Subjective just came from my MRI    Pertinent History L thigh sarcoma, currently undergoing radiation, plan is to undergo surgery - 30 days later, heart bypass surgery, gallbladder removed, prostate cancer    Currently in Pain? No/denies                               [facility] Adult PT Treatment/Exercise - 03/23/21 0001       Manual Therapy   Manual Lymphatic Drainage (MLD) very briefly mainly focusing on distal to proximal with lotion application to inguinal nodes    Compression Bandaging leg washed, lotion to entire leg, tg soft from foot to knee, artiflex from foot to knee, elastomull to digits 1-2, 1 6 cm short stretch at foot, 1 8 cm at ankle, 1 12 cm from ankle to knee                         PT Long Term Goals - 03/04/21 1322       PT LONG  TERM GOAL #1   Title Pt and/or spouse will be independent in self MLD for long term management of lymphedema.    Time 8    Period Weeks    Status On-going      PT LONG TERM GOAL #2   Title Pt will obtain appropriate compression garments for long term management of lymphedema.    Time 8    Period Weeks    Status On-going      PT LONG TERM GOAL #3   Title Pt will demonstrate a 3 cm decrease in circumference at 20 cm proximal to L suprapatella to decrease risk of infection.    Baseline 63 cm at baseline, 65 cm on 7/8.2022 ( measured over sarcoma site)    Time 8    Period Weeks    Status On-going      PT LONG TERM GOAL #4   Title Pt will report a 50% improvement in overall pain in LLE to allow improved comfort.    Baseline He says that the pain has basically gone away 03/04/2021    Status Achieved                   Plan - 03/23/21 1354     Clinical Impression Statement Pt arrived to rebandage post MRI today.  It was known that pt would arrive when able for bandage focus.  Reapplied with leg / skin tolerating well.              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

## 2021-03-24 NOTE — Progress Notes (Signed)
  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 During treatment he did very well with radiotherapy and did not have significant desquamation. He did have to meet with PT due to lower extremity edema.  He was seen today and PT noted he has a surgical date of 04/11/21.  Impression/Plan: 1. 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 I was unable to reach the patient today but left a voicemail and on the message discussed that we would be happy to continue to follow him as needed, but he will also continue to follow up with Dr. Sherlynn Stalls at [facility] as well as in medical oncology at [facility].      Carola Rhine, PAC

## 2021-03-25 ENCOUNTER — Other Ambulatory Visit: Payer: Self-pay

## 2021-03-25 ENCOUNTER — Ambulatory Visit: Payer: Medicare Other | Admitting: Rehabilitation

## 2021-03-25 DIAGNOSIS — M79605 Pain in left leg: Secondary | ICD-10-CM

## 2021-03-25 DIAGNOSIS — I890 Lymphedema, not elsewhere classified: Secondary | ICD-10-CM | POA: Diagnosis not present

## 2021-03-25 NOTE — Therapy (Signed)
Joseph Werner, [place], [address] Phone: [phone]   Fax:  [phone]  Physical Therapy Treatment  Patient Details  Name: Joseph Werner MRN: [id number] Date of Birth: 07-31-1935 Referring Provider (PT): Shona Simpson   Encounter Date: 03/25/2021   PT End of Session - 03/25/21 1201     Visit Number 14    Number of Visits 25    Date for PT Re-Evaluation 05/05/21    PT Start Time 1100    PT Stop Time 1200    PT Time Calculation (min) 60 min    Activity Tolerance Patient tolerated treatment well    Behavior During Therapy [facility] for tasks assessed/performed             Past Medical History:  Diagnosis Date   Anemia    macrocytic anemia   Arthritis    Bradycardia    chronic   CAD (coronary artery disease)    Dyslipidemia    Eczema    Hyperlipidemia    Hypothyroid    Loss of weight    Macrocytosis    with normal bone marrow biopsy in 2006 may be benign varient, evaluated in the pastby hematologistDr. Benay Spice   Osteopenia    on bone density test from orthopedist Dr. Nelva Bush, bone density test in July 2010 showed T score-1.1 in the lumbar spine and 2.1 in the femur. Bone density in August 2012 showed T score 2.3 in the femur and 2.2 in the forearm   Postsurgical aortocoronary bypass status    Prostate cancer [facility])    H/O   Prostate cancer [facility])    followed by urology Dr. Diona Fanti    Past Surgical History:  Procedure Laterality Date   [place] Right 11/18/2012   Procedure: HERNIA REPAIR INGUINAL ADULT;  Surgeon: Merrie Roof, MD;  Location: [place];  Service: General;  Laterality: Right;   INSERTION OF MESH Right 11/18/2012   Procedure: INSERTION OF MESH;  Surgeon: Merrie Roof, MD;  Location: [place];  Service: General;  Laterality: Right;   PROSTATECTOMY      There were  no vitals filed for this visit.   Subjective Assessment - 03/25/21 1159     Subjective nothing new    Pertinent History L thigh sarcoma, currently undergoing radiation, plan is to undergo surgery - 30 days later, heart bypass surgery, gallbladder removed, prostate cancer    Currently in Pain? No/denies                               [facility] Adult PT Treatment/Exercise - 03/25/21 0001       Manual Therapy   Manual Lymphatic Drainage (MLD) very briefly mainly focusing on distal to proximal with lotion application to inguinal nodes    Compression Bandaging leg washed, lotion to entire leg, tg soft from foot to knee, artiflex from foot to knee, elastomull to digits 1-2, 1 6 cm short stretch at foot, 1 8 cm at ankle, 1 12 cm from ankle to knee                         PT Long Term Goals - 03/04/21 1322       PT LONG TERM GOAL #1  Title Pt and/or spouse will be independent in self MLD for long term management of lymphedema.    Time 8    Period Weeks    Status On-going      PT LONG TERM GOAL #2   Title Pt will obtain appropriate compression garments for long term management of lymphedema.    Time 8    Period Weeks    Status On-going      PT LONG TERM GOAL #3   Title Pt will demonstrate a 3 cm decrease in circumference at 20 cm proximal to L suprapatella to decrease risk of infection.    Baseline 63 cm at baseline, 65 cm on 7/8.2022 ( measured over sarcoma site)    Time 8    Period Weeks    Status On-going      PT LONG TERM GOAL #4   Title Pt will report a 50% improvement in overall pain in LLE to allow improved comfort.    Baseline He says that the pain has basically gone away 03/04/2021    Status Achieved                   Plan - 03/25/21 1201     Clinical Impression Statement Pt continues to tolerace bandaging to the knee.    PT Frequency 3x / week    PT Duration 4 weeks    PT Treatment/Interventions ADLs/Self Care Home  Management;Therapeutic exercise;Therapeutic activities;Patient/family education;Manual techniques;Manual lymph drainage;Compression bandaging;Taping;Vasopneumatic Device    PT Next Visit Plan cont MLD and bandaging to LLE and instruct pt and spouse, see if thigh increased and rebandage if needed, assess skin, see if compression garment fits LLE, pt can try to wear compression stocking if skin on posterior thigh looks good    Consulted and Agree with Plan of Care Patient             Patient will benefit from skilled therapeutic intervention in order to improve the following deficits and impairments:     Visit Diagnosis: Lymphedema, not elsewhere classified  Pain in left leg     Problem List Patient Active Problem List   Diagnosis Date Noted   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

## 2021-03-28 ENCOUNTER — Other Ambulatory Visit: Payer: Self-pay

## 2021-03-28 ENCOUNTER — Ambulatory Visit: Payer: Medicare Other | Attending: Radiation Oncology | Admitting: Rehabilitation

## 2021-03-28 ENCOUNTER — Encounter: Payer: Self-pay | Admitting: Rehabilitation

## 2021-03-28 DIAGNOSIS — M79605 Pain in left leg: Secondary | ICD-10-CM | POA: Diagnosis present

## 2021-03-28 DIAGNOSIS — I890 Lymphedema, not elsewhere classified: Secondary | ICD-10-CM | POA: Insufficient documentation

## 2021-03-28 NOTE — Therapy (Signed)
[place], [place], [address] Phone: [phone]   Fax:  [phone]  Physical Therapy Treatment  Patient Details  Name: Joseph Werner MRN: [id number] Date of Birth: 1934/11/11 Referring Provider (PT): Shona Simpson   Encounter Date: 03/28/2021   PT End of Session - 03/28/21 1206     Visit Number 15    Number of Visits 25    Date for PT Re-Evaluation 05/05/21    PT Start Time 1100    PT Stop Time 1200    PT Time Calculation (min) 60 min    Activity Tolerance Patient tolerated treatment well    Behavior During Therapy [facility] for tasks assessed/performed             Past Medical History:  Diagnosis Date   Anemia    macrocytic anemia   Arthritis    Bradycardia    chronic   CAD (coronary artery disease)    Dyslipidemia    Eczema    Hyperlipidemia    Hypothyroid    Loss of weight    Macrocytosis    with normal bone marrow biopsy in 2006 may be benign varient, evaluated in the pastby hematologistDr. Benay Spice   Osteopenia    on bone density test from orthopedist Dr. Nelva Bush, bone density test in July 2010 showed T score-1.1 in the lumbar spine and 2.1 in the femur. Bone density in August 2012 showed T score 2.3 in the femur and 2.2 in the forearm   Postsurgical aortocoronary bypass status    Prostate cancer [facility])    H/O   Prostate cancer [facility])    followed by urology Dr. Diona Fanti    Past Surgical History:  Procedure Laterality Date   [place] Right 11/18/2012   Procedure: HERNIA REPAIR INGUINAL ADULT;  Surgeon: Merrie Roof, MD;  Location: [place];  Service: General;  Laterality: Right;   INSERTION OF MESH Right 11/18/2012   Procedure: INSERTION OF MESH;  Surgeon: Merrie Roof, MD;  Location: [place];  Service: General;  Laterality: Right;   PROSTATECTOMY      There were no  vitals filed for this visit.   Subjective Assessment - 03/28/21 1050     Subjective It is about the same    Pertinent History L thigh sarcoma, currently undergoing radiation, plan is to undergo surgery - 30 days later, heart bypass surgery, gallbladder removed, prostate cancer    Patient Stated Goals to wake up one morning without any pain and be able to walk across the room like nothing happened    Currently in Pain? No/denies                               [facility] Adult PT Treatment/Exercise - 03/28/21 0001       Manual Therapy   Manual therapy comments measured pt for juzo dynamic knee high stocking as pt has reduced very well and we discussed being able to do this which waiting for surgery and after to minimize vists and need for bandaging    Manual Lymphatic Drainage (MLD) supine HOB elevated, short neck, abdominals, left axillary nodes, axillo inguinal nodes then Lt LE proximal to distal and retracing steps with focus on foot and malleoli    Compression Bandaging  leg washed, lotion to entire leg, tg soft from foot to knee, artiflex from foot to knee, elastomull to digits 1-2, 1 6 cm short stretch at foot, 1 8 cm at ankle, 1 12 cm from ankle to knee                         PT Long Term Goals - 03/04/21 1322       PT LONG TERM GOAL #1   Title Pt and/or spouse will be independent in self MLD for long term management of lymphedema.    Time 8    Period Weeks    Status On-going      PT LONG TERM GOAL #2   Title Pt will obtain appropriate compression garments for long term management of lymphedema.    Time 8    Period Weeks    Status On-going      PT LONG TERM GOAL #3   Title Pt will demonstrate a 3 cm decrease in circumference at 20 cm proximal to L suprapatella to decrease risk of infection.    Baseline 63 cm at baseline, 65 cm on 7/8.2022 ( measured over sarcoma site)    Time 8    Period Weeks    Status On-going      PT LONG TERM GOAL #4    Title Pt will report a 50% improvement in overall pain in LLE to allow improved comfort.    Baseline He says that the pain has basically gone away 03/04/2021    Status Achieved                   Plan - 03/28/21 1206     Clinical Impression Statement Discussed options while waiting for sarcoma surgery with the thigh high garment not fitting well.  Went over flat knit vs circular and velcro options.  Showed pt how to order juzo dynamic closed toe stocking with education on how 30-40 would be best but if they think they can't get it on they could try 20-30.  Pt will consider this as he is now pretty much waiting for surgery and recovery.    PT Frequency 3x / week    PT Duration 4 weeks    PT Treatment/Interventions ADLs/Self Care Home Management;Therapeutic exercise;Therapeutic activities;Patient/family education;Manual techniques;Manual lymph drainage;Compression bandaging;Taping;Vasopneumatic Device    PT Next Visit Plan how was surgeon visit? order stocking?  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

## 2021-03-30 ENCOUNTER — Encounter: Payer: Medicare Other | Admitting: Physical Therapy

## 2021-04-01 ENCOUNTER — Ambulatory Visit: Payer: Medicare Other | Admitting: Physical Therapy

## 2021-04-01 ENCOUNTER — Other Ambulatory Visit: Payer: Self-pay

## 2021-04-01 DIAGNOSIS — I890 Lymphedema, not elsewhere classified: Secondary | ICD-10-CM

## 2021-04-01 DIAGNOSIS — M79605 Pain in left leg: Secondary | ICD-10-CM

## 2021-04-01 NOTE — Therapy (Signed)
[place], [place], [address] Phone: [phone]   Fax:  [phone]  Physical Therapy Treatment  Patient Details  Name: Joseph Werner MRN: [id number] Date of Birth: 01-14-1935 Referring Provider (PT): Shona Simpson   Encounter Date: 04/01/2021   PT End of Session - 04/01/21 1224     Visit Number 16    Number of Visits 25    Date for PT Re-Evaluation 05/05/21    PT Start Time 1100    PT Stop Time 1205    PT Time Calculation (min) 65 min    Activity Tolerance Patient tolerated treatment well    Behavior During Therapy [facility] for tasks assessed/performed             Past Medical History:  Diagnosis Date   Anemia    macrocytic anemia   Arthritis    Bradycardia    chronic   CAD (coronary artery disease)    Dyslipidemia    Eczema    Hyperlipidemia    Hypothyroid    Loss of weight    Macrocytosis    with normal bone marrow biopsy in 2006 may be benign varient, evaluated in the pastby hematologistDr. Benay Spice   Osteopenia    on bone density test from orthopedist Dr. Nelva Bush, bone density test in July 2010 showed T score-1.1 in the lumbar spine and 2.1 in the femur. Bone density in August 2012 showed T score 2.3 in the femur and 2.2 in the forearm   Postsurgical aortocoronary bypass status    Prostate cancer [facility])    H/O   Prostate cancer [facility])    followed by urology Dr. Diona Fanti    Past Surgical History:  Procedure Laterality Date   [place] Right 11/18/2012   Procedure: HERNIA REPAIR INGUINAL ADULT;  Surgeon: Merrie Roof, MD;  Location: [place];  Service: General;  Laterality: Right;   INSERTION OF MESH Right 11/18/2012   Procedure: INSERTION OF MESH;  Surgeon: Merrie Roof, MD;  Location: [place];  Service: General;  Laterality: Right;   PROSTATECTOMY      There were no  vitals filed for this visit.   Subjective Assessment - 04/01/21 1108     Subjective Pt is scheduled for surgery on August 15.  He is having trouble getting getting his Covid test he need prior to surgery.  He comes wearing the stocking on her right leg.    Pertinent History L thigh sarcoma, currently undergoing radiation, plan is to undergo surgery - 30 days later, heart bypass surgery, gallbladder removed, prostate cancer                   LYMPHEDEMA/ONCOLOGY QUESTIONNAIRE - 04/01/21 0001       Left Lower Extremity Lymphedema   30 cm Proximal to Suprapatella 61 cm    10 cm Proximal to Suprapatella 49 cm    At Midpatella/Popliteal Crease 45 cm   pt had only been bandaging below the knee   30 cm Proximal to Floor at Lateral Plantar Foot 30.6 cm    20 cm Proximal to Floor at Lateral Plantar Foot 23.3 cm    10 cm Proximal to Floor at Lateral Malleoli 25.2 cm    5 cm Proximal to 1st MTP Joint 23.5 cm    Across MTP Joint  24 cm    Around Proximal Great Toe 7.8 cm                        [facility] Adult PT Treatment/Exercise - 04/01/21 0001       Manual Therapy   Manual therapy comments pt brought in his knee high stockings as recommendedn last visit.  PT was able to apply stocking and it appeared to fit well Pt did not think wife would be able to apply them and his wife said she thought she could. Attempted to don with Medi butler, but pt was not able to tolerate it. Showed pt the "Sigvaris Footslip" on compression guru" as an option to help don stocking that is open toed. Pt wife discussed getting open toes stockings as well to see if this device will help her don the stockings.    Compression Bandaging leg washed, lotion to entire leg, tg soft from foot to knee,and a large piece from knee to groin artiflex from foot to knee, elastomull to digits 1-2, 1 6 cm short stretch at foot, 1 8 cm at ankle, 1 12 cm from ankle to knee                         PT Long  Term Goals - 03/04/21 1322       PT LONG TERM GOAL #1   Title Pt and/or spouse will be independent in self MLD for long term management of lymphedema.    Time 8    Period Weeks    Status On-going      PT LONG TERM GOAL #2   Title Pt will obtain appropriate compression garments for long term management of lymphedema.    Time 8    Period Weeks    Status On-going      PT LONG TERM GOAL #3   Title Pt will demonstrate a 3 cm decrease in circumference at 20 cm proximal to L suprapatella to decrease risk of infection.    Baseline 63 cm at baseline, 65 cm on 7/8.2022 ( measured over sarcoma site)    Time 8    Period Weeks    Status On-going      PT LONG TERM GOAL #4   Title Pt will report a 50% improvement in overall pain in LLE to allow improved comfort.    Baseline He says that the pain has basically gone away 03/04/2021    Status Achieved                   Plan - 04/01/21 1225     Clinical Impression Statement Pt has had good reduction in his lower leg but has developed fullness in his knee and thigh since he is no longer wearing any compression to this area.  He stopped becasue the bandages were causing blisters on this thigh as they slid down. Since he is not longer using bandages to his thigh, his skin has healed.  Tried a tg soft on his thigh to see if that will help decongest his thigh some.    Personal Factors and Comorbidities Age;Comorbidity 1    Comorbidities previous hx of prostate cancer    Examination-Activity Limitations Stand;Sit    Examination-Participation Restrictions Driving;Yard Work;Community Activity    Stability/Clinical Decision Making Stable/Uncomplicated    Rehab Potential Good    PT Frequency 3x / week    PT Duration 4 weeks  PT Treatment/Interventions ADLs/Self Care Home Management;Therapeutic exercise;Therapeutic activities;Patient/family education;Manual techniques;Manual lymph drainage;Compression bandaging;Taping;Vasopneumatic Device    PT  Next Visit Plan how did tg soft work on his thigh? skin ok?  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 Owens Shark PT  Norwood Levo 04/01/2021, 12:28 PM  [place], [place], [address] Phone: [phone]   Fax:  [phone]  Name: SALVADORE VALVANO MRN: [id number] Date of Birth: 07/06/1935

## 2021-04-04 ENCOUNTER — Ambulatory Visit
Admission: RE | Admit: 2021-04-04 | Discharge: 2021-04-04 | Disposition: A | Discharge status: Home / Self Care | Payer: Medicare Other | Source: Ambulatory Visit | Attending: Radiation Oncology | Admitting: Radiation Oncology

## 2021-04-04 ENCOUNTER — Other Ambulatory Visit: Payer: Self-pay

## 2021-04-04 ENCOUNTER — Ambulatory Visit: Payer: Medicare Other | Admitting: Physical Therapy

## 2021-04-04 ENCOUNTER — Encounter: Payer: Self-pay | Admitting: Physical Therapy

## 2021-04-04 DIAGNOSIS — I890 Lymphedema, not elsewhere classified: Secondary | ICD-10-CM | POA: Diagnosis not present

## 2021-04-04 DIAGNOSIS — C4922 Malignant neoplasm of connective and soft tissue of left lower limb, including hip: Secondary | ICD-10-CM | POA: Insufficient documentation

## 2021-04-04 DIAGNOSIS — M79605 Pain in left leg: Secondary | ICD-10-CM

## 2021-04-04 NOTE — Therapy (Signed)
[place], [place], [address] Phone: [phone]   Fax:  [phone]  Physical Therapy Treatment  Patient Details  Name: Joseph Werner MRN: [id number] Date of Birth: [birth year] Referring Provider (PT): Shona Simpson   Encounter Date: 04/04/2021   PT End of Session - 04/04/21 1158     Visit Number 17    Number of Visits 25    Date for PT Re-Evaluation 05/05/21    PT Start Time 1106    PT Stop Time 1200    PT Time Calculation (min) 54 min    Activity Tolerance Patient tolerated treatment well    Behavior During Therapy [facility] for tasks assessed/performed             Past Medical History:  Diagnosis Date   Anemia    macrocytic anemia   Arthritis    Bradycardia    chronic   CAD (coronary artery disease)    Dyslipidemia    Eczema    Hyperlipidemia    Hypothyroid    Loss of weight    Macrocytosis    with normal bone marrow biopsy in 2006 may be benign varient, evaluated in the pastby hematologistDr. Benay Spice   Osteopenia    on bone density test from orthopedist Dr. Nelva Bush, bone density test in July 2010 showed T score-1.1 in the lumbar spine and 2.1 in the femur. Bone density in August 2012 showed T score 2.3 in the femur and 2.2 in the forearm   Postsurgical aortocoronary bypass status    Prostate cancer [facility])    H/O   Prostate cancer [facility])    followed by urology Dr. Diona Fanti    Past Surgical History:  Procedure Laterality Date   [place] Right 11/18/2012   Procedure: HERNIA REPAIR INGUINAL ADULT;  Surgeon: Merrie Roof, MD;  Location: [place];  Service: General;  Laterality: Right;   INSERTION OF MESH Right 11/18/2012   Procedure: INSERTION OF MESH;  Surgeon: Merrie Roof, MD;  Location: [place];  Service: General;  Laterality: Right;   PROSTATECTOMY      There were no  vitals filed for this visit.   Subjective Assessment - 04/04/21 1107     Subjective The MRI looked really good and I now have a surgery date on August 15th.    Pertinent History L thigh sarcoma, currently undergoing radiation, plan is to undergo surgery - 30 days later, heart bypass surgery, gallbladder removed, prostate cancer    Patient Stated Goals to wake up one morning without any pain and be able to walk across the room like nothing happened    Currently in Pain? No/denies    Pain Score 0-No pain                               [facility] Adult PT Treatment/Exercise - 04/04/21 0001       Manual Therapy   Manual Lymphatic Drainage (MLD) supine HOB elevated, short neck, abdominals, left axillary nodes, axillo inguinal nodes then Lt LE proximal to distal and retracing steps with focus on foot and malleoli    Compression Bandaging leg washed, lotion to entire leg, tg soft from foot to knee,and a large piece from knee to groin artiflex from foot to knee,  elastomull to digits 1-2, 1 6 cm short stretch at foot, 1 8 cm at ankle, 1 12 cm from ankle to knee                         PT Long Term Goals - 03/04/21 1322       PT LONG TERM GOAL #1   Title Pt and/or spouse will be independent in self MLD for long term management of lymphedema.    Time 8    Period Weeks    Status On-going      PT LONG TERM GOAL #2   Title Pt will obtain appropriate compression garments for long term management of lymphedema.    Time 8    Period Weeks    Status On-going      PT LONG TERM GOAL #3   Title Pt will demonstrate a 3 cm decrease in circumference at 20 cm proximal to L suprapatella to decrease risk of infection.    Baseline 63 cm at baseline, 65 cm on 7/8.2022 ( measured over sarcoma site)    Time 8    Period Weeks    Status On-going      PT LONG TERM GOAL #4   Title Pt will report a 50% improvement in overall pain in LLE to allow improved comfort.    Baseline He  says that the pain has basically gone away 03/04/2021    Status Achieved                   Plan - 04/04/21 1159     Clinical Impression Statement Pt is still awaiting arrival of his open toe compression stocking for his LLE. Once he receives this and the donning aid will instruct pt on proper way to don/doff. He has two more remaining appointments prior to his surgery to remove the tumor from his left thigh. His thigh was reduced some with TG soft from last session so re donned it this session.    PT Frequency 3x / week    PT Duration 4 weeks    PT Treatment/Interventions ADLs/Self Care Home Management;Therapeutic exercise;Therapeutic activities;Patient/family education;Manual techniques;Manual lymph drainage;Compression bandaging;Taping;Vasopneumatic Device    PT Next Visit Plan cont MLD and bandaging to LLE and instruct pt and spouse, see if thigh increased and rebandage if needed,    Consulted and Agree with Plan of Care Patient             Patient will benefit from skilled therapeutic intervention in order to improve the following deficits and impairments:  Pain, Increased edema, Decreased mobility  Visit Diagnosis: Lymphedema, not elsewhere classified  Pain in left leg     Problem List Patient Active Problem List   Diagnosis Date Noted   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

## 2021-04-06 ENCOUNTER — Other Ambulatory Visit: Payer: Self-pay

## 2021-04-06 ENCOUNTER — Ambulatory Visit: Payer: Medicare Other | Admitting: Physical Therapy

## 2021-04-06 ENCOUNTER — Encounter: Payer: Self-pay | Admitting: Physical Therapy

## 2021-04-06 DIAGNOSIS — I890 Lymphedema, not elsewhere classified: Secondary | ICD-10-CM | POA: Diagnosis not present

## 2021-04-06 DIAGNOSIS — M79605 Pain in left leg: Secondary | ICD-10-CM

## 2021-04-06 NOTE — Therapy (Signed)
[place], [place], [address] Phone: [phone]   Fax:  [phone]  Physical Therapy Treatment  Patient Details  Name: Joseph Werner MRN: [id number] Date of Birth: 1935/05/12 Referring Provider (PT): Shona Simpson   Encounter Date: 04/06/2021   PT End of Session - 04/06/21 1200     Visit Number 18    Number of Visits 25    Date for PT Re-Evaluation 05/05/21    PT Start Time 1108    PT Stop Time 1200    PT Time Calculation (min) 52 min    Activity Tolerance Patient tolerated treatment well    Behavior During Therapy [facility] for tasks assessed/performed             Past Medical History:  Diagnosis Date   Anemia    macrocytic anemia   Arthritis    Bradycardia    chronic   CAD (coronary artery disease)    Dyslipidemia    Eczema    Hyperlipidemia    Hypothyroid    Loss of weight    Macrocytosis    with normal bone marrow biopsy in 2006 may be benign varient, evaluated in the pastby hematologistDr. Benay Spice   Osteopenia    on bone density test from orthopedist Dr. Nelva Bush, bone density test in July 2010 showed T score-1.1 in the lumbar spine and 2.1 in the femur. Bone density in August 2012 showed T score 2.3 in the femur and 2.2 in the forearm   Postsurgical aortocoronary bypass status    Prostate cancer [facility])    H/O   Prostate cancer [facility])    followed by urology Dr. Diona Fanti    Past Surgical History:  Procedure Laterality Date   [place] Right 11/18/2012   Procedure: HERNIA REPAIR INGUINAL ADULT;  Surgeon: Merrie Roof, MD;  Location: [place];  Service: General;  Laterality: Right;   INSERTION OF MESH Right 11/18/2012   Procedure: INSERTION OF MESH;  Surgeon: Merrie Roof, MD;  Location: [place];  Service: General;  Laterality: Right;   PROSTATECTOMY      There were  no vitals filed for this visit.   Subjective Assessment - 04/06/21 1109     Subjective My garment still has not come. It was supposed to be there yesterday.    Pertinent History L thigh sarcoma, currently undergoing radiation, plan is to undergo surgery - 30 days later, heart bypass surgery, gallbladder removed, prostate cancer    Patient Stated Goals to wake up one morning without any pain and be able to walk across the room like nothing happened    Currently in Pain? No/denies    Pain Score 0-No pain                               [facility] Adult PT Treatment/Exercise - 04/06/21 0001       Manual Therapy   Manual Lymphatic Drainage (MLD) supine HOB elevated, short neck, abdominals, left axillary nodes, axillo inguinal nodes then Lt LE proximal to distal and retracing steps with focus on foot and malleoli    Compression Bandaging leg washed, lotion to entire leg, tg soft from foot to knee,and a large piece from knee to groin artiflex from foot to knee, elastomull to  digits 1-2, 1 6 cm short stretch at foot, 1 8 cm at ankle, 1 12 cm from ankle to knee                         PT Long Term Goals - 03/04/21 1322       PT LONG TERM GOAL #1   Title Pt and/or spouse will be independent in self MLD for long term management of lymphedema.    Time 8    Period Weeks    Status On-going      PT LONG TERM GOAL #2   Title Pt will obtain appropriate compression garments for long term management of lymphedema.    Time 8    Period Weeks    Status On-going      PT LONG TERM GOAL #3   Title Pt will demonstrate a 3 cm decrease in circumference at 20 cm proximal to L suprapatella to decrease risk of infection.    Baseline 63 cm at baseline, 65 cm on 7/8.2022 ( measured over sarcoma site)    Time 8    Period Weeks    Status On-going      PT LONG TERM GOAL #4   Title Pt will report a 50% improvement in overall pain in LLE to allow improved comfort.    Baseline He  says that the pain has basically gone away 03/04/2021    Status Achieved                   Plan - 04/06/21 1203     Clinical Impression Statement Pt is still awaiting arrival of his open toe compression stocking for his LLE. Continued with MLD and compression bandaging to L knee. Pt will come back on Friday for bandaging and then will not be seen again until after surgery to remove his sarcoma. Will instruct pt in proper donning/doffing technique for his garment if he has received it by next session. Educated pt to wear this as close to surgery as he can (with dr approval) and to don as soon as possible after surgery (with dr approval).    PT Frequency 3x / week    PT Duration 4 weeks    PT Treatment/Interventions ADLs/Self Care Home Management;Therapeutic exercise;Therapeutic activities;Patient/family education;Manual techniques;Manual lymph drainage;Compression bandaging;Taping;Vasopneumatic Device    PT Next Visit Plan cont MLD and bandaging to LLE and instruct pt and spouse, see if thigh increased and rebandage if needed,    Consulted and Agree with Plan of Care Patient             Patient will benefit from skilled therapeutic intervention in order to improve the following deficits and impairments:  Pain, Increased edema, Decreased mobility  Visit Diagnosis: Lymphedema, not elsewhere classified  Pain in left leg     Problem List Patient Active Problem List   Diagnosis Date Noted   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

## 2021-04-07 ENCOUNTER — Encounter: Payer: Medicare Other | Admitting: Physical Therapy

## 2021-04-08 ENCOUNTER — Ambulatory Visit: Payer: Medicare Other | Admitting: Physical Therapy

## 2021-04-08 ENCOUNTER — Other Ambulatory Visit: Payer: Self-pay

## 2021-04-08 DIAGNOSIS — I890 Lymphedema, not elsewhere classified: Secondary | ICD-10-CM

## 2021-04-08 DIAGNOSIS — M79605 Pain in left leg: Secondary | ICD-10-CM

## 2021-04-08 NOTE — Therapy (Signed)
[place], [place], [address] Phone: [phone]   Fax:  [phone]  Physical Therapy Treatment  Patient Details  Name: Joseph Werner MRN: [id number] Date of Birth: May 28, 1935 Referring Provider (PT): Shona Simpson   Encounter Date: 04/08/2021   PT End of Session - 04/08/21 1100     Visit Number 19    Number of Visits 25    Date for PT Re-Evaluation 05/05/21    PT Start Time 1000    PT Stop Time 1045    PT Time Calculation (min) 45 min    Activity Tolerance Patient tolerated treatment well    Behavior During Therapy [facility] for tasks assessed/performed             Past Medical History:  Diagnosis Date   Anemia    macrocytic anemia   Arthritis    Bradycardia    chronic   CAD (coronary artery disease)    Dyslipidemia    Eczema    Hyperlipidemia    Hypothyroid    Loss of weight    Macrocytosis    with normal bone marrow biopsy in 2006 may be benign varient, evaluated in the pastby hematologistDr. Benay Spice   Osteopenia    on bone density test from orthopedist Dr. Nelva Bush, bone density test in July 2010 showed T score-1.1 in the lumbar spine and 2.1 in the femur. Bone density in August 2012 showed T score 2.3 in the femur and 2.2 in the forearm   Postsurgical aortocoronary bypass status    Prostate cancer [facility])    H/O   Prostate cancer [facility])    followed by urology Dr. Diona Fanti    Past Surgical History:  Procedure Laterality Date   [place] Right 11/18/2012   Procedure: HERNIA REPAIR INGUINAL ADULT;  Surgeon: Merrie Roof, MD;  Location: [place];  Service: General;  Laterality: Right;   INSERTION OF MESH Right 11/18/2012   Procedure: INSERTION OF MESH;  Surgeon: Merrie Roof, MD;  Location: [place];  Service: General;  Laterality: Right;   PROSTATECTOMY      There were  no vitals filed for this visit.   Subjective Assessment - 04/08/21 1006     Subjective Pt is preparing for surgery on Monday.  He is getting his Covid test today. He is using the tg soft up over his thigh    Pertinent History L thigh sarcoma, currently undergoing radiation, plan is to undergo surgery - 30 days later, heart bypass surgery, gallbladder removed, prostate cancer    Patient Stated Goals to wake up one morning without any pain and be able to walk across the room like nothing happened    Currently in Pain? No/denies                   LYMPHEDEMA/ONCOLOGY QUESTIONNAIRE - 04/08/21 0001       Left Lower Extremity Lymphedema   20 cm Proximal to Suprapatella 55 cm    10 cm Proximal to Suprapatella 47.8 cm    At Midpatella/Popliteal Crease 40 cm    30 cm Proximal to Floor at Lateral Plantar Foot 31.5 cm    20 cm Proximal to Floor at Lateral Plantar Foot 24 cm    10 cm Proximal to Floor at Lateral Malleoli 24.6 cm  5 cm Proximal to 1st MTP Joint 23 cm    Across MTP Joint 24 cm    Around Proximal Great Toe 7.8 cm                        [facility] Adult PT Treatment/Exercise - 04/08/21 0001       Manual Therapy   Manual Therapy Edema management;Manual Lymphatic Drainage (MLD);Compression Bandaging    Manual therapy comments remeasured left leg    Manual Lymphatic Drainage (MLD) supine HOB elevated, short neck, abdominals, left axillary nodes, axillo inguinal nodes then Lt LE proximal to distal and retracing steps with focus on foot and malleoli    Compression Bandaging leg washed, lotion to entire leg, tg soft from foot to knee,and a large piece from knee to groin artiflex from foot to knee, elastomull to digits 1-2, 1 6 cm short stretch at foot, 1 8 cm at ankle, 1 12 cm from ankle to knee                    PT Education - 04/08/21 1053     Education Details Ok to use the tg soft on the lower leg after surgery. After doctor re-orders return to PT  after surgery, call for appt to finalize lymphedema management at home    Person(s) Educated Patient;Spouse    Methods Explanation    Comprehension Verbalized understanding                 PT Long Term Goals - 04/08/21 1007       PT LONG TERM GOAL #1   Title Pt and/or spouse will be independent in self MLD for long term management of lymphedema.    Baseline Pt needs to have re-eval after surgery to determine long term managemnt    Time 8    Period Weeks    Status On-going      PT LONG TERM GOAL #2   Title Pt will obtain appropriate compression garments for long term management of lymphedema.    Time 8    Period Weeks    Status On-going      PT LONG TERM GOAL #3   Title Pt will demonstrate a 3 cm decrease in circumference at 20 cm proximal to L suprapatella to decrease risk of infection.    Baseline 63 cm at baseline, 65 cm on 7/8.2022 ( measured over sarcoma site), 55 cm    Status Achieved      PT LONG TERM GOAL #4   Title Pt will report a 50% improvement in overall pain in LLE to allow improved comfort.    Status Achieved                   Plan - 04/08/21 1101     Clinical Impression Statement Pt has very good reduction of lymphedema in his left leg and is preparing for surgical removal of the sarcoma on Monday.  He and wife know that they can return here for lymphedema managment with clearance and re-order from MD.  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 Joseph Werner PT  Joseph Werner 04/08/2021, 11:04 AM  [place], [place], [address] Phone: [phone]   Fax:  [phone]  Name: Joseph Werner MRN: [id number] Date of Birth: [birth year]

## 2021-04-18 ENCOUNTER — Encounter: Payer: Self-pay | Admitting: Physical Therapy

## 2021-04-20 ENCOUNTER — Encounter: Payer: Self-pay | Admitting: Physical Therapy

## 2021-05-04 ENCOUNTER — Ambulatory Visit: Payer: Medicare Other

## 2021-05-06 ENCOUNTER — Ambulatory Visit: Payer: Medicare Other

## 2021-06-08 ENCOUNTER — Other Ambulatory Visit (HOSPITAL_COMMUNITY): Payer: Self-pay | Admitting: Orthopedic Surgery

## 2021-06-08 ENCOUNTER — Other Ambulatory Visit: Payer: Self-pay | Admitting: Orthopedic Surgery

## 2021-06-08 DIAGNOSIS — Z08 Encounter for follow-up examination after completed treatment for malignant neoplasm: Secondary | ICD-10-CM

## 2021-06-21 ENCOUNTER — Ambulatory Visit: Payer: Medicare Other | Attending: Orthopedic Surgery | Admitting: Physical Therapy

## 2021-06-21 ENCOUNTER — Other Ambulatory Visit: Payer: Self-pay

## 2021-06-21 ENCOUNTER — Encounter: Payer: Self-pay | Admitting: Physical Therapy

## 2021-06-21 DIAGNOSIS — I890 Lymphedema, not elsewhere classified: Secondary | ICD-10-CM

## 2021-06-21 DIAGNOSIS — R262 Difficulty in walking, not elsewhere classified: Secondary | ICD-10-CM

## 2021-06-21 DIAGNOSIS — M25661 Stiffness of right knee, not elsewhere classified: Secondary | ICD-10-CM | POA: Diagnosis present

## 2021-06-21 DIAGNOSIS — M25662 Stiffness of left knee, not elsewhere classified: Secondary | ICD-10-CM

## 2021-06-21 DIAGNOSIS — M6281 Muscle weakness (generalized): Secondary | ICD-10-CM

## 2021-06-21 NOTE — Therapy (Signed)
Progress Note Reporting Period 01/25/21 to 06/21/21   See note below for Objective Data and Assessment of Progress/Goals.      [place] [facility] [place], [place], [address] Phone: [phone]   Fax:  [phone]  Physical Therapy Treatment  Patient Details  Name: Joseph Werner MRN: [id number] Date of Birth: 20-Jan-1935 Referring Provider (PT): Shona Simpson   Encounter Date: 06/21/2021   PT End of Session - 06/21/21 1558     Visit Number 20    Number of Visits 32    Date for PT Re-Evaluation 08/02/21    PT Start Time 1455    PT Stop Time 1548    PT Time Calculation (min) 53 min    Activity Tolerance Patient tolerated treatment well    Behavior During Therapy [facility] for tasks assessed/performed             Past Medical History:  Diagnosis Date   Anemia    macrocytic anemia   Arthritis    Bradycardia    chronic   CAD (coronary artery disease)    Dyslipidemia    Eczema    Hyperlipidemia    Hypothyroid    Loss of weight    Macrocytosis    with normal bone marrow biopsy in 2006 may be benign varient, evaluated in the pastby hematologistDr. Benay Spice   Osteopenia    on bone density test from orthopedist Dr. Nelva Bush, bone density test in July 2010 showed T score-1.1 in the lumbar spine and 2.1 in the femur. Bone density in August 2012 showed T score 2.3 in the femur and 2.2 in the forearm   Postsurgical aortocoronary bypass status    Prostate cancer [facility])    H/O   Prostate cancer [facility])    followed by urology Dr. Diona Fanti    Past Surgical History:  Procedure Laterality Date   [place] Right 11/18/2012   Procedure: HERNIA REPAIR INGUINAL ADULT;  Surgeon: Merrie Roof, MD;  Location: [place];  Service: General;  Laterality: Right;   INSERTION OF MESH Right 11/18/2012   Procedure:  INSERTION OF MESH;  Surgeon: Merrie Roof, MD;  Location: [place];  Service: General;  Laterality: Right;   PROSTATECTOMY      There were no vitals filed for this visit.   Subjective Assessment - 06/21/21 1503     Subjective I have been in a knee immobilizer since August 15. I have trouble bending my left knee. I can not get in the front seat of the car. I have to lie down in the backseat.    Pertinent History L thigh sarcoma, currently undergoing radiation, underwent sarcoma removal surgery on 04/11/21, heart bypass surgery, gallbladder removed, prostate cancer    Patient Stated Goals to wake up one morning without any pain and be able to walk across the room like nothing happened    Currently in Pain? No/denies    Pain Score 0-No pain                [facility] PT Assessment - 06/21/21 0001       Assessment   Onset Date/Surgical Date 04/11/21      Precautions   Precautions Other (comment)    Precaution Comments healing from sarcoma removal surgery      Restrictions  Weight Bearing Restrictions No      Balance Screen   Has the patient fallen in the past 6 months No    Has the patient had a decrease in activity level because of a fear of falling?  No    Is the patient reluctant to leave their home because of a fear of falling?  No      Prior Function   Level of Independence Independent with household mobility with device      Cognition   Overall Cognitive Status Within Functional Limits for tasks assessed      Posture/Postural Control   Posture/Postural Control Postural limitations    Postural Limitations Rounded Shoulders;Forward head      AROM   Right Knee Extension -5    Right Knee Flexion 95    Left Knee Extension -18   in sitting; -8 in supine   Left Knee Flexion 56   in sitting     Ambulation/Gait   Ambulation/Gait Yes    Ambulation/Gait Assistance 6: Modified independent (Device/Increase time)    Ambulation Distance (Feet) 10 Feet    Assistive device  Rolling walker    Gait Pattern Step-through pattern;Decreased hip/knee flexion - right;Decreased hip/knee flexion - left;Poor foot clearance - left;Poor foot clearance - right                           [facility] Adult PT Treatment/Exercise - 06/21/21 0001       Knee/Hip Exercises: Stretches   Active Hamstring Stretch Left;2 reps;30 seconds;Limitations    Active Hamstring Stretch Limitations v/c to no slump forward and keep knee straight    Quad Stretch 3 reps;30 seconds;Left    Quad Stretch Limitations done manually by therapist at end of heel slide      Knee/Hip Exercises: Supine   Quad Sets Strengthening;Left;1 set;10 reps   3 sec hold   Short Arc Quad Sets Strengthening;Left;1 set;10 reps    Heel Slides Strengthening;Left;1 set;10 reps    Straight Leg Raises Strengthening;Left;1 set;10 reps                          PT Long Term Goals - 06/21/21 1600       PT LONG TERM GOAL #1   Title Pt and/or spouse will be independent in self MLD for long term management of lymphedema.    Baseline Pt needs to have re-eval after surgery to determine long term managemnt    Time 8    Period Weeks    Status On-going      PT LONG TERM GOAL #2   Title Pt will obtain appropriate compression garments for long term management of lymphedema.    Time 8    Period Weeks    Status On-going      PT LONG TERM GOAL #3   Title Pt will demonstrate a 3 cm decrease in circumference at 20 cm proximal to L suprapatella to decrease risk of infection.    Baseline 63 cm at baseline, 65 cm on 7/8.2022 ( measured over sarcoma site), 55 cm    Time 8    Period Weeks    Status Achieved      PT LONG TERM GOAL #4   Title Pt will report a 50% improvement in overall pain in LLE to allow improved comfort.    Baseline He says that the pain has basically gone away 03/04/2021    Time  8    Period Weeks    Status Achieved      PT LONG TERM GOAL #5   Title Pt will demonstrate 120 degrees of  bilateral knee flexion to allow pt to stand from a chair    Baseline 56 on L and 95 on R    Time 6    Period Weeks    Status New    Target Date 08/02/21      Additional Long Term Goals   Additional Long Term Goals Yes      PT LONG TERM GOAL #6   Title Pt will demonstrate ability to sit to stand from a chair without use of UEs    Baseline unable    Time 6    Period Weeks    Status New    Target Date 08/02/21      PT LONG TERM GOAL #7   Title Pt will be independent in a home exercise program for continued strengthening and stretching    Time 6    Period Weeks    Status New    Target Date 08/02/21      PT LONG TERM GOAL #8   Title Pt will be able to ambulate without need for a front wheeled walker to decrease reliance on assistive device    Time 6    Period Weeks    Status New    Target Date 08/02/21                   Plan - 06/21/21 1542     Clinical Impression Statement Pt returns to PT after undergoing sugery to remove a sarcoma on L posterior thigh on 04/11/21. He has been in a L knee immobilizer since his surgery and was just able to remove it a few days ago. Pt is very limited with bilateral knee ROM especially on the L. He lacks full L knee extension and has only 56 degrees of L knee flexion and 95 degrees of R knee flexion. He is having increased difficulty standing from a chair and is unable to ride in the front seat of the car due to lack of knee mobility. He is currently using a front wheeled walker which has only been since surgery. Pt would benefit from skilled PT services to improve bilateral knee mobility, improve LE strength and improve functional mobility to allow pt to return to PLOF as well as assist pt with obtaining compression garments for bilateral LEs. Began strengthening and stretches today and issued a home exercise program.    PT Frequency 2x / week    PT Duration 6 weeks    PT Treatment/Interventions ADLs/Self Care Home Management;Therapeutic  exercise;Therapeutic activities;Patient/family education;Manual techniques;Manual lymph drainage;Compression bandaging;Taping;Vasopneumatic Device;Gait training;Stair training;Passive range of motion;Scar mobilization;Joint Manipulations    PT Next Visit Plan ROM of bilateral knees with focus on L knee, strengthening bilateral LEs, decreasing use of walker, measure for bilat LE stockings once LLE is healed, hamstring and quad stretches    PT Home Exercise Plan Access Code: [email or web address]    Consulted and Agree with Plan of Care Patient             Patient will benefit from skilled therapeutic intervention in order to improve the following deficits and impairments:  Pain, Postural dysfunction, Increased fascial restricitons, Decreased strength, Decreased range of motion, Decreased scar mobility, Increased edema, Difficulty walking, Decreased skin integrity, Decreased balance  Visit Diagnosis: Decreased ROM of left knee  Decreased ROM of right  knee  Muscle weakness (generalized)  Difficulty in walking, not elsewhere classified  Lymphedema, not elsewhere classified     Problem List Patient Active Problem List   Diagnosis Date Noted   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

## 2021-06-21 NOTE — Patient Instructions (Signed)
Access Code: [email or web address] URL: [email or web address] Date: 06/21/2021 Prepared by: Manus Gunning  Exercises Seated Hamstring Stretch - 1 x daily - 7 x weekly - 1 sets - 5 reps - 30 sec hold Seated Long Arc Quad - 1 x daily - 7 x weekly - 1 sets - 10 reps - 3 sec hold Supine Heel Slide - 1 x daily - 7 x weekly - 1 sets - 10 reps Supine Quad Set - 1 x daily - 7 x weekly - 1 sets - 10 reps - 5 sec hold Straight Leg Raise - 1 x daily - 7 x weekly - 1 sets - 10 reps

## 2021-06-29 ENCOUNTER — Other Ambulatory Visit: Payer: Self-pay

## 2021-06-29 ENCOUNTER — Ambulatory Visit: Payer: Medicare Other | Attending: Orthopedic Surgery | Admitting: Physical Therapy

## 2021-06-29 DIAGNOSIS — M6281 Muscle weakness (generalized): Secondary | ICD-10-CM | POA: Insufficient documentation

## 2021-06-29 DIAGNOSIS — M25661 Stiffness of right knee, not elsewhere classified: Secondary | ICD-10-CM | POA: Diagnosis present

## 2021-06-29 DIAGNOSIS — M25662 Stiffness of left knee, not elsewhere classified: Secondary | ICD-10-CM | POA: Insufficient documentation

## 2021-06-29 DIAGNOSIS — M79605 Pain in left leg: Secondary | ICD-10-CM | POA: Diagnosis present

## 2021-06-29 DIAGNOSIS — R262 Difficulty in walking, not elsewhere classified: Secondary | ICD-10-CM | POA: Insufficient documentation

## 2021-06-29 DIAGNOSIS — I890 Lymphedema, not elsewhere classified: Secondary | ICD-10-CM | POA: Diagnosis present

## 2021-06-29 NOTE — Patient Instructions (Signed)
Hamstring Stretch: Lying on back with sheet around ball of foot, keep knee straight and pull sheet to raise leg up until you feel a moderate stretch Hold for 60 seconds - do 5 reps, 2x/day  Knee flexion stretch: Sitting with knee on edge of bed, let knee bend, can use other foot to increase stretch Hold 60 seconds - do 5 reps, 2x/day  Knee flexion stretch: Sit in chair with knee bent and foot flat, use other foot to help pull L foot back and you can scoot forward to increase stretch Hold 60 secs - do 5 reps, 2x/day

## 2021-06-29 NOTE — Therapy (Signed)
[facility] @ [place], [place], [address] Phone: [phone]   Fax:  [phone]  Physical Therapy Treatment  Patient Details  Name: Joseph Werner MRN: [id number] Date of Birth: March 21, 1935 Referring Provider (PT): Shona Simpson   Encounter Date: 06/29/2021   PT End of Session - 06/29/21 1354     Visit Number 21    Number of Visits 32    Date for PT Re-Evaluation 08/02/21    PT Start Time 1303    PT Stop Time 6599    PT Time Calculation (min) 50 min    Activity Tolerance Patient tolerated treatment well    Behavior During Therapy [facility] for tasks assessed/performed             Past Medical History:  Diagnosis Date   Anemia    macrocytic anemia   Arthritis    Bradycardia    chronic   CAD (coronary artery disease)    Dyslipidemia    Eczema    Hyperlipidemia    Hypothyroid    Loss of weight    Macrocytosis    with normal bone marrow biopsy in 2006 may be benign varient, evaluated in the pastby hematologistDr. Benay Spice   Osteopenia    on bone density test from orthopedist Dr. Nelva Bush, bone density test in July 2010 showed T score-1.1 in the lumbar spine and 2.1 in the femur. Bone density in August 2012 showed T score 2.3 in the femur and 2.2 in the forearm   Postsurgical aortocoronary bypass status    Prostate cancer [facility])    H/O   Prostate cancer [facility])    followed by urology Dr. Diona Fanti    Past Surgical History:  Procedure Laterality Date   [place] Right 11/18/2012   Procedure: HERNIA REPAIR INGUINAL ADULT;  Surgeon: Merrie Roof, MD;  Location: [place];  Service: General;  Laterality: Right;   INSERTION OF MESH Right 11/18/2012   Procedure: INSERTION OF MESH;  Surgeon: Merrie Roof, MD;  Location: [place];  Service: General;  Laterality: Right;   PROSTATECTOMY      There were  no vitals filed for this visit.   Subjective Assessment - 06/29/21 1402     Subjective I have been doing the stretches at home and I started using the cane instead of the walker.    Pertinent History L thigh sarcoma, currently undergoing radiation, underwent sarcoma removal surgery on 04/11/21, heart bypass surgery, gallbladder removed, prostate cancer    Patient Stated Goals to wake up one morning without any pain and be able to walk across the room like nothing happened    Currently in Pain? No/denies    Pain Score 0-No pain                [facility] PT Assessment - 06/29/21 0001       AROM   Left Knee Extension -18    Left Knee Flexion 70                           [facility] Adult PT Treatment/Exercise - 06/29/21 0001       Exercises   Exercises Knee/Hip      Knee/Hip Exercises: Stretches   Passive Hamstring Stretch 5 reps;Left;60 seconds  Quad Stretch 5 reps;60 seconds    Quad Stretch Limitations done manually by therapist at end of heel slide    Other Knee/Hip Stretches pt seated edge of mat with knee at edge using opposite foot to pull left foot back and bend L knee    Other Knee/Hip Stretches worked with pt on transfers seated edge of mat and how to scoot forward over knee after pulling knee as far back as possible and using hands to push up to allow pt to stand from a chair with less assist      Knee/Hip Exercises: Aerobic   Nustep Level 1 x 10 min to help limber up left knee and increase ROM with arms at 12      Manual Therapy   Manual Therapy Passive ROM    Passive ROM to L knee in to flexion and to hip in to flexion to stretch quads and hamstrings                          PT Long Term Goals - 06/21/21 1600       PT LONG TERM GOAL #1   Title Pt and/or spouse will be independent in self MLD for long term management of lymphedema.    Baseline Pt needs to have re-eval after surgery to determine long term managemnt    Time 8     Period Weeks    Status On-going      PT LONG TERM GOAL #2   Title Pt will obtain appropriate compression garments for long term management of lymphedema.    Time 8    Period Weeks    Status On-going      PT LONG TERM GOAL #3   Title Pt will demonstrate a 3 cm decrease in circumference at 20 cm proximal to L suprapatella to decrease risk of infection.    Baseline 63 cm at baseline, 65 cm on 7/8.2022 ( measured over sarcoma site), 55 cm    Time 8    Period Weeks    Status Achieved      PT LONG TERM GOAL #4   Title Pt will report a 50% improvement in overall pain in LLE to allow improved comfort.    Baseline He says that the pain has basically gone away 03/04/2021    Time 8    Period Weeks    Status Achieved      PT LONG TERM GOAL #5   Title Pt will demonstrate 120 degrees of bilateral knee flexion to allow pt to stand from a chair    Baseline 56 on L and 95 on R    Time 6    Period Weeks    Status New    Target Date 08/02/21      Additional Long Term Goals   Additional Long Term Goals Yes      PT LONG TERM GOAL #6   Title Pt will demonstrate ability to sit to stand from a chair without use of UEs    Baseline unable    Time 6    Period Weeks    Status New    Target Date 08/02/21      PT LONG TERM GOAL #7   Title Pt will be independent in a home exercise program for continued strengthening and stretching    Time 6    Period Weeks    Status New    Target Date 08/02/21  PT LONG TERM GOAL #8   Title Pt will be able to ambulate without need for a front wheeled walker to decrease reliance on assistive device    Time 6    Period Weeks    Status New    Target Date 08/02/21                   Plan - 06/29/21 1400     Clinical Impression Statement Started with pt on NuStep to warm up L knee before manual therapy and stretches today. Pt reported increased tightness and stretching with this but was able to tolerate 10 min. Then focused on passive ROM and  stretching to L knee to decrease hamstring and quad tightness. Issued pt several new stretches to begin doing at home to improve ROM. Remeasured knee flexion and extension at end of session and flexion had improved about 15 degrees from eval.    PT Frequency 2x / week    PT Duration 6 weeks    PT Treatment/Interventions ADLs/Self Care Home Management;Therapeutic exercise;Therapeutic activities;Patient/family education;Manual techniques;Manual lymph drainage;Compression bandaging;Taping;Vasopneumatic Device;Gait training;Stair training;Passive range of motion;Scar mobilization;Joint Manipulations    PT Next Visit Plan ROM of bilateral knees with focus on L knee, strengthening bilateral LEs, decreasing use of walker, measure for bilat LE stockings once LLE is healed, hamstring and quad stretches    PT Home Exercise Plan Access Code: [email or web address]    Consulted and Agree with Plan of Care Patient             Patient will benefit from skilled therapeutic intervention in order to improve the following deficits and impairments:  Pain, Postural dysfunction, Increased fascial restricitons, Decreased strength, Decreased range of motion, Decreased scar mobility, Increased edema, Difficulty walking, Decreased skin integrity, Decreased balance  Visit Diagnosis: Decreased ROM of left knee  Decreased ROM of right knee  Muscle weakness (generalized)  Difficulty in walking, not elsewhere classified  Lymphedema, not elsewhere classified  Pain in left leg     Problem List Patient Active Problem List   Diagnosis Date Noted   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

## 2021-06-30 ENCOUNTER — Encounter: Payer: Self-pay | Admitting: Physical Therapy

## 2021-06-30 ENCOUNTER — Ambulatory Visit: Payer: Medicare Other | Admitting: Physical Therapy

## 2021-06-30 DIAGNOSIS — M25661 Stiffness of right knee, not elsewhere classified: Secondary | ICD-10-CM

## 2021-06-30 DIAGNOSIS — M79605 Pain in left leg: Secondary | ICD-10-CM

## 2021-06-30 DIAGNOSIS — M25662 Stiffness of left knee, not elsewhere classified: Secondary | ICD-10-CM | POA: Diagnosis not present

## 2021-06-30 DIAGNOSIS — M6281 Muscle weakness (generalized): Secondary | ICD-10-CM

## 2021-06-30 DIAGNOSIS — R262 Difficulty in walking, not elsewhere classified: Secondary | ICD-10-CM

## 2021-06-30 DIAGNOSIS — I890 Lymphedema, not elsewhere classified: Secondary | ICD-10-CM

## 2021-06-30 NOTE — Therapy (Signed)
[facility] @ [place], [place], [address] Phone: [phone]   Fax:  [phone]  Physical Therapy Treatment  Patient Details  Name: Joseph Werner MRN: [id number] Date of Birth: [birth year] Referring Provider (PT): Shona Simpson   Encounter Date: 06/30/2021   PT End of Session - 06/30/21 1456     Visit Number 22    Number of Visits 32    Date for PT Re-Evaluation 08/02/21    PT Start Time 1404    PT Stop Time 9470    PT Time Calculation (min) 49 min    Activity Tolerance Patient tolerated treatment well    Behavior During Therapy [facility] for tasks assessed/performed             Past Medical History:  Diagnosis Date   Anemia    macrocytic anemia   Arthritis    Bradycardia    chronic   CAD (coronary artery disease)    Dyslipidemia    Eczema    Hyperlipidemia    Hypothyroid    Loss of weight    Macrocytosis    with normal bone marrow biopsy in 2006 may be benign varient, evaluated in the pastby hematologistDr. Benay Spice   Osteopenia    on bone density test from orthopedist Dr. Nelva Bush, bone density test in July 2010 showed T score-1.1 in the lumbar spine and 2.1 in the femur. Bone density in August 2012 showed T score 2.3 in the femur and 2.2 in the forearm   Postsurgical aortocoronary bypass status    Prostate cancer [facility])    H/O   Prostate cancer [facility])    followed by urology Dr. Diona Fanti    Past Surgical History:  Procedure Laterality Date   [place] Right 11/18/2012   Procedure: HERNIA REPAIR INGUINAL ADULT;  Surgeon: Merrie Roof, MD;  Location: [place];  Service: General;  Laterality: Right;   INSERTION OF MESH Right 11/18/2012   Procedure: INSERTION OF MESH;  Surgeon: Merrie Roof, MD;  Location: [place];  Service: General;  Laterality: Right;   PROSTATECTOMY      There were  no vitals filed for this visit.   Subjective Assessment - 06/30/21 1407     Subjective I did not have any pain after the session yesterday.    Pertinent History L thigh sarcoma, currently undergoing radiation, underwent sarcoma removal surgery on 04/11/21, heart bypass surgery, gallbladder removed, prostate cancer    Patient Stated Goals to wake up one morning without any pain and be able to walk across the room like nothing happened    Currently in Pain? No/denies    Pain Score 0-No pain                               [facility] Adult PT Treatment/Exercise - 06/30/21 0001       Knee/Hip Exercises: Stretches   Active Hamstring Stretch 2 reps;Left;60 seconds   with pt doing it with sheet around foot   Passive Hamstring Stretch Left;2 reps;60 seconds   with therapist holding LE   Quad Stretch 3 reps;Left;60 seconds   in prone done manually   Other Knee/Hip Stretches pt seated edge of mat with knee at edge using opposite foot to pull left foot  back and bend L knee x 2 reps x 60 sec holds      Knee/Hip Exercises: Aerobic   Nustep Level 1 x 10 min to help limber up left knee and increase ROM with arms at 12      Knee/Hip Exercises: Standing   Knee Flexion AROM;Left;1 set;10 reps    Knee Flexion Limitations pt with decreased ROM with this exercise    Forward Lunges 5 reps;3 seconds;Limitations    Forward Lunges Limitations pt requires use of handrail for balance      Knee/Hip Exercises: Supine   Quad Sets Strengthening;Left;1 set;10 reps   10 sec hold; with towel rolled up under ankle   Heel Slides Strengthening;Left;1 set;10 reps   with 10 sec hold   Other Supine Knee/Hip Exercises heel slide with knee flexion stretch with sheet around ball of foot and pt pulling foot towards him x 3 reps with 60 sec holds                          PT Long Term Goals - 06/21/21 1600       PT LONG TERM GOAL #1   Title Pt and/or spouse will be independent in self MLD for  long term management of lymphedema.    Baseline Pt needs to have re-eval after surgery to determine long term managemnt    Time 8    Period Weeks    Status On-going      PT LONG TERM GOAL #2   Title Pt will obtain appropriate compression garments for long term management of lymphedema.    Time 8    Period Weeks    Status On-going      PT LONG TERM GOAL #3   Title Pt will demonstrate a 3 cm decrease in circumference at 20 cm proximal to L suprapatella to decrease risk of infection.    Baseline 63 cm at baseline, 65 cm on 7/8.2022 ( measured over sarcoma site), 55 cm    Time 8    Period Weeks    Status Achieved      PT LONG TERM GOAL #4   Title Pt will report a 50% improvement in overall pain in LLE to allow improved comfort.    Baseline He says that the pain has basically gone away 03/04/2021    Time 8    Period Weeks    Status Achieved      PT LONG TERM GOAL #5   Title Pt will demonstrate 120 degrees of bilateral knee flexion to allow pt to stand from a chair    Baseline 56 on L and 95 on R    Time 6    Period Weeks    Status New    Target Date 08/02/21      Additional Long Term Goals   Additional Long Term Goals Yes      PT LONG TERM GOAL #6   Title Pt will demonstrate ability to sit to stand from a chair without use of UEs    Baseline unable    Time 6    Period Weeks    Status New    Target Date 08/02/21      PT LONG TERM GOAL #7   Title Pt will be independent in a home exercise program for continued strengthening and stretching    Time 6    Period Weeks    Status New    Target Date 08/02/21  PT LONG TERM GOAL #8   Title Pt will be able to ambulate without need for a front wheeled walker to decrease reliance on assistive device    Time 6    Period Weeks    Status New    Target Date 08/02/21                   Plan - 06/30/21 1459     Clinical Impression Statement Used NuStep today for 10 min at beginning of session to warm up knee prior to  exercises and stretches today. Continued with LE strengthening and knee ROM exercises today. Added 2 more exercises to pt's HEP to focus on stretches to improve knee ROM. Pt able to tolerate stretching done by therapist in to knee flexion in prone and supine and also hamstring stretching. Pt is very tight in hip flexors, knee flexors and extensors. He reports he did not have any increased pain after last session which was yesterday. Will continue to work on L knee ROM, strengthening and balance.    PT Frequency 2x / week    PT Duration 6 weeks    PT Treatment/Interventions ADLs/Self Care Home Management;Therapeutic exercise;Therapeutic activities;Patient/family education;Manual techniques;Manual lymph drainage;Compression bandaging;Taping;Vasopneumatic Device;Gait training;Stair training;Passive range of motion;Scar mobilization;Joint Manipulations    PT Next Visit Plan ROM of bilateral knees with focus on L knee, strengthening bilateral LEs, decreasing use of walker, measure for bilat LE stockings once LLE is healed, hamstring and quad stretches    PT Home Exercise Plan Access Code: [email or web address]    Consulted and Agree with Plan of Care Patient             Patient will benefit from skilled therapeutic intervention in order to improve the following deficits and impairments:  Pain, Postural dysfunction, Increased fascial restricitons, Decreased strength, Decreased range of motion, Decreased scar mobility, Increased edema, Difficulty walking, Decreased skin integrity, Decreased balance  Visit Diagnosis: Decreased ROM of left knee  Decreased ROM of right knee  Muscle weakness (generalized)  Difficulty in walking, not elsewhere classified  Lymphedema, not elsewhere classified  Pain in left leg     Problem List Patient Active Problem List   Diagnosis Date Noted   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

## 2021-07-06 ENCOUNTER — Ambulatory Visit: Payer: Medicare Other | Admitting: Physical Therapy

## 2021-07-06 ENCOUNTER — Other Ambulatory Visit: Payer: Self-pay

## 2021-07-06 ENCOUNTER — Encounter: Payer: Self-pay | Admitting: Physical Therapy

## 2021-07-06 DIAGNOSIS — I890 Lymphedema, not elsewhere classified: Secondary | ICD-10-CM

## 2021-07-06 DIAGNOSIS — M6281 Muscle weakness (generalized): Secondary | ICD-10-CM

## 2021-07-06 DIAGNOSIS — M25662 Stiffness of left knee, not elsewhere classified: Secondary | ICD-10-CM | POA: Diagnosis not present

## 2021-07-06 DIAGNOSIS — M79605 Pain in left leg: Secondary | ICD-10-CM

## 2021-07-06 DIAGNOSIS — R262 Difficulty in walking, not elsewhere classified: Secondary | ICD-10-CM

## 2021-07-06 DIAGNOSIS — M25661 Stiffness of right knee, not elsewhere classified: Secondary | ICD-10-CM

## 2021-07-06 NOTE — Therapy (Signed)
[facility] @ [place], [place], [address] Phone: [phone]   Fax:  [phone]  Physical Therapy Treatment  Patient Details  Name: Joseph Werner MRN: [id number] Date of Birth: 01/08/1935 Referring Provider (PT): Shona Simpson   Encounter Date: 07/06/2021   PT End of Session - 07/06/21 1455     Visit Number 23    Number of Visits 32    Date for PT Re-Evaluation 08/02/21    PT Start Time 1406    PT Stop Time 9470    PT Time Calculation (min) 48 min    Activity Tolerance Patient tolerated treatment well    Behavior During Therapy [facility] for tasks assessed/performed             Past Medical History:  Diagnosis Date   Anemia    macrocytic anemia   Arthritis    Bradycardia    chronic   CAD (coronary artery disease)    Dyslipidemia    Eczema    Hyperlipidemia    Hypothyroid    Loss of weight    Macrocytosis    with normal bone marrow biopsy in 2006 may be benign varient, evaluated in the pastby hematologistDr. Benay Spice   Osteopenia    on bone density test from orthopedist Dr. Nelva Bush, bone density test in July 2010 showed T score-1.1 in the lumbar spine and 2.1 in the femur. Bone density in August 2012 showed T score 2.3 in the femur and 2.2 in the forearm   Postsurgical aortocoronary bypass status    Prostate cancer [facility])    H/O   Prostate cancer [facility])    followed by urology Dr. Diona Fanti    Past Surgical History:  Procedure Laterality Date   [place] Right 11/18/2012   Procedure: HERNIA REPAIR INGUINAL ADULT;  Surgeon: Merrie Roof, MD;  Location: [place];  Service: General;  Laterality: Right;   INSERTION OF MESH Right 11/18/2012   Procedure: INSERTION OF MESH;  Surgeon: Merrie Roof, MD;  Location: [place];  Service: General;  Laterality: Right;   PROSTATECTOMY      There were  no vitals filed for this visit.   Subjective Assessment - 07/06/21 1411     Subjective I did not have any pain after last session. I have been doing my exercises.    Pertinent History L thigh sarcoma, currently undergoing radiation, underwent sarcoma removal surgery on 04/11/21, heart bypass surgery, gallbladder removed, prostate cancer    Patient Stated Goals to wake up one morning without any pain and be able to walk across the room like nothing happened    Currently in Pain? No/denies    Pain Score 0-No pain                               [facility] Adult PT Treatment/Exercise - 07/06/21 0001       Transfers   Comments Assisted pt with practicing transfers in to the car today. Pt has been unable to ride in the front seat and has been sitting in the back seat. Educated how to push door all the way open and slide back in to the passenger seat. Pt able to do this as long as door is opened all the way.  Knee/Hip Exercises: Stretches   Active Hamstring Stretch 2 reps;Left;60 seconds   with pt doing it with sheet around foot   Passive Hamstring Stretch Left;2 reps;60 seconds   with therapist holding LE   Quad Stretch 3 reps;Left;60 seconds   in prone done manually   Other Knee/Hip Stretches pt seated edge of mat with knee at edge using opposite foot to pull left foot back and bend L knee x 2 reps x 60 sec holds      Knee/Hip Exercises: Aerobic   Nustep Level 1 x 10 min to help limber up left knee and increase ROM with arms at 12      Knee/Hip Exercises: Supine   Quad Sets Strengthening;Left;1 set;10 reps   10 sec hold; with towel rolled up under ankle   Heel Slides Strengthening;Left;1 set;10 reps   with 10 sec hold   Other Supine Knee/Hip Exercises heel slide with knee flexion stretch with sheet around ball of foot and pt pulling foot towards him x 3 reps with 60 sec holds                          PT Long Term Goals - 06/21/21 1600       PT LONG  TERM GOAL #1   Title Pt and/or spouse will be independent in self MLD for long term management of lymphedema.    Baseline Pt needs to have re-eval after surgery to determine long term managemnt    Time 8    Period Weeks    Status On-going      PT LONG TERM GOAL #2   Title Pt will obtain appropriate compression garments for long term management of lymphedema.    Time 8    Period Weeks    Status On-going      PT LONG TERM GOAL #3   Title Pt will demonstrate a 3 cm decrease in circumference at 20 cm proximal to L suprapatella to decrease risk of infection.    Baseline 63 cm at baseline, 65 cm on 7/8.2022 ( measured over sarcoma site), 55 cm    Time 8    Period Weeks    Status Achieved      PT LONG TERM GOAL #4   Title Pt will report a 50% improvement in overall pain in LLE to allow improved comfort.    Baseline He says that the pain has basically gone away 03/04/2021    Time 8    Period Weeks    Status Achieved      PT LONG TERM GOAL #5   Title Pt will demonstrate 120 degrees of bilateral knee flexion to allow pt to stand from a chair    Baseline 56 on L and 95 on R    Time 6    Period Weeks    Status New    Target Date 08/02/21      Additional Long Term Goals   Additional Long Term Goals Yes      PT LONG TERM GOAL #6   Title Pt will demonstrate ability to sit to stand from a chair without use of UEs    Baseline unable    Time 6    Period Weeks    Status New    Target Date 08/02/21      PT LONG TERM GOAL #7   Title Pt will be independent in a home exercise program for continued strengthening and stretching  Time 6    Period Weeks    Status New    Target Date 08/02/21      PT LONG TERM GOAL #8   Title Pt will be able to ambulate without need for a front wheeled walker to decrease reliance on assistive device    Time 6    Period Weeks    Status New    Target Date 08/02/21                   Plan - 07/06/21 1456     Clinical Impression Statement Pt is  demonstrating improved L knee flexion. He is walking better without an assistive device though he is still using a cane for community ambulation. Spent time today working on car transfers since pt has been unable to ride in the front seat of the car due to decrease L knee flexion. Continued with passive and active stretches as well as LE strengthening today. Pt has been very compliant with his home exercise program and has not been having any knee pain.    PT Frequency 2x / week    PT Duration 6 weeks    PT Treatment/Interventions ADLs/Self Care Home Management;Therapeutic exercise;Therapeutic activities;Patient/family education;Manual techniques;Manual lymph drainage;Compression bandaging;Taping;Vasopneumatic Device;Gait training;Stair training;Passive range of motion;Scar mobilization;Joint Manipulations    PT Next Visit Plan ROM of bilateral knees with focus on L knee, strengthening bilateral LEs, decreasing use of walker, measure for bilat LE stockings once LLE is healed, hamstring and quad stretches    PT Home Exercise Plan Access Code: [email or web address]    Consulted and Agree with Plan of Care Patient;Family member/caregiver    Family Member Consulted wife             Patient will benefit from skilled therapeutic intervention in order to improve the following deficits and impairments:  Pain, Postural dysfunction, Increased fascial restricitons, Decreased strength, Decreased range of motion, Decreased scar mobility, Increased edema, Difficulty walking, Decreased skin integrity, Decreased balance  Visit Diagnosis: Decreased ROM of left knee  Decreased ROM of right knee  Muscle weakness (generalized)  Difficulty in walking, not elsewhere classified  Lymphedema, not elsewhere classified  Pain in left leg     Problem List Patient Active Problem List   Diagnosis Date Noted   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

## 2021-07-07 ENCOUNTER — Encounter: Payer: Self-pay | Admitting: Physical Therapy

## 2021-07-07 ENCOUNTER — Ambulatory Visit: Payer: Medicare Other | Admitting: Physical Therapy

## 2021-07-07 DIAGNOSIS — M25662 Stiffness of left knee, not elsewhere classified: Secondary | ICD-10-CM | POA: Diagnosis not present

## 2021-07-07 DIAGNOSIS — R262 Difficulty in walking, not elsewhere classified: Secondary | ICD-10-CM

## 2021-07-07 DIAGNOSIS — M25661 Stiffness of right knee, not elsewhere classified: Secondary | ICD-10-CM

## 2021-07-07 DIAGNOSIS — I890 Lymphedema, not elsewhere classified: Secondary | ICD-10-CM

## 2021-07-07 DIAGNOSIS — M6281 Muscle weakness (generalized): Secondary | ICD-10-CM

## 2021-07-07 NOTE — Therapy (Signed)
[place] @ [place], [place], [address] Phone: [phone]   Fax:  [phone]  Physical Therapy Treatment  Patient Details  Name: Joseph Werner MRN: [id number] Date of Birth: [birth year] Referring Provider (PT): Shona Simpson   Encounter Date: 07/07/2021   PT End of Session - 07/07/21 1556     Visit Number 24    Number of Visits 32    Date for PT Re-Evaluation 08/02/21    PT Start Time 1508    PT Stop Time 1551    PT Time Calculation (min) 43 min    Activity Tolerance Patient tolerated treatment well    Behavior During Therapy [facility] for tasks assessed/performed             Past Medical History:  Diagnosis Date   Anemia    macrocytic anemia   Arthritis    Bradycardia    chronic   CAD (coronary artery disease)    Dyslipidemia    Eczema    Hyperlipidemia    Hypothyroid    Loss of weight    Macrocytosis    with normal bone marrow biopsy in 2006 may be benign varient, evaluated in the pastby hematologistDr. Benay Spice   Osteopenia    on bone density test from orthopedist Dr. Nelva Bush, bone density test in July 2010 showed T score-1.1 in the lumbar spine and 2.1 in the femur. Bone density in August 2012 showed T score 2.3 in the femur and 2.2 in the forearm   Postsurgical aortocoronary bypass status    Prostate cancer [facility])    H/O   Prostate cancer [facility])    followed by urology Dr. Diona Fanti    Past Surgical History:  Procedure Laterality Date   [place] Right 11/18/2012   Procedure: HERNIA REPAIR INGUINAL ADULT;  Surgeon: Merrie Roof, MD;  Location: [place];  Service: General;  Laterality: Right;   INSERTION OF MESH Right 11/18/2012   Procedure: INSERTION OF MESH;  Surgeon: Merrie Roof, MD;  Location: [place];  Service: General;  Laterality: Right;   PROSTATECTOMY      There were  no vitals filed for this visit.   Subjective Assessment - 07/07/21 1510     Subjective I was able to ride in the passenger seat today.    Pertinent History L thigh sarcoma, currently undergoing radiation, underwent sarcoma removal surgery on 04/11/21, heart bypass surgery, gallbladder removed, prostate cancer    Patient Stated Goals to wake up one morning without any pain and be able to walk across the room like nothing happened    Currently in Pain? No/denies    Pain Score 0-No pain                [facility] PT Assessment - 07/07/21 0001       AROM   Left Knee Extension -17    Left Knee Flexion 74                           [facility] Adult PT Treatment/Exercise - 07/07/21 0001       Knee/Hip Exercises: Stretches   Active Hamstring Stretch 2 reps;Left;60 seconds   with pt doing it with sheet around foot   Passive Hamstring Stretch Left;2 reps;60 seconds   with  therapist holding LE   Quad Stretch 3 reps;Left;60 seconds   in prone done manually   Other Knee/Hip Stretches runners stretch at wall x 60 sec hold      Knee/Hip Exercises: Aerobic   Nustep Level 1 x 10 min to help limber up left knee and increase ROM with arms at 12      Knee/Hip Exercises: Standing   Knee Flexion AROM;Left;1 set;10 reps    Knee Flexion Limitations pt with decreased ROM with this exercise    Forward Lunges 3 seconds;Limitations;10 reps    Forward Lunges Limitations pt only required occasional use of handrails in //bars    Side Lunges 1 set;10 reps   required occasional HHA   Other Standing Knee Exercises mini squats with 2 HHA in //bars with v/c to keep knees from going over toes    Other Standing Knee Exercises hamstring curls x 10 reps      Knee/Hip Exercises: Supine   Quad Sets Strengthening;Left;1 set;10 reps   10 sec hold; with towel rolled up under ankle   Heel Slides Strengthening;Left;1 set;10 reps   with 10 sec hold   Straight Leg Raises Strengthening;Left;1 set;10 reps       Knee/Hip Exercises: Prone   Contract/Relax to Increase Flexion with 1 min hold                          PT Long Term Goals - 06/21/21 1600       PT LONG TERM GOAL #1   Title Pt and/or spouse will be independent in self MLD for long term management of lymphedema.    Baseline Pt needs to have re-eval after surgery to determine long term managemnt    Time 8    Period Weeks    Status On-going      PT LONG TERM GOAL #2   Title Pt will obtain appropriate compression garments for long term management of lymphedema.    Time 8    Period Weeks    Status On-going      PT LONG TERM GOAL #3   Title Pt will demonstrate a 3 cm decrease in circumference at 20 cm proximal to L suprapatella to decrease risk of infection.    Baseline 63 cm at baseline, 65 cm on 7/8.2022 ( measured over sarcoma site), 55 cm    Time 8    Period Weeks    Status Achieved      PT LONG TERM GOAL #4   Title Pt will report a 50% improvement in overall pain in LLE to allow improved comfort.    Baseline He says that the pain has basically gone away 03/04/2021    Time 8    Period Weeks    Status Achieved      PT LONG TERM GOAL #5   Title Pt will demonstrate 120 degrees of bilateral knee flexion to allow pt to stand from a chair    Baseline 56 on L and 95 on R    Time 6    Period Weeks    Status New    Target Date 08/02/21      Additional Long Term Goals   Additional Long Term Goals Yes      PT LONG TERM GOAL #6   Title Pt will demonstrate ability to sit to stand from a chair without use of UEs    Baseline unable    Time 6    Period Weeks  Status New    Target Date 08/02/21      PT LONG TERM GOAL #7   Title Pt will be independent in a home exercise program for continued strengthening and stretching    Time 6    Period Weeks    Status New    Target Date 08/02/21      PT LONG TERM GOAL #8   Title Pt will be able to ambulate without need for a front wheeled walker to decrease reliance on  assistive device    Time 6    Period Weeks    Status New    Target Date 08/02/21                   Plan - 07/07/21 1557     Clinical Impression Statement Remeasured his knee ROM at beginning of session and pt demonstrates 4 degrees improvement in knee flexion and 1 degree improvement in to extension. Pt has been very compliant with his exercises at home. He was able to ride in the passenger seat to his appointment today which was a first. Pt did well with weight shifting exercises in the //bars. Began contract relax in prone to improve knee flexion.    PT Frequency 2x / week    PT Duration 6 weeks    PT Treatment/Interventions ADLs/Self Care Home Management;Therapeutic exercise;Therapeutic activities;Patient/family education;Manual techniques;Manual lymph drainage;Compression bandaging;Taping;Vasopneumatic Device;Gait training;Stair training;Passive range of motion;Scar mobilization;Joint Manipulations    PT Next Visit Plan ROM of bilateral knees with focus on L knee, strengthening bilateral LEs, decreasing use of walker, measure for bilat LE stockings once LLE is healed, hamstring and quad stretches    PT Home Exercise Plan Access Code: [email or web address]    Consulted and Agree with Plan of Care Patient;Family member/caregiver             Patient will benefit from skilled therapeutic intervention in order to improve the following deficits and impairments:  Pain, Postural dysfunction, Increased fascial restricitons, Decreased strength, Decreased range of motion, Decreased scar mobility, Increased edema, Difficulty walking, Decreased skin integrity, Decreased balance  Visit Diagnosis: Decreased ROM of left knee  Decreased ROM of right knee  Muscle weakness (generalized)  Difficulty in walking, not elsewhere classified  Lymphedema, not elsewhere classified     Problem List Patient Active Problem List   Diagnosis Date Noted   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

## 2021-07-12 ENCOUNTER — Encounter: Payer: Self-pay | Admitting: Rehabilitation

## 2021-07-12 ENCOUNTER — Ambulatory Visit: Payer: Medicare Other | Admitting: Rehabilitation

## 2021-07-12 ENCOUNTER — Other Ambulatory Visit: Payer: Self-pay

## 2021-07-12 DIAGNOSIS — R262 Difficulty in walking, not elsewhere classified: Secondary | ICD-10-CM

## 2021-07-12 DIAGNOSIS — M25661 Stiffness of right knee, not elsewhere classified: Secondary | ICD-10-CM

## 2021-07-12 DIAGNOSIS — M6281 Muscle weakness (generalized): Secondary | ICD-10-CM

## 2021-07-12 DIAGNOSIS — M25662 Stiffness of left knee, not elsewhere classified: Secondary | ICD-10-CM | POA: Diagnosis not present

## 2021-07-12 NOTE — Therapy (Signed)
[place] @ [place], [place], [address] Phone: [phone]   Fax:  [phone]  Physical Therapy Treatment  Patient Details  Name: Joseph Werner MRN: [id number] Date of Birth: 03-Mar-1935 Referring Provider (PT): Shona Simpson   Encounter Date: 07/12/2021   PT End of Session - 07/12/21 1455     Visit Number 25    Number of Visits 32    Date for PT Re-Evaluation 08/02/21    PT Start Time 1400    PT Stop Time 1829    PT Time Calculation (min) 45 min    Activity Tolerance Patient tolerated treatment well    Behavior During Therapy [facility] for tasks assessed/performed             Past Medical History:  Diagnosis Date   Anemia    macrocytic anemia   Arthritis    Bradycardia    chronic   CAD (coronary artery disease)    Dyslipidemia    Eczema    Hyperlipidemia    Hypothyroid    Loss of weight    Macrocytosis    with normal bone marrow biopsy in 2006 may be benign varient, evaluated in the pastby hematologistDr. Benay Spice   Osteopenia    on bone density test from orthopedist Dr. Nelva Bush, bone density test in July 2010 showed T score-1.1 in the lumbar spine and 2.1 in the femur. Bone density in August 2012 showed T score 2.3 in the femur and 2.2 in the forearm   Postsurgical aortocoronary bypass status    Prostate cancer [facility])    H/O   Prostate cancer [facility])    followed by urology Dr. Diona Fanti    Past Surgical History:  Procedure Laterality Date   [place] Right 11/18/2012   Procedure: HERNIA REPAIR INGUINAL ADULT;  Surgeon: Merrie Roof, MD;  Location: [place];  Service: General;  Laterality: Right;   INSERTION OF MESH Right 11/18/2012   Procedure: INSERTION OF MESH;  Surgeon: Merrie Roof, MD;  Location: [place];  Service: General;  Laterality: Right;   PROSTATECTOMY      There were  no vitals filed for this visit.   Subjective Assessment - 07/12/21 1453     Subjective I blew the leaves and rode in the front seat today    Pertinent History L thigh sarcoma, currently undergoing radiation, underwent sarcoma removal surgery on 04/11/21, heart bypass surgery, gallbladder removed, prostate cancer    Currently in Pain? No/denies                               [facility] Adult PT Treatment/Exercise - 07/12/21 0001       Knee/Hip Exercises: Stretches   Other Knee/Hip Stretches runners gastroc stretch x 60"      Knee/Hip Exercises: Aerobic   Nustep Level 1 x 10 min to for ROM with arms at 12      Knee/Hip Exercises: Standing   Heel Raises Both;10 reps    Knee Flexion Both;10 reps    Knee Flexion Limitations alternating to work on standing balance    Other Standing Knee Exercises mini squats with 2 HHA in //bars with v/c to keep knees from going over toes x 10, hamstring curls x 10 alternating  Other Standing Knee Exercises pregait steps forward bil x 5 doing very well with this      Knee/Hip Exercises: Seated   Heel Slides Limitations 60" x 2 AAROM      Knee/Hip Exercises: Supine   Heel Slides Left;5 reps    Heel Slides Limitations 6" hold with stretch strap for AAROM    Terminal Knee Extension Left;10 reps    Terminal Knee Extension Limitations 5" press into the mat    Straight Leg Raises Left;2 sets;5 reps    Straight Leg Raises Limitations with vcs on keeping quad activated      Manual Therapy   Passive ROM to L knee in to flexion and to hip in to flexion to stretch quads and hamstrings                          PT Long Term Goals - 06/21/21 1600       PT LONG TERM GOAL #1   Title Pt and/or spouse will be independent in self MLD for long term management of lymphedema.    Baseline Pt needs to have re-eval after surgery to determine long term managemnt    Time 8    Period Weeks    Status On-going      PT LONG TERM GOAL #2    Title Pt will obtain appropriate compression garments for long term management of lymphedema.    Time 8    Period Weeks    Status On-going      PT LONG TERM GOAL #3   Title Pt will demonstrate a 3 cm decrease in circumference at 20 cm proximal to L suprapatella to decrease risk of infection.    Baseline 63 cm at baseline, 65 cm on 7/8.2022 ( measured over sarcoma site), 55 cm    Time 8    Period Weeks    Status Achieved      PT LONG TERM GOAL #4   Title Pt will report a 50% improvement in overall pain in LLE to allow improved comfort.    Baseline He says that the pain has basically gone away 03/04/2021    Time 8    Period Weeks    Status Achieved      PT LONG TERM GOAL #5   Title Pt will demonstrate 120 degrees of bilateral knee flexion to allow pt to stand from a chair    Baseline 56 on L and 95 on R    Time 6    Period Weeks    Status New    Target Date 08/02/21      Additional Long Term Goals   Additional Long Term Goals Yes      PT LONG TERM GOAL #6   Title Pt will demonstrate ability to sit to stand from a chair without use of UEs    Baseline unable    Time 6    Period Weeks    Status New    Target Date 08/02/21      PT LONG TERM GOAL #7   Title Pt will be independent in a home exercise program for continued strengthening and stretching    Time 6    Period Weeks    Status New    Target Date 08/02/21      PT LONG TERM GOAL #8   Title Pt will be able to ambulate without need for a front wheeled walker to decrease reliance on assistive device  Time 6    Period Weeks    Status New    Target Date 08/02/21                   Plan - 07/12/21 1455     Clinical Impression Statement Limited mainly with hip flexion and knee flexion PROM/tightness.  Attempted to work on knee PROM into flexion supine but limited with availability of hip flexion PROM.  Is doing well with ADLs and gait with very minimal signs of a limp.    PT Frequency 2x / week    PT  Duration 6 weeks    PT Treatment/Interventions ADLs/Self Care Home Management;Therapeutic exercise;Therapeutic activities;Patient/family education;Manual techniques;Manual lymph drainage;Compression bandaging;Taping;Vasopneumatic Device;Gait training;Stair training;Passive range of motion;Scar mobilization;Joint Manipulations    PT Next Visit Plan ROM of bilateral knees with focus on L knee, strengthening bilateral LEs, decreasing use of walker, measure for bilat LE stockings once LLE is healed, hamstring and quad stretches             Patient will benefit from skilled therapeutic intervention in order to improve the following deficits and impairments:     Visit Diagnosis: Decreased ROM of left knee  Decreased ROM of right knee  Difficulty in walking, not elsewhere classified  Muscle weakness (generalized)     Problem List Patient Active Problem List   Diagnosis Date Noted   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

## 2021-07-14 ENCOUNTER — Ambulatory Visit: Payer: Medicare Other | Admitting: Rehabilitation

## 2021-07-14 ENCOUNTER — Encounter: Payer: Self-pay | Admitting: Rehabilitation

## 2021-07-14 ENCOUNTER — Other Ambulatory Visit: Payer: Self-pay

## 2021-07-14 DIAGNOSIS — M25662 Stiffness of left knee, not elsewhere classified: Secondary | ICD-10-CM | POA: Diagnosis not present

## 2021-07-14 DIAGNOSIS — R262 Difficulty in walking, not elsewhere classified: Secondary | ICD-10-CM

## 2021-07-14 DIAGNOSIS — I890 Lymphedema, not elsewhere classified: Secondary | ICD-10-CM

## 2021-07-14 DIAGNOSIS — M25661 Stiffness of right knee, not elsewhere classified: Secondary | ICD-10-CM

## 2021-07-14 DIAGNOSIS — M6281 Muscle weakness (generalized): Secondary | ICD-10-CM

## 2021-07-14 NOTE — Therapy (Signed)
[place] @ [place], [place], [address] Phone: [phone]   Fax:  [phone]  Physical Therapy Treatment  Patient Details  Name: Joseph Werner MRN: [id number] Date of Birth: 1935-04-17 Referring Provider (PT): Shona Simpson   Encounter Date: 07/14/2021   PT End of Session - 07/14/21 1205     Visit Number 26    Number of Visits 32    Date for PT Re-Evaluation 08/02/21    PT Start Time 1100    PT Stop Time 1884    PT Time Calculation (min) 56 min    Activity Tolerance Patient tolerated treatment well    Behavior During Therapy [facility] for tasks assessed/performed             Past Medical History:  Diagnosis Date   Anemia    macrocytic anemia   Arthritis    Bradycardia    chronic   CAD (coronary artery disease)    Dyslipidemia    Eczema    Hyperlipidemia    Hypothyroid    Loss of weight    Macrocytosis    with normal bone marrow biopsy in 2006 may be benign varient, evaluated in the pastby hematologistDr. Benay Spice   Osteopenia    on bone density test from orthopedist Dr. Nelva Bush, bone density test in July 2010 showed T score-1.1 in the lumbar spine and 2.1 in the femur. Bone density in August 2012 showed T score 2.3 in the femur and 2.2 in the forearm   Postsurgical aortocoronary bypass status    Prostate cancer [facility])    H/O   Prostate cancer [facility])    followed by urology Dr. Diona Fanti    Past Surgical History:  Procedure Laterality Date   [place] Right 11/18/2012   Procedure: HERNIA REPAIR INGUINAL ADULT;  Surgeon: Merrie Roof, MD;  Location: [place];  Service: General;  Laterality: Right;   INSERTION OF MESH Right 11/18/2012   Procedure: INSERTION OF MESH;  Surgeon: Merrie Roof, MD;  Location: [place];  Service: General;  Laterality: Right;   PROSTATECTOMY      There were  no vitals filed for this visit.   Subjective Assessment - 07/14/21 1059     Subjective The MD didn't really say anything new. I can stop wrapping the leg but I am supposed to get a stocking    Pertinent History L thigh sarcoma, currently undergoing radiation, underwent sarcoma removal surgery on 04/11/21, heart bypass surgery, gallbladder removed, prostate cancer    Patient Stated Goals to wake up one morning without any pain and be able to walk across the room like nothing happened    Currently in Pain? No/denies                               [facility] Adult PT Treatment/Exercise - 07/14/21 0001       Knee/Hip Exercises: Standing   Heel Raises Both;10 reps    Knee Flexion Both;10 reps    Knee Flexion Limitations alternating to work on standing balance    Hip Flexion Both;10 reps    Hip Flexion Limitations alternating march      Knee/Hip Exercises: Supine   Short Arc Quad Sets Strengthening;Left;1 set;10 reps    Heel Slides  Left;5 reps    Heel Slides Limitations 6" hold with stretch strap for AAROM both feet propped on the ball with PT assist into more flexion    Terminal Knee Extension Left;10 reps    Terminal Knee Extension Limitations 5" press into the mat    Straight Leg Raises Left;2 sets;5 reps    Straight Leg Raises Limitations with vcs on keeping quad activated    Other Supine Knee/Hip Exercises full leg extension press into table 5" x 10      Manual Therapy   Manual therapy comments measured pt for knee high Juzo soft 20-30 as he liked this fabric the best - also talked about getting the 30-40 to see which one feels the best                          PT Long Term Goals - 06/21/21 1600       PT LONG TERM GOAL #1   Title Pt and/or spouse will be independent in self MLD for long term management of lymphedema.    Baseline Pt needs to have re-eval after surgery to determine long term managemnt    Time 8    Period Weeks    Status On-going       PT LONG TERM GOAL #2   Title Pt will obtain appropriate compression garments for long term management of lymphedema.    Time 8    Period Weeks    Status On-going      PT LONG TERM GOAL #3   Title Pt will demonstrate a 3 cm decrease in circumference at 20 cm proximal to L suprapatella to decrease risk of infection.    Baseline 63 cm at baseline, 65 cm on 7/8.2022 ( measured over sarcoma site), 55 cm    Time 8    Period Weeks    Status Achieved      PT LONG TERM GOAL #4   Title Pt will report a 50% improvement in overall pain in LLE to allow improved comfort.    Baseline He says that the pain has basically gone away 03/04/2021    Time 8    Period Weeks    Status Achieved      PT LONG TERM GOAL #5   Title Pt will demonstrate 120 degrees of bilateral knee flexion to allow pt to stand from a chair    Baseline 56 on L and 95 on R    Time 6    Period Weeks    Status New    Target Date 08/02/21      Additional Long Term Goals   Additional Long Term Goals Yes      PT LONG TERM GOAL #6   Title Pt will demonstrate ability to sit to stand from a chair without use of UEs    Baseline unable    Time 6    Period Weeks    Status New    Target Date 08/02/21      PT LONG TERM GOAL #7   Title Pt will be independent in a home exercise program for continued strengthening and stretching    Time 6    Period Weeks    Status New    Target Date 08/02/21      PT LONG TERM GOAL #8   Title Pt will be able to ambulate without need for a front wheeled walker to decrease reliance on assistive device    Time  6    Period Weeks    Status New    Target Date 08/02/21                   Plan - 07/14/21 1205     Clinical Impression Statement MD visit went well.  He has some blisters on the back of the knee most likely from wrapping so he is supposed to use aquaphor and cover them with gauze.  Pt was okayed to just get a knee high garment so he was given info for Juzo soft today.   continued POC    PT Next Visit Plan ROM of bilateral knees with focus on L knee, strengthening bilateral LEs, decreasing use of walker, measure for bilat LE stockings once LLE is healed, hamstring and quad stretches             Patient will benefit from skilled therapeutic intervention in order to improve the following deficits and impairments:     Visit Diagnosis: Decreased ROM of left knee  Decreased ROM of right knee  Difficulty in walking, not elsewhere classified  Muscle weakness (generalized)  Lymphedema, not elsewhere classified     Problem List Patient Active Problem List   Diagnosis Date Noted   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

## 2021-07-18 ENCOUNTER — Ambulatory Visit: Payer: Medicare Other | Admitting: Physical Therapy

## 2021-07-18 ENCOUNTER — Encounter: Payer: Self-pay | Admitting: Physical Therapy

## 2021-07-18 ENCOUNTER — Other Ambulatory Visit: Payer: Self-pay

## 2021-07-18 DIAGNOSIS — M6281 Muscle weakness (generalized): Secondary | ICD-10-CM

## 2021-07-18 DIAGNOSIS — M25661 Stiffness of right knee, not elsewhere classified: Secondary | ICD-10-CM

## 2021-07-18 DIAGNOSIS — R262 Difficulty in walking, not elsewhere classified: Secondary | ICD-10-CM

## 2021-07-18 DIAGNOSIS — M25662 Stiffness of left knee, not elsewhere classified: Secondary | ICD-10-CM

## 2021-07-18 NOTE — Therapy (Signed)
[place] @ [place], [place], [address] Phone: [phone]   Fax:  [phone]  Physical Therapy Treatment  Patient Details  Name: Joseph Werner MRN: [id number] Date of Birth: [birth year] Referring Provider (PT): Shona Simpson   Encounter Date: 07/18/2021   PT End of Session - 07/18/21 1156     Visit Number 27    Number of Visits 32    Date for PT Re-Evaluation 08/02/21    PT Start Time 1106    PT Stop Time 1152    PT Time Calculation (min) 46 min    Activity Tolerance Patient tolerated treatment well    Behavior During Therapy [facility] for tasks assessed/performed             Past Medical History:  Diagnosis Date   Anemia    macrocytic anemia   Arthritis    Bradycardia    chronic   CAD (coronary artery disease)    Dyslipidemia    Eczema    Hyperlipidemia    Hypothyroid    Loss of weight    Macrocytosis    with normal bone marrow biopsy in 2006 may be benign varient, evaluated in the pastby hematologistDr. Benay Spice   Osteopenia    on bone density test from orthopedist Dr. Nelva Bush, bone density test in July 2010 showed T score-1.1 in the lumbar spine and 2.1 in the femur. Bone density in August 2012 showed T score 2.3 in the femur and 2.2 in the forearm   Postsurgical aortocoronary bypass status    Prostate cancer [facility])    H/O   Prostate cancer [facility])    followed by urology Dr. Diona Fanti    Past Surgical History:  Procedure Laterality Date   [place] Right 11/18/2012   Procedure: HERNIA REPAIR INGUINAL ADULT;  Surgeon: Merrie Roof, MD;  Location: [place];  Service: General;  Laterality: Right;   INSERTION OF MESH Right 11/18/2012   Procedure: INSERTION OF MESH;  Surgeon: Merrie Roof, MD;  Location: [place];  Service: General;  Laterality: Right;   PROSTATECTOMY      There were  no vitals filed for this visit.   Subjective Assessment - 07/18/21 1106     Subjective The knee is doing ok.    Pertinent History L thigh sarcoma, currently undergoing radiation, underwent sarcoma removal surgery on 04/11/21, heart bypass surgery, gallbladder removed, prostate cancer    Patient Stated Goals to wake up one morning without any pain and be able to walk across the room like nothing happened    Currently in Pain? No/denies    Pain Score 0-No pain                               [facility] Adult PT Treatment/Exercise - 07/18/21 0001       Knee/Hip Exercises: Stretches   Passive Hamstring Stretch Left;2 reps;60 seconds      Knee/Hip Exercises: Aerobic   Nustep Level 1 x 10 min to for ROM with arms at 12      Knee/Hip Exercises: Standing   Heel Raises Both;10 reps    Knee Flexion Both;10 reps    Knee Flexion Limitations alternating to work on standing balance    Hip Flexion Both;10 reps  Hip Flexion Limitations alternating march    Other Standing Knee Exercises mini squats with v/c to keep knees from going over toes      Knee/Hip Exercises: Seated   Heel Slides Limitations 60" x 2 AAROM      Knee/Hip Exercises: Supine   Quad Sets Strengthening;Left;1 set;10 reps   10 sec hold; with towel rolled up under ankle   Short Arc Quad Sets Strengthening;Left;1 set    Short Arc Quad Sets Limitations 4 lbs    Heel Slides Strengthening;Left;1 set;10 reps   with 10 sec hold   Straight Leg Raises Strengthening;Left;1 set;10 reps    Other Supine Knee/Hip Exercises full leg extension press into table 5" x 10      Manual Therapy   Passive ROM to L knee in to flexion and to hip in to flexion to stretch quads and hamstrings                          PT Long Term Goals - 06/21/21 1600       PT LONG TERM GOAL #1   Title Pt and/or spouse will be independent in self MLD for long term management of lymphedema.    Baseline Pt needs to have re-eval after  surgery to determine long term managemnt    Time 8    Period Weeks    Status On-going      PT LONG TERM GOAL #2   Title Pt will obtain appropriate compression garments for long term management of lymphedema.    Time 8    Period Weeks    Status On-going      PT LONG TERM GOAL #3   Title Pt will demonstrate a 3 cm decrease in circumference at 20 cm proximal to L suprapatella to decrease risk of infection.    Baseline 63 cm at baseline, 65 cm on 7/8.2022 ( measured over sarcoma site), 55 cm    Time 8    Period Weeks    Status Achieved      PT LONG TERM GOAL #4   Title Pt will report a 50% improvement in overall pain in LLE to allow improved comfort.    Baseline He says that the pain has basically gone away 03/04/2021    Time 8    Period Weeks    Status Achieved      PT LONG TERM GOAL #5   Title Pt will demonstrate 120 degrees of bilateral knee flexion to allow pt to stand from a chair    Baseline 56 on L and 95 on R    Time 6    Period Weeks    Status New    Target Date 08/02/21      Additional Long Term Goals   Additional Long Term Goals Yes      PT LONG TERM GOAL #6   Title Pt will demonstrate ability to sit to stand from a chair without use of UEs    Baseline unable    Time 6    Period Weeks    Status New    Target Date 08/02/21      PT LONG TERM GOAL #7   Title Pt will be independent in a home exercise program for continued strengthening and stretching    Time 6    Period Weeks    Status New    Target Date 08/02/21      PT LONG TERM GOAL #8  Title Pt will be able to ambulate without need for a front wheeled walker to decrease reliance on assistive device    Time 6    Period Weeks    Status New    Target Date 08/02/21                   Plan - 07/18/21 1157     Clinical Impression Statement Continued with ROM and strengthening of LLE. Pt is still very limited with left hip and knee ROM. Pt has been very compliant with his home exercise program. He  ordered his compression garments and they should be here next week sometime. Will continue to focus on improving L knee flexion and extension and L hip flexion.    PT Frequency 2x / week    PT Duration 6 weeks    PT Treatment/Interventions ADLs/Self Care Home Management;Therapeutic exercise;Therapeutic activities;Patient/family education;Manual techniques;Manual lymph drainage;Compression bandaging;Taping;Vasopneumatic Device;Gait training;Stair training;Passive range of motion;Scar mobilization;Joint Manipulations    PT Next Visit Plan ROM of bilateral knees with focus on L knee, strengthening bilateral LEs, decreasing use of walker, measure for bilat LE stockings once LLE is healed, hamstring and quad stretches    Consulted and Agree with Plan of Care Patient;Family member/caregiver             Patient will benefit from skilled therapeutic intervention in order to improve the following deficits and impairments:  Pain, Postural dysfunction, Increased fascial restricitons, Decreased strength, Decreased range of motion, Decreased scar mobility, Increased edema, Difficulty walking, Decreased skin integrity, Decreased balance  Visit Diagnosis: Decreased ROM of left knee  Decreased ROM of right knee  Difficulty in walking, not elsewhere classified  Muscle weakness (generalized)     Problem List Patient Active Problem List   Diagnosis Date Noted   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

## 2021-07-19 ENCOUNTER — Ambulatory Visit: Payer: Medicare Other | Admitting: Physical Therapy

## 2021-07-19 ENCOUNTER — Encounter: Payer: Self-pay | Admitting: Physical Therapy

## 2021-07-19 DIAGNOSIS — M6281 Muscle weakness (generalized): Secondary | ICD-10-CM

## 2021-07-19 DIAGNOSIS — M25662 Stiffness of left knee, not elsewhere classified: Secondary | ICD-10-CM | POA: Diagnosis not present

## 2021-07-19 DIAGNOSIS — R262 Difficulty in walking, not elsewhere classified: Secondary | ICD-10-CM

## 2021-07-19 DIAGNOSIS — M25661 Stiffness of right knee, not elsewhere classified: Secondary | ICD-10-CM

## 2021-07-19 NOTE — Therapy (Signed)
[place] @ [place], [place], [address] Phone: [phone]   Fax:  [phone]  Physical Therapy Treatment  Patient Details  Name: Joseph Werner MRN: [id number] Date of Birth: 02-14-1935 Referring Provider (PT): Shona Simpson   Encounter Date: 07/19/2021   PT End of Session - 07/19/21 1155     Visit Number 28    Number of Visits 32    Date for PT Re-Evaluation 08/02/21    PT Start Time 4709    PT Stop Time 1150    PT Time Calculation (min) 48 min    Activity Tolerance Patient tolerated treatment well    Behavior During Therapy [facility] for tasks assessed/performed             Past Medical History:  Diagnosis Date   Anemia    macrocytic anemia   Arthritis    Bradycardia    chronic   CAD (coronary artery disease)    Dyslipidemia    Eczema    Hyperlipidemia    Hypothyroid    Loss of weight    Macrocytosis    with normal bone marrow biopsy in 2006 may be benign varient, evaluated in the pastby hematologistDr. Benay Spice   Osteopenia    on bone density test from orthopedist Dr. Nelva Bush, bone density test in July 2010 showed T score-1.1 in the lumbar spine and 2.1 in the femur. Bone density in August 2012 showed T score 2.3 in the femur and 2.2 in the forearm   Postsurgical aortocoronary bypass status    Prostate cancer [facility])    H/O   Prostate cancer [facility])    followed by urology Dr. Diona Fanti    Past Surgical History:  Procedure Laterality Date   [place] Right 11/18/2012   Procedure: HERNIA REPAIR INGUINAL ADULT;  Surgeon: Merrie Roof, MD;  Location: [place];  Service: General;  Laterality: Right;   INSERTION OF MESH Right 11/18/2012   Procedure: INSERTION OF MESH;  Surgeon: Merrie Roof, MD;  Location: [place];  Service: General;  Laterality: Right;   PROSTATECTOMY      There were  no vitals filed for this visit.   Subjective Assessment - 07/19/21 1102     Subjective My knee is doing better. I am getting more blisters.    Pertinent History L thigh sarcoma, currently undergoing radiation, underwent sarcoma removal surgery on 04/11/21, heart bypass surgery, gallbladder removed, prostate cancer    Patient Stated Goals to wake up one morning without any pain and be able to walk across the room like nothing happened    Currently in Pain? No/denies    Pain Score 0-No pain                               [facility] Adult PT Treatment/Exercise - 07/19/21 0001       Knee/Hip Exercises: Stretches   Passive Hamstring Stretch Left;2 reps;60 seconds    Quad Stretch Left;60 seconds;2 reps   in prone     Knee/Hip Exercises: Aerobic   Nustep Level 1 x 10 min to for ROM with arms at 12      Knee/Hip Exercises: Standing   Heel Raises Both;10 reps    Knee Flexion Both;10 reps  Knee Flexion Limitations alternating to work on standing balance    Hip Flexion Both;10 reps    Hip Flexion Limitations alternating march    Other Standing Knee Exercises mini squats with prolonged holds holding on to // bars with v/c to keep knees from going over toes    Other Standing Knee Exercises alternating toe tapping on 2 in step      Knee/Hip Exercises: Seated   Heel Slides Limitations 60" x 2 AAROM      Knee/Hip Exercises: Supine   Quad Sets Strengthening;Left;1 set;10 reps   10 sec hold; with towel rolled up under ankle   Short Arc Quad Sets Strengthening;Left;1 set    Short Arc Quad Sets Limitations 4 lbs    Heel Slides Strengthening;Left;1 set;10 reps   with 10 sec hold   Straight Leg Raises Strengthening;Left;1 set;10 reps    Other Supine Knee/Hip Exercises full leg extension press into table 5" x 10      Manual Therapy   Passive ROM to L knee in to flexion and to hip in to flexion to stretch quads and hamstrings                          PT Long Term  Goals - 06/21/21 1600       PT LONG TERM GOAL #1   Title Pt and/or spouse will be independent in self MLD for long term management of lymphedema.    Baseline Pt needs to have re-eval after surgery to determine long term managemnt    Time 8    Period Weeks    Status On-going      PT LONG TERM GOAL #2   Title Pt will obtain appropriate compression garments for long term management of lymphedema.    Time 8    Period Weeks    Status On-going      PT LONG TERM GOAL #3   Title Pt will demonstrate a 3 cm decrease in circumference at 20 cm proximal to L suprapatella to decrease risk of infection.    Baseline 63 cm at baseline, 65 cm on 7/8.2022 ( measured over sarcoma site), 55 cm    Time 8    Period Weeks    Status Achieved      PT LONG TERM GOAL #4   Title Pt will report a 50% improvement in overall pain in LLE to allow improved comfort.    Baseline He says that the pain has basically gone away 03/04/2021    Time 8    Period Weeks    Status Achieved      PT LONG TERM GOAL #5   Title Pt will demonstrate 120 degrees of bilateral knee flexion to allow pt to stand from a chair    Baseline 56 on L and 95 on R    Time 6    Period Weeks    Status New    Target Date 08/02/21      Additional Long Term Goals   Additional Long Term Goals Yes      PT LONG TERM GOAL #6   Title Pt will demonstrate ability to sit to stand from a chair without use of UEs    Baseline unable    Time 6    Period Weeks    Status New    Target Date 08/02/21      PT LONG TERM GOAL #7   Title Pt will be independent in a  home exercise program for continued strengthening and stretching    Time 6    Period Weeks    Status New    Target Date 08/02/21      PT LONG TERM GOAL #8   Title Pt will be able to ambulate without need for a front wheeled walker to decrease reliance on assistive device    Time 6    Period Weeks    Status New    Target Date 08/02/21                   Plan - 07/19/21 1158      Clinical Impression Statement Continued with ROM and strengthening of LLE. Pt reports he feels his knee is moving better. Pt continues to be compliant with his home exercise program. He is still awaiting on arrival of his compression garments. Began stepping exercise today to help improve balance and weight shifting.    PT Frequency 2x / week    PT Duration 6 weeks    PT Treatment/Interventions ADLs/Self Care Home Management;Therapeutic exercise;Therapeutic activities;Patient/family education;Manual techniques;Manual lymph drainage;Compression bandaging;Taping;Vasopneumatic Device;Gait training;Stair training;Passive range of motion;Scar mobilization;Joint Manipulations    PT Next Visit Plan ROM of bilateral knees with focus on L knee, strengthening bilateral LEs, decreasing use of walker, measure for bilat LE stockings once LLE is healed, hamstring and quad stretches    Consulted and Agree with Plan of Care Patient;Family member/caregiver             Patient will benefit from skilled therapeutic intervention in order to improve the following deficits and impairments:  Pain, Postural dysfunction, Increased fascial restricitons, Decreased strength, Decreased range of motion, Decreased scar mobility, Increased edema, Difficulty walking, Decreased skin integrity, Decreased balance  Visit Diagnosis: Decreased ROM of left knee  Decreased ROM of right knee  Difficulty in walking, not elsewhere classified  Muscle weakness (generalized)     Problem List Patient Active Problem List   Diagnosis Date Noted   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

## 2021-07-26 ENCOUNTER — Ambulatory Visit: Payer: Medicare Other | Admitting: Physical Therapy

## 2021-07-26 ENCOUNTER — Encounter: Payer: Self-pay | Admitting: Physical Therapy

## 2021-07-26 ENCOUNTER — Other Ambulatory Visit: Payer: Self-pay

## 2021-07-26 DIAGNOSIS — M25662 Stiffness of left knee, not elsewhere classified: Secondary | ICD-10-CM | POA: Diagnosis not present

## 2021-07-26 DIAGNOSIS — M25661 Stiffness of right knee, not elsewhere classified: Secondary | ICD-10-CM

## 2021-07-26 DIAGNOSIS — R262 Difficulty in walking, not elsewhere classified: Secondary | ICD-10-CM

## 2021-07-26 DIAGNOSIS — M6281 Muscle weakness (generalized): Secondary | ICD-10-CM

## 2021-07-26 DIAGNOSIS — I890 Lymphedema, not elsewhere classified: Secondary | ICD-10-CM

## 2021-07-26 NOTE — Therapy (Signed)
[place] @ [place], [place], [address] Phone: [phone]   Fax:  [phone]  Physical Therapy Treatment  Patient Details  Name: Joseph Werner MRN: [id number] Date of Birth: Jun 27, 1935 Referring Provider (PT): Shona Simpson   Encounter Date: 07/26/2021   PT End of Session - 07/26/21 1357     Visit Number 29    Number of Visits 32    Date for PT Re-Evaluation 08/02/21    PT Start Time 6808    PT Stop Time 8110    PT Time Calculation (min) 53 min    Activity Tolerance Patient tolerated treatment well    Behavior During Therapy [facility] for tasks assessed/performed             Past Medical History:  Diagnosis Date   Anemia    macrocytic anemia   Arthritis    Bradycardia    chronic   CAD (coronary artery disease)    Dyslipidemia    Eczema    Hyperlipidemia    Hypothyroid    Loss of weight    Macrocytosis    with normal bone marrow biopsy in 2006 may be benign varient, evaluated in the pastby hematologistDr. Benay Spice   Osteopenia    on bone density test from orthopedist Dr. Nelva Bush, bone density test in July 2010 showed T score-1.1 in the lumbar spine and 2.1 in the femur. Bone density in August 2012 showed T score 2.3 in the femur and 2.2 in the forearm   Postsurgical aortocoronary bypass status    Prostate cancer [facility])    H/O   Prostate cancer [facility])    followed by urology Dr. Diona Fanti    Past Surgical History:  Procedure Laterality Date   [place] Right 11/18/2012   Procedure: HERNIA REPAIR INGUINAL ADULT;  Surgeon: Merrie Roof, MD;  Location: [place];  Service: General;  Laterality: Right;   INSERTION OF MESH Right 11/18/2012   Procedure: INSERTION OF MESH;  Surgeon: Merrie Roof, MD;  Location: [place];  Service: General;  Laterality: Right;   PROSTATECTOMY      There were  no vitals filed for this visit.   Subjective Assessment - 07/26/21 1305     Subjective I was able to get in the drivers seat of both cars but I can not sit that way for long because my knee is bent too much.    Pertinent History L thigh sarcoma, currently undergoing radiation, underwent sarcoma removal surgery on 04/11/21, heart bypass surgery, gallbladder removed, prostate cancer    Patient Stated Goals to wake up one morning without any pain and be able to walk across the room like nothing happened    Currently in Pain? No/denies    Pain Score 0-No pain                               [facility] Adult PT Treatment/Exercise - 07/26/21 0001       Knee/Hip Exercises: Stretches   Passive Hamstring Stretch Left;2 reps;60 seconds    Quad Stretch Left;60 seconds;2 reps   in prone     Knee/Hip Exercises: Aerobic   Nustep Level 1 x 10 min to for ROM with arms at 12      Knee/Hip  Exercises: Seated   Heel Slides Limitations 60" x 2 AAROM      Knee/Hip Exercises: Supine   Quad Sets Strengthening;Left;1 set;10 reps   10 sec hold; with towel rolled up under ankle   Short Arc Quad Sets Strengthening;Left;1 set    Short Arc Quad Sets Limitations 4lbs    Heel Slides Strengthening;Left;1 set;10 reps   with 10 sec hold   Straight Leg Raises Strengthening;Left;1 set;10 reps      Manual Therapy   Passive ROM to L knee in to flexion and to hip in to flexion to stretch quads and hamstrings                          PT Long Term Goals - 06/21/21 1600       PT LONG TERM GOAL #1   Title Pt and/or spouse will be independent in self MLD for long term management of lymphedema.    Baseline Pt needs to have re-eval after surgery to determine long term managemnt    Time 8    Period Weeks    Status On-going      PT LONG TERM GOAL #2   Title Pt will obtain appropriate compression garments for long term management of lymphedema.    Time 8    Period Weeks    Status  On-going      PT LONG TERM GOAL #3   Title Pt will demonstrate a 3 cm decrease in circumference at 20 cm proximal to L suprapatella to decrease risk of infection.    Baseline 63 cm at baseline, 65 cm on 7/8.2022 ( measured over sarcoma site), 55 cm    Time 8    Period Weeks    Status Achieved      PT LONG TERM GOAL #4   Title Pt will report a 50% improvement in overall pain in LLE to allow improved comfort.    Baseline He says that the pain has basically gone away 03/04/2021    Time 8    Period Weeks    Status Achieved      PT LONG TERM GOAL #5   Title Pt will demonstrate 120 degrees of bilateral knee flexion to allow pt to stand from a chair    Baseline 56 on L and 95 on R    Time 6    Period Weeks    Status New    Target Date 08/02/21      Additional Long Term Goals   Additional Long Term Goals Yes      PT LONG TERM GOAL #6   Title Pt will demonstrate ability to sit to stand from a chair without use of UEs    Baseline unable    Time 6    Period Weeks    Status New    Target Date 08/02/21      PT LONG TERM GOAL #7   Title Pt will be independent in a home exercise program for continued strengthening and stretching    Time 6    Period Weeks    Status New    Target Date 08/02/21      PT LONG TERM GOAL #8   Title Pt will be able to ambulate without need for a front wheeled walker to decrease reliance on assistive device    Time 6    Period Weeks    Status New    Target Date 08/02/21  Plan - 07/26/21 1357     Clinical Impression Statement Pt reports he is now able to get in the driver seat in both of his vehicles but is still unable to drive secondary to discomfort from increased knee flexion. Pt continues to be compliant in his home exercise program. Educated pt to do runners stretch at home to help decrease tightness in L hip flexors. Pt received his knee high compression stockings. He has them donned today. Educated pt to pull them down  further towards toes to help manage forefoot swelling. Also examined L leg where pt has numerous blisters. Educated pt and spouse to call the doctor and let them know that there are more blisters with some patches of redness on L leg especially around posterior knee.    PT Frequency 2x / week    PT Duration 6 weeks    PT Treatment/Interventions ADLs/Self Care Home Management;Therapeutic exercise;Therapeutic activities;Patient/family education;Manual techniques;Manual lymph drainage;Compression bandaging;Taping;Vasopneumatic Device;Gait training;Stair training;Passive range of motion;Scar mobilization;Joint Manipulations    PT Next Visit Plan ROM of bilateral knees with focus on L knee, strengthening bilateral LEs, decreasing use of walker, measure for bilat LE stockings once LLE is healed, hamstring and quad stretches    PT Home Exercise Plan Access Code: [email or web address]    Consulted and Agree with Plan of Care Patient;Family member/caregiver             Patient will benefit from skilled therapeutic intervention in order to improve the following deficits and impairments:  Pain, Postural dysfunction, Increased fascial restricitons, Decreased strength, Decreased range of motion, Decreased scar mobility, Increased edema, Difficulty walking, Decreased skin integrity, Decreased balance  Visit Diagnosis: Decreased ROM of left knee  Decreased ROM of right knee  Difficulty in walking, not elsewhere classified  Muscle weakness (generalized)  Lymphedema, not elsewhere classified     Problem List Patient Active Problem List   Diagnosis Date Noted   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

## 2021-07-28 ENCOUNTER — Encounter: Payer: Self-pay | Admitting: Physical Therapy

## 2021-07-28 ENCOUNTER — Ambulatory Visit: Payer: Medicare Other | Attending: Orthopedic Surgery | Admitting: Physical Therapy

## 2021-07-28 ENCOUNTER — Other Ambulatory Visit: Payer: Self-pay

## 2021-07-28 DIAGNOSIS — M79605 Pain in left leg: Secondary | ICD-10-CM | POA: Diagnosis present

## 2021-07-28 DIAGNOSIS — M6281 Muscle weakness (generalized): Secondary | ICD-10-CM | POA: Insufficient documentation

## 2021-07-28 DIAGNOSIS — M25661 Stiffness of right knee, not elsewhere classified: Secondary | ICD-10-CM | POA: Diagnosis present

## 2021-07-28 DIAGNOSIS — I890 Lymphedema, not elsewhere classified: Secondary | ICD-10-CM | POA: Insufficient documentation

## 2021-07-28 DIAGNOSIS — R262 Difficulty in walking, not elsewhere classified: Secondary | ICD-10-CM | POA: Insufficient documentation

## 2021-07-28 DIAGNOSIS — M25662 Stiffness of left knee, not elsewhere classified: Secondary | ICD-10-CM | POA: Insufficient documentation

## 2021-07-28 NOTE — Therapy (Signed)
[place] @ [place], [place], [address] Phone: [phone]   Fax:  [phone]  Physical Therapy Treatment  Patient Details  Name: Joseph Werner MRN: [id number] Date of Birth: 1935-03-07 Referring Provider (PT): Shona Simpson   Encounter Date: 07/28/2021   PT End of Session - 07/28/21 1400     Visit Number 30    Number of Visits 32    Date for PT Re-Evaluation 08/02/21    PT Start Time 8921    PT Stop Time 1941    PT Time Calculation (min) 50 min    Activity Tolerance Patient tolerated treatment well    Behavior During Therapy [facility] for tasks assessed/performed             Past Medical History:  Diagnosis Date   Anemia    macrocytic anemia   Arthritis    Bradycardia    chronic   CAD (coronary artery disease)    Dyslipidemia    Eczema    Hyperlipidemia    Hypothyroid    Loss of weight    Macrocytosis    with normal bone marrow biopsy in 2006 may be benign varient, evaluated in the pastby hematologistDr. Benay Spice   Osteopenia    on bone density test from orthopedist Dr. Nelva Bush, bone density test in July 2010 showed T score-1.1 in the lumbar spine and 2.1 in the femur. Bone density in August 2012 showed T score 2.3 in the femur and 2.2 in the forearm   Postsurgical aortocoronary bypass status    Prostate cancer [facility])    H/O   Prostate cancer [facility])    followed by urology Dr. Diona Fanti    Past Surgical History:  Procedure Laterality Date   [place] Right 11/18/2012   Procedure: HERNIA REPAIR INGUINAL ADULT;  Surgeon: Merrie Roof, MD;  Location: [place];  Service: General;  Laterality: Right;   INSERTION OF MESH Right 11/18/2012   Procedure: INSERTION OF MESH;  Surgeon: Merrie Roof, MD;  Location: [place];  Service: General;  Laterality: Right;   PROSTATECTOMY      There were  no vitals filed for this visit.   Subjective Assessment - 07/28/21 1307     Subjective I am feeling ok today.    Pertinent History L thigh sarcoma, currently undergoing radiation, underwent sarcoma removal surgery on 04/11/21, heart bypass surgery, gallbladder removed, prostate cancer    Patient Stated Goals to wake up one morning without any pain and be able to walk across the room like nothing happened    Currently in Pain? No/denies    Pain Score 0-No pain                [facility] PT Assessment - 07/28/21 0001       AROM   Left Knee Extension -9    Left Knee Flexion 74   86 passively                          [facility] Adult PT Treatment/Exercise - 07/28/21 0001       Knee/Hip Exercises: Stretches   Passive Hamstring Stretch Left;2 reps;60 seconds    Quad Stretch Left;60 seconds;2 reps   in prone     Knee/Hip Exercises: Aerobic   Nustep Level  1 x 10 min to for ROM with arms at 12      Knee/Hip Exercises: Standing   Knee Flexion Both;10 reps    Knee Flexion Limitations alternating to work on standing balance    Forward Lunges Left;1 set;10 reps    Other Standing Knee Exercises mini squats with prolonged holds holding on to // bars with v/c to keep knees from going over toes      Knee/Hip Exercises: Seated   Heel Slides Limitations 60" x 2 AAROM      Knee/Hip Exercises: Supine   Quad Sets Strengthening;Left;1 set;10 reps   10 sec hold; with towel rolled up under ankle   Short Arc Quad Sets Strengthening;Left;1 set    Short Arc Quad Sets Limitations 4 lbs    Heel Slides Strengthening;Left;1 set;10 reps   with 10 sec hold   Straight Leg Raises Strengthening;Left;1 set;10 reps      Manual Therapy   Passive ROM to L knee in to flexion and to hip in to flexion to stretch quads and hamstrings                          PT Long Term Goals - 06/21/21 1600       PT LONG TERM GOAL #1   Title Pt and/or spouse will be independent in self MLD for  long term management of lymphedema.    Baseline Pt needs to have re-eval after surgery to determine long term managemnt    Time 8    Period Weeks    Status On-going      PT LONG TERM GOAL #2   Title Pt will obtain appropriate compression garments for long term management of lymphedema.    Time 8    Period Weeks    Status On-going      PT LONG TERM GOAL #3   Title Pt will demonstrate a 3 cm decrease in circumference at 20 cm proximal to L suprapatella to decrease risk of infection.    Baseline 63 cm at baseline, 65 cm on 7/8.2022 ( measured over sarcoma site), 55 cm    Time 8    Period Weeks    Status Achieved      PT LONG TERM GOAL #4   Title Pt will report a 50% improvement in overall pain in LLE to allow improved comfort.    Baseline He says that the pain has basically gone away 03/04/2021    Time 8    Period Weeks    Status Achieved      PT LONG TERM GOAL #5   Title Pt will demonstrate 120 degrees of bilateral knee flexion to allow pt to stand from a chair    Baseline 56 on L and 95 on R    Time 6    Period Weeks    Status New    Target Date 08/02/21      Additional Long Term Goals   Additional Long Term Goals Yes      PT LONG TERM GOAL #6   Title Pt will demonstrate ability to sit to stand from a chair without use of UEs    Baseline unable    Time 6    Period Weeks    Status New    Target Date 08/02/21      PT LONG TERM GOAL #7   Title Pt will be independent in a home exercise program for continued strengthening and stretching  Time 6    Period Weeks    Status New    Target Date 08/02/21      PT LONG TERM GOAL #8   Title Pt will be able to ambulate without need for a front wheeled walker to decrease reliance on assistive device    Time 6    Period Weeks    Status New    Target Date 08/02/21                   Plan - 07/28/21 1402     Clinical Impression Statement Assessed pt's knee ROM today. His extension has improved 8 degrees and his  flexion is about the same though it does not seem as tight. He is beginning to develop compensation patterns since his surgery in which part of his hamstrings were removed. Educated pt to do more prone knee bends at home to help improve strength. Pt has been very compliant with his home exercise program. He reports his doctor told him that his progress would be slow. Continued with ROM, stretching and strengthening exercises today for L knee.    PT Frequency 2x / week    PT Duration 6 weeks    PT Treatment/Interventions ADLs/Self Care Home Management;Therapeutic exercise;Therapeutic activities;Patient/family education;Manual techniques;Manual lymph drainage;Compression bandaging;Taping;Vasopneumatic Device;Gait training;Stair training;Passive range of motion;Scar mobilization;Joint Manipulations    PT Next Visit Plan ROM of bilateral knees with focus on L knee, strengthening bilateral LEs, decreasing use of walker, measure for bilat LE stockings once LLE is healed, hamstring and quad stretches    PT Home Exercise Plan Access Code: [email or web address]    Consulted and Agree with Plan of Care Patient;Family member/caregiver    Family Member Consulted wife             Patient will benefit from skilled therapeutic intervention in order to improve the following deficits and impairments:  Pain, Postural dysfunction, Increased fascial restricitons, Decreased strength, Decreased range of motion, Decreased scar mobility, Increased edema, Difficulty walking, Decreased skin integrity, Decreased balance  Visit Diagnosis: Decreased ROM of left knee  Decreased ROM of right knee  Difficulty in walking, not elsewhere classified  Muscle weakness (generalized)     Problem List Patient Active Problem List   Diagnosis Date Noted   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

## 2021-08-02 ENCOUNTER — Other Ambulatory Visit: Payer: Self-pay

## 2021-08-02 ENCOUNTER — Ambulatory Visit: Payer: Medicare Other | Admitting: Physical Therapy

## 2021-08-02 ENCOUNTER — Encounter: Payer: Self-pay | Admitting: Physical Therapy

## 2021-08-02 DIAGNOSIS — M25662 Stiffness of left knee, not elsewhere classified: Secondary | ICD-10-CM | POA: Diagnosis not present

## 2021-08-02 DIAGNOSIS — R262 Difficulty in walking, not elsewhere classified: Secondary | ICD-10-CM

## 2021-08-02 DIAGNOSIS — M25661 Stiffness of right knee, not elsewhere classified: Secondary | ICD-10-CM

## 2021-08-02 DIAGNOSIS — M6281 Muscle weakness (generalized): Secondary | ICD-10-CM

## 2021-08-02 NOTE — Therapy (Signed)
[place] @ [place], [place], [address] Phone: [phone]   Fax:  [phone]  Physical Therapy Treatment  Patient Details  Name: Joseph Werner MRN: [id number] Date of Birth: Nov 02, 1934 Referring Provider (PT): Shona Simpson   Encounter Date: 08/02/2021   PT End of Session - 08/02/21 1155     Visit Number 31    Number of Visits 32    Date for PT Re-Evaluation 08/02/21    PT Start Time 1106    PT Stop Time 1150    PT Time Calculation (min) 44 min    Activity Tolerance Patient tolerated treatment well    Behavior During Therapy [facility] for tasks assessed/performed             Past Medical History:  Diagnosis Date   Anemia    macrocytic anemia   Arthritis    Bradycardia    chronic   CAD (coronary artery disease)    Dyslipidemia    Eczema    Hyperlipidemia    Hypothyroid    Loss of weight    Macrocytosis    with normal bone marrow biopsy in 2006 may be benign varient, evaluated in the pastby hematologistDr. Benay Spice   Osteopenia    on bone density test from orthopedist Dr. Nelva Bush, bone density test in July 2010 showed T score-1.1 in the lumbar spine and 2.1 in the femur. Bone density in August 2012 showed T score 2.3 in the femur and 2.2 in the forearm   Postsurgical aortocoronary bypass status    Prostate cancer [facility])    H/O   Prostate cancer [facility])    followed by urology Dr. Diona Fanti    Past Surgical History:  Procedure Laterality Date   [place] Right 11/18/2012   Procedure: HERNIA REPAIR INGUINAL ADULT;  Surgeon: Merrie Roof, MD;  Location: [place];  Service: General;  Laterality: Right;   INSERTION OF MESH Right 11/18/2012   Procedure: INSERTION OF MESH;  Surgeon: Merrie Roof, MD;  Location: [place];  Service: General;  Laterality: Right;   PROSTATECTOMY      There were  no vitals filed for this visit.   Subjective Assessment - 08/02/21 1126     Subjective One of my issues is still bending over and picking up things at church. Pt unable to get down on the floor due to fear of not being able to get back up.    Pertinent History L thigh sarcoma, currently undergoing radiation, underwent sarcoma removal surgery on 04/11/21, heart bypass surgery, gallbladder removed, prostate cancer    Patient Stated Goals to wake up one morning without any pain and be able to walk across the room like nothing happened    Currently in Pain? No/denies    Pain Score 0-No pain                               [facility] Adult PT Treatment/Exercise - 08/02/21 0001       Knee/Hip Exercises: Stretches   Passive Hamstring Stretch Left;2 reps;60 seconds    Quad Stretch Left;60 seconds;2 reps   in prone     Knee/Hip Exercises: Aerobic   Nustep Level 1 x 10 min to for ROM with arms at 12  Knee/Hip Exercises: Standing   Hip Flexion Stengthening;1 set;Left;10 reps;Knee straight   in // bars   Forward Lunges Left;1 set;10 reps    Forward Lunges Limitations without use of UEs    Hip Abduction Stengthening;Left;1 set;10 reps;Knee straight   in // bars   Hip Extension Stengthening;Left;1 set;10 reps   with v/c to stand up straight in // bars   Other Standing Knee Exercises mini squats with in // bars with v/c not to rely too heavily on bars for support      Knee/Hip Exercises: Seated   Heel Slides Limitations 60" x 2 AAROM      Knee/Hip Exercises: Supine   Quad Sets Strengthening;Left;1 set;10 reps   10 sec hold; with towel rolled up under ankle   Short Arc Quad Sets Strengthening;Left;1 set;10 reps    Short Arc Quad Sets Limitations 4 lbs    Heel Slides Strengthening;Left;1 set;10 reps   with 10 sec hold     Manual Therapy   Passive ROM to L knee in to flexion and to hip in to flexion to stretch quads and hamstrings                          PT  Long Term Goals - 06/21/21 1600       PT LONG TERM GOAL #1   Title Pt and/or spouse will be independent in self MLD for long term management of lymphedema.    Baseline Pt needs to have re-eval after surgery to determine long term managemnt    Time 8    Period Weeks    Status On-going      PT LONG TERM GOAL #2   Title Pt will obtain appropriate compression garments for long term management of lymphedema.    Time 8    Period Weeks    Status On-going      PT LONG TERM GOAL #3   Title Pt will demonstrate a 3 cm decrease in circumference at 20 cm proximal to L suprapatella to decrease risk of infection.    Baseline 63 cm at baseline, 65 cm on 7/8.2022 ( measured over sarcoma site), 55 cm    Time 8    Period Weeks    Status Achieved      PT LONG TERM GOAL #4   Title Pt will report a 50% improvement in overall pain in LLE to allow improved comfort.    Baseline He says that the pain has basically gone away 03/04/2021    Time 8    Period Weeks    Status Achieved      PT LONG TERM GOAL #5   Title Pt will demonstrate 120 degrees of bilateral knee flexion to allow pt to stand from a chair    Baseline 56 on L and 95 on R    Time 6    Period Weeks    Status New    Target Date 08/02/21      Additional Long Term Goals   Additional Long Term Goals Yes      PT LONG TERM GOAL #6   Title Pt will demonstrate ability to sit to stand from a chair without use of UEs    Baseline unable    Time 6    Period Weeks    Status New    Target Date 08/02/21      PT LONG TERM GOAL #7   Title Pt will be independent in a  home exercise program for continued strengthening and stretching    Time 6    Period Weeks    Status New    Target Date 08/02/21      PT LONG TERM GOAL #8   Title Pt will be able to ambulate without need for a front wheeled walker to decrease reliance on assistive device    Time 6    Period Weeks    Status New    Target Date 08/02/21                   Plan -  08/02/21 1155     Clinical Impression Statement Pt demosntrating improved L knee PROM today during stretching. He also is demosntrating less hip flexion with prone knee bends. Pt is able to stand in good posture with verbal cueing. He was also able to exercise in the // bars today with less reliance on bars for support. WIll continue to work on L knee ROM and strengthening. Educated pt to try and keep knee bent when standing from chair.    PT Frequency 2x / week    PT Duration 6 weeks    PT Treatment/Interventions ADLs/Self Care Home Management;Therapeutic exercise;Therapeutic activities;Patient/family education;Manual techniques;Manual lymph drainage;Compression bandaging;Taping;Vasopneumatic Device;Gait training;Stair training;Passive range of motion;Scar mobilization;Joint Manipulations    PT Next Visit Plan updated POC, ROM of bilateral knees with focus on L knee, strengthening bilateral LEs, decreasing use of walker, measure for bilat LE stockings once LLE is healed, hamstring and quad stretches    PT Home Exercise Plan Access Code: [email or web address]    Consulted and Agree with Plan of Care Patient;Family member/caregiver             Patient will benefit from skilled therapeutic intervention in order to improve the following deficits and impairments:  Pain, Postural dysfunction, Increased fascial restricitons, Decreased strength, Decreased range of motion, Decreased scar mobility, Increased edema, Difficulty walking, Decreased skin integrity, Decreased balance  Visit Diagnosis: Decreased ROM of left knee  Decreased ROM of right knee  Difficulty in walking, not elsewhere classified  Muscle weakness (generalized)     Problem List Patient Active Problem List   Diagnosis Date Noted   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

## 2021-08-04 ENCOUNTER — Encounter: Payer: Self-pay | Admitting: Physical Therapy

## 2021-08-04 ENCOUNTER — Ambulatory Visit: Payer: Medicare Other | Admitting: Physical Therapy

## 2021-08-04 ENCOUNTER — Other Ambulatory Visit: Payer: Self-pay

## 2021-08-04 DIAGNOSIS — M6281 Muscle weakness (generalized): Secondary | ICD-10-CM

## 2021-08-04 DIAGNOSIS — M25661 Stiffness of right knee, not elsewhere classified: Secondary | ICD-10-CM

## 2021-08-04 DIAGNOSIS — M25662 Stiffness of left knee, not elsewhere classified: Secondary | ICD-10-CM

## 2021-08-04 DIAGNOSIS — R262 Difficulty in walking, not elsewhere classified: Secondary | ICD-10-CM

## 2021-08-04 NOTE — Therapy (Signed)
[place] @ [place], [place], [address] Phone: [phone]   Fax:  [phone]  Physical Therapy Treatment  Patient Details  Name: Joseph Werner MRN: [id number] Date of Birth: 08/02/1935 Referring Provider (PT): Shona Simpson   Encounter Date: 08/04/2021   PT End of Session - 08/04/21 1306     Visit Number 32    Number of Visits 40    Date for PT Re-Evaluation 09/01/21    PT Start Time 1103    PT Stop Time 1150    PT Time Calculation (min) 47 min    Activity Tolerance Patient tolerated treatment well    Behavior During Therapy [facility] for tasks assessed/performed             Past Medical History:  Diagnosis Date   Anemia    macrocytic anemia   Arthritis    Bradycardia    chronic   CAD (coronary artery disease)    Dyslipidemia    Eczema    Hyperlipidemia    Hypothyroid    Loss of weight    Macrocytosis    with normal bone marrow biopsy in 2006 may be benign varient, evaluated in the pastby hematologistDr. Benay Spice   Osteopenia    on bone density test from orthopedist Dr. Nelva Bush, bone density test in July 2010 showed T score-1.1 in the lumbar spine and 2.1 in the femur. Bone density in August 2012 showed T score 2.3 in the femur and 2.2 in the forearm   Postsurgical aortocoronary bypass status    Prostate cancer [facility])    H/O   Prostate cancer [facility])    followed by urology Dr. Diona Fanti    Past Surgical History:  Procedure Laterality Date   [place] Right 11/18/2012   Procedure: HERNIA REPAIR INGUINAL ADULT;  Surgeon: Merrie Roof, MD;  Location: [place];  Service: General;  Laterality: Right;   INSERTION OF MESH Right 11/18/2012   Procedure: INSERTION OF MESH;  Surgeon: Merrie Roof, MD;  Location: [place];  Service: General;  Laterality: Right;   PROSTATECTOMY      There were  no vitals filed for this visit.   Subjective Assessment - 08/04/21 1116     Subjective I feel like the leg is doing better.    Pertinent History L thigh sarcoma, currently undergoing radiation, underwent sarcoma removal surgery on 04/11/21, heart bypass surgery, gallbladder removed, prostate cancer    Patient Stated Goals to wake up one morning without any pain and be able to walk across the room like nothing happened    Currently in Pain? No/denies    Pain Score 0-No pain                [facility] PT Assessment - 08/04/21 0001       AROM   Left Knee Extension -4    Left Knee Flexion 80                           [facility] Adult PT Treatment/Exercise - 08/04/21 0001       Knee/Hip Exercises: Stretches   Passive Hamstring Stretch Left;2 reps;60 seconds    Quad Stretch Left;60 seconds;2 reps   in prone     Knee/Hip Exercises: Aerobic   Nustep Level  1 x 10 min to for ROM with arms at 12      Knee/Hip Exercises: Standing   Heel Raises Both;1 set;10 reps    Hip Flexion Stengthening;1 set;Left;10 reps;Knee straight   in // bars without using UEs   Forward Lunges Left;1 set;10 reps    Forward Lunges Limitations without use of UEs    Hip Abduction Stengthening;Left;1 set;10 reps;Knee straight   in // bars without using UEs   Hip Extension Stengthening;Left;1 set;10 reps   with v/c to stand up straight in // bars without UEs     Knee/Hip Exercises: Seated   Heel Slides Limitations 60" x 2 AAROM      Knee/Hip Exercises: Supine   Quad Sets Strengthening;Left;1 set;10 reps   10 sec hold; with towel rolled up under ankle   Heel Slides Strengthening;Left;1 set;10 reps   with 10 sec hold   Straight Leg Raises Strengthening;Left;1 set;10 reps   with verbal cues to keep knee straight   Other Supine Knee/Hip Exercises hip abduction with pillow case under heel x 10 reps      Manual Therapy   Passive ROM to L knee in to flexion and to hip in to flexion to stretch quads and  hamstrings                          PT Long Term Goals - 08/04/21 1309       PT LONG TERM GOAL #1   Title Pt and/or spouse will be independent in self MLD for long term management of lymphedema.    Baseline Pt needs to have re-eval after surgery to determine long term managemnt    Time 8    Period Weeks    Status Deferred      PT LONG TERM GOAL #2   Title Pt will obtain appropriate compression garments for long term management of lymphedema.    Time 8    Period Weeks    Status Achieved      PT LONG TERM GOAL #3   Title Pt will demonstrate a 3 cm decrease in circumference at 20 cm proximal to L suprapatella to decrease risk of infection.    Baseline 63 cm at baseline, 65 cm on 7/8.2022 ( measured over sarcoma site), 55 cm    Time 8    Period Weeks    Status Achieved      PT LONG TERM GOAL #4   Title Pt will report a 50% improvement in overall pain in LLE to allow improved comfort.    Baseline He says that the pain has basically gone away 03/04/2021    Time 8    Period Weeks    Status Achieved      PT LONG TERM GOAL #5   Title Pt will demonstrate 120 degrees of bilateral knee flexion to allow pt to stand from a chair    Baseline 56 on L and 95 on R; 08/04/21- 80 on L    Time 6    Period Weeks    Status On-going    Target Date 08/02/21      PT LONG TERM GOAL #6   Title Pt will demonstrate ability to sit to stand from a chair without use of UEs    Baseline unable; 08/04/21- still requires use of UEs but is able to bend his knee further back to assist    Time 6    Period Weeks  Status On-going      PT LONG TERM GOAL #7   Title Pt will be independent in a home exercise program for continued strengthening and stretching    Time 6    Period Weeks    Status On-going      PT LONG TERM GOAL #8   Title Pt will be able to ambulate without need for a front wheeled walker to decrease reliance on assistive device    Baseline 08/04/21- pt no longer uses an  assistive device for ambulation    Time 6    Period Weeks    Status Achieved                   Plan - 08/04/21 1311     Clinical Impression Statement Pt has made excellent progress towards his goals in therapy. He is now able to ambulate without use of a walker. He also does not use a cane. He is demonstrating improved knee flexion and hamstring length. He is compensating less during prone knee bends and his knee extension ROM is also improving. Pt would benefit from continued skilled PT services to continue to increase bilateral knee ROM so that pt is able to sit to stand without use of UEs and to instruct pt in a home exercise program for continued strengthening and stretching.    PT Frequency 2x / week    PT Duration 6 weeks    PT Treatment/Interventions ADLs/Self Care Home Management;Therapeutic exercise;Therapeutic activities;Patient/family education;Manual techniques;Manual lymph drainage;Compression bandaging;Taping;Vasopneumatic Device;Gait training;Stair training;Passive range of motion;Scar mobilization;Joint Manipulations    PT Next Visit Plan ROM of bilateral knees with focus on L knee, strengthening bilateral LEs, decreasing use of walker, measure for bilat LE stockings once LLE is healed, hamstring and quad stretches    PT Home Exercise Plan Access Code: [email or web address]    Consulted and Agree with Plan of Care Patient;Family member/caregiver    Family Member Consulted wife             Patient will benefit from skilled therapeutic intervention in order to improve the following deficits and impairments:  Pain, Postural dysfunction, Increased fascial restricitons, Decreased strength, Decreased range of motion, Decreased scar mobility, Increased edema, Difficulty walking, Decreased skin integrity, Decreased balance  Visit Diagnosis: Decreased ROM of left knee  Decreased ROM of right knee  Difficulty in walking, not elsewhere classified  Muscle weakness  (generalized)     Problem List Patient Active Problem List   Diagnosis Date Noted   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

## 2021-08-08 ENCOUNTER — Encounter: Payer: Self-pay | Admitting: Physical Therapy

## 2021-08-08 ENCOUNTER — Ambulatory Visit: Payer: Medicare Other | Admitting: Physical Therapy

## 2021-08-08 ENCOUNTER — Other Ambulatory Visit: Payer: Self-pay

## 2021-08-08 DIAGNOSIS — M25661 Stiffness of right knee, not elsewhere classified: Secondary | ICD-10-CM

## 2021-08-08 DIAGNOSIS — I890 Lymphedema, not elsewhere classified: Secondary | ICD-10-CM

## 2021-08-08 DIAGNOSIS — M6281 Muscle weakness (generalized): Secondary | ICD-10-CM

## 2021-08-08 DIAGNOSIS — M25662 Stiffness of left knee, not elsewhere classified: Secondary | ICD-10-CM

## 2021-08-08 DIAGNOSIS — R262 Difficulty in walking, not elsewhere classified: Secondary | ICD-10-CM

## 2021-08-08 NOTE — Therapy (Signed)
[facility] @ [address] [place], [place], [address] Phone: [phone]   Fax:  [phone]  Physical Therapy Treatment  Patient Details  Name: Joseph Werner MRN: [id number] Date of Birth: 1935-03-08 Referring Provider (PT): Shona Simpson   Encounter Date: 08/08/2021   PT End of Session - 08/08/21 1625     Visit Number 33    Number of Visits 40    Date for PT Re-Evaluation 09/01/21    PT Start Time 1538    PT Stop Time 1625    PT Time Calculation (min) 47 min    Activity Tolerance Patient tolerated treatment well    Behavior During Therapy [facility] for tasks assessed/performed             Past Medical History:  Diagnosis Date   Anemia    macrocytic anemia   Arthritis    Bradycardia    chronic   CAD (coronary artery disease)    Dyslipidemia    Eczema    Hyperlipidemia    Hypothyroid    Loss of weight    Macrocytosis    with normal bone marrow biopsy in 2006 may be benign varient, evaluated in the pastby hematologistDr. Benay Spice   Osteopenia    on bone density test from orthopedist Dr. Nelva Bush, bone density test in July 2010 showed T score-1.1 in the lumbar spine and 2.1 in the femur. Bone density in August 2012 showed T score 2.3 in the femur and 2.2 in the forearm   Postsurgical aortocoronary bypass status    Prostate cancer [facility])    H/O   Prostate cancer [facility])    followed by urology Dr. Diona Fanti    Past Surgical History:  Procedure Laterality Date   [place] Right 11/18/2012   Procedure: HERNIA REPAIR INGUINAL ADULT;  Surgeon: Merrie Roof, MD;  Location: [place];  Service: General;  Laterality: Right;   INSERTION OF MESH Right 11/18/2012   Procedure: INSERTION OF MESH;  Surgeon: Merrie Roof, MD;  Location: [place];  Service: General;  Laterality: Right;   PROSTATECTOMY      There were  no vitals filed for this visit.   Subjective Assessment - 08/08/21 1541     Subjective The knee is a little stiff but not more than normal.    Pertinent History L thigh sarcoma, currently undergoing radiation, underwent sarcoma removal surgery on 04/11/21, heart bypass surgery, gallbladder removed, prostate cancer    Patient Stated Goals to wake up one morning without any pain and be able to walk across the room like nothing happened    Currently in Pain? No/denies    Pain Score 0-No pain                               [facility] Adult PT Treatment/Exercise - 08/08/21 0001       Knee/Hip Exercises: Aerobic   Nustep Level 3 x 10 min to for ROM with arms at 11      Knee/Hip Exercises: Standing   Heel Raises Both;1 set;10 reps    Hip Flexion Stengthening;1 set;Left;10 reps;Knee straight   in // bars without using UEs   Forward Lunges Left;1 set;10 reps    Forward Lunges Limitations without use of UEs  Hip Abduction Stengthening;Left;1 set;10 reps;Knee straight   in // bars without using UEs   Hip Extension Stengthening;Left;1 set;10 reps   with v/c to stand up straight in // bars without UEs   Other Standing Knee Exercises mini squats with in // bars with v/c not to rely too heavily on bars for support      Knee/Hip Exercises: Supine   Quad Sets Strengthening;Left;1 set;10 reps   10 sec hold; with towel rolled up under ankle   Heel Slides Strengthening;Left;1 set;10 reps   with 10 sec hold   Straight Leg Raises Strengthening;Left;1 set;10 reps   with verbal cues to keep knee straight   Other Supine Knee/Hip Exercises hip abduction with pillow case under heel x 10 reps      Knee/Hip Exercises: Prone   Hamstring Curl 1 set;10 reps   with 10 sec holds, v/c to keep hip from flexing     Manual Therapy   Passive ROM to L knee in to flexion and to hip in to flexion to stretch quads and hamstrings                          PT Long Term Goals - 08/04/21 1309        PT LONG TERM GOAL #1   Title Pt and/or spouse will be independent in self MLD for long term management of lymphedema.    Baseline Pt needs to have re-eval after surgery to determine long term managemnt    Time 8    Period Weeks    Status Deferred      PT LONG TERM GOAL #2   Title Pt will obtain appropriate compression garments for long term management of lymphedema.    Time 8    Period Weeks    Status Achieved      PT LONG TERM GOAL #3   Title Pt will demonstrate a 3 cm decrease in circumference at 20 cm proximal to L suprapatella to decrease risk of infection.    Baseline 63 cm at baseline, 65 cm on 7/8.2022 ( measured over sarcoma site), 55 cm    Time 8    Period Weeks    Status Achieved      PT LONG TERM GOAL #4   Title Pt will report a 50% improvement in overall pain in LLE to allow improved comfort.    Baseline He says that the pain has basically gone away 03/04/2021    Time 8    Period Weeks    Status Achieved      PT LONG TERM GOAL #5   Title Pt will demonstrate 120 degrees of bilateral knee flexion to allow pt to stand from a chair    Baseline 56 on L and 95 on R; 08/04/21- 80 on L    Time 6    Period Weeks    Status On-going    Target Date 08/02/21      PT LONG TERM GOAL #6   Title Pt will demonstrate ability to sit to stand from a chair without use of UEs    Baseline unable; 08/04/21- still requires use of UEs but is able to bend his knee further back to assist    Time 6    Period Weeks    Status On-going      PT LONG TERM GOAL #7   Title Pt will be independent in a home exercise program for continued strengthening and stretching  Time 6    Period Weeks    Status On-going      PT LONG TERM GOAL #8   Title Pt will be able to ambulate without need for a front wheeled walker to decrease reliance on assistive device    Baseline 08/04/21- pt no longer uses an assistive device for ambulation    Time 6    Period Weeks    Status Achieved                    Plan - 08/08/21 1626     Clinical Impression Statement Pt is demonstrating improving knee ROM. He was able to bend his knee further today especially on the NuStep - he was able to nearly straighten his RLE and allow his LLE to bend further. This is an improvement from last session. Pt has been practicing standing from a chair without use of UEs. He is still limited with prone knee bends but continues to demonstrate decreased muscle substitution with this. He would benefit from continued skilled PT services to continue to improve knee ROM and strength.    PT Frequency 2x / week    PT Duration 6 weeks    PT Treatment/Interventions ADLs/Self Care Home Management;Therapeutic exercise;Therapeutic activities;Patient/family education;Manual techniques;Manual lymph drainage;Compression bandaging;Taping;Vasopneumatic Device;Gait training;Stair training;Passive range of motion;Scar mobilization;Joint Manipulations    PT Next Visit Plan ROM of bilateral knees with focus on L knee, strengthening bilateral LEs, decreasing use of walker, measure for bilat LE stockings once LLE is healed, hamstring and quad stretches    PT Home Exercise Plan Access Code: [email or web address]    Consulted and Agree with Plan of Care Patient;Family member/caregiver    Family Member Consulted wife             Patient will benefit from skilled therapeutic intervention in order to improve the following deficits and impairments:  Pain, Postural dysfunction, Increased fascial restricitons, Decreased strength, Decreased range of motion, Decreased scar mobility, Increased edema, Difficulty walking, Decreased skin integrity, Decreased balance  Visit Diagnosis: Decreased ROM of left knee  Decreased ROM of right knee  Difficulty in walking, not elsewhere classified  Muscle weakness (generalized)  Lymphedema, not elsewhere classified     Problem List Patient Active Problem List   Diagnosis Date Noted   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

## 2021-08-10 ENCOUNTER — Ambulatory Visit: Payer: Medicare Other | Admitting: Physical Therapy

## 2021-08-10 ENCOUNTER — Other Ambulatory Visit: Payer: Self-pay

## 2021-08-10 DIAGNOSIS — M25661 Stiffness of right knee, not elsewhere classified: Secondary | ICD-10-CM

## 2021-08-10 DIAGNOSIS — R262 Difficulty in walking, not elsewhere classified: Secondary | ICD-10-CM

## 2021-08-10 DIAGNOSIS — M25662 Stiffness of left knee, not elsewhere classified: Secondary | ICD-10-CM | POA: Diagnosis not present

## 2021-08-10 DIAGNOSIS — M6281 Muscle weakness (generalized): Secondary | ICD-10-CM

## 2021-08-10 NOTE — Therapy (Signed)
[facility] @ [address] [place], [place], [address] Phone: [phone]   Fax:  [phone]  Physical Therapy Treatment  Patient Details  Name: Joseph Werner MRN: [id number] Date of Birth: October 10, 1934 Referring Provider (PT): Shona Simpson   Encounter Date: 08/10/2021   PT End of Session - 08/10/21 1555     Visit Number 34    Number of Visits 40    Date for PT Re-Evaluation 09/01/21    PT Start Time 4680    PT Stop Time 1648    PT Time Calculation (min) 56 min    Activity Tolerance Patient tolerated treatment well    Behavior During Therapy [facility] for tasks assessed/performed             Past Medical History:  Diagnosis Date   Anemia    macrocytic anemia   Arthritis    Bradycardia    chronic   CAD (coronary artery disease)    Dyslipidemia    Eczema    Hyperlipidemia    Hypothyroid    Loss of weight    Macrocytosis    with normal bone marrow biopsy in 2006 may be benign varient, evaluated in the pastby hematologistDr. Benay Spice   Osteopenia    on bone density test from orthopedist Dr. Nelva Bush, bone density test in July 2010 showed T score-1.1 in the lumbar spine and 2.1 in the femur. Bone density in August 2012 showed T score 2.3 in the femur and 2.2 in the forearm   Postsurgical aortocoronary bypass status    Prostate cancer [facility])    H/O   Prostate cancer [facility])    followed by urology Dr. Diona Fanti    Past Surgical History:  Procedure Laterality Date   [place] Right 11/18/2012   Procedure: HERNIA REPAIR INGUINAL ADULT;  Surgeon: Merrie Roof, MD;  Location: [place];  Service: General;  Laterality: Right;   INSERTION OF MESH Right 11/18/2012   Procedure: INSERTION OF MESH;  Surgeon: Merrie Roof, MD;  Location: [place];  Service: General;  Laterality: Right;   PROSTATECTOMY      There were  no vitals filed for this visit.       [facility] PT Assessment - 08/10/21 0001       AROM   Left Knee Extension -3    Left Knee Flexion 74                           [facility] Adult PT Treatment/Exercise - 08/10/21 0001       Knee/Hip Exercises: Stretches   Passive Hamstring Stretch Left;2 reps;60 seconds    Quad Stretch Left;60 seconds;2 reps   in prone     Knee/Hip Exercises: Aerobic   Nustep Level 5 x 11 min to for ROM with arms at 11 and seat at 13      Knee/Hip Exercises: Standing   Heel Raises Both;1 set;10 reps    Hip Flexion Stengthening;1 set;Left;10 reps;Knee straight   in // bars without using UEs   Forward Lunges Left;1 set;10 reps    Forward Lunges Limitations without use of UEs    Hip Abduction Stengthening;Left;1 set;10 reps;Knee straight   in // bars without using UEs   Hip Extension Stengthening;Left;1 set;10 reps   with v/c  to stand up straight in // bars without UEs   Lateral Step Up Left;1 set;10 reps;Hand Hold: 2;Step Height: 6"    Forward Step Up Left;1 set;10 reps;Hand Hold: 2;Step Height: 6"    Other Standing Knee Exercises mini squats with in // bars with v/c not to rely too heavily on bars for support    Other Standing Knee Exercises stepping over 2 orange cones placed side by side with L foot x 10 with pt having to concentrate to clear foot      Knee/Hip Exercises: Seated   Heel Slides Limitations 60" x 2 AAROM      Knee/Hip Exercises: Supine   Quad Sets Strengthening;Left;1 set;10 reps   10 sec hold; with towel rolled up under ankle   Heel Slides Strengthening;Left;1 set;10 reps   with 10 sec hold   Straight Leg Raises Strengthening;Left;1 set;10 reps   with verbal cues to keep knee straight   Other Supine Knee/Hip Exercises hip abduction with pillow case under heel x 10 reps      Knee/Hip Exercises: Prone   Hamstring Curl 1 set;10 reps   with 10 sec holds, v/c to keep hip from flexing     Manual Therapy   Passive ROM to L knee in to  flexion and to hip in to flexion to stretch quads and hamstrings                          PT Long Term Goals - 08/04/21 1309       PT LONG TERM GOAL #1   Title Pt and/or spouse will be independent in self MLD for long term management of lymphedema.    Baseline Pt needs to have re-eval after surgery to determine long term managemnt    Time 8    Period Weeks    Status Deferred      PT LONG TERM GOAL #2   Title Pt will obtain appropriate compression garments for long term management of lymphedema.    Time 8    Period Weeks    Status Achieved      PT LONG TERM GOAL #3   Title Pt will demonstrate a 3 cm decrease in circumference at 20 cm proximal to L suprapatella to decrease risk of infection.    Baseline 63 cm at baseline, 65 cm on 7/8.2022 ( measured over sarcoma site), 55 cm    Time 8    Period Weeks    Status Achieved      PT LONG TERM GOAL #4   Title Pt will report a 50% improvement in overall pain in LLE to allow improved comfort.    Baseline He says that the pain has basically gone away 03/04/2021    Time 8    Period Weeks    Status Achieved      PT LONG TERM GOAL #5   Title Pt will demonstrate 120 degrees of bilateral knee flexion to allow pt to stand from a chair    Baseline 56 on L and 95 on R; 08/04/21- 80 on L    Time 6    Period Weeks    Status On-going    Target Date 08/02/21      PT LONG TERM GOAL #6   Title Pt will demonstrate ability to sit to stand from a chair without use of UEs    Baseline unable; 08/04/21- still requires use of UEs but is able to bend his knee  further back to assist    Time 6    Period Weeks    Status On-going      PT LONG TERM GOAL #7   Title Pt will be independent in a home exercise program for continued strengthening and stretching    Time 6    Period Weeks    Status On-going      PT LONG TERM GOAL #8   Title Pt will be able to ambulate without need for a front wheeled walker to decrease reliance on assistive  device    Baseline 08/04/21- pt no longer uses an assistive device for ambulation    Time 6    Period Weeks    Status Achieved                   Plan - 08/10/21 1657     Clinical Impression Statement Pt demonstrating improving functional ability in regards to L knee flexion. He is almost able to bend it back far enough to stand from the mat table without relying on his hands. He is also demonstrating improved balance and is requiring less hand held assist for standing exercises. Added step ups today which pt was able to complete easily but did have increased difficulty clearing orange cones when practicing stepping over them. Pt undergoes cateract surgery tomorrow and will have to ask doctor for clearance to do prone exercises.    PT Frequency 2x / week    PT Duration 6 weeks    PT Treatment/Interventions ADLs/Self Care Home Management;Therapeutic exercise;Therapeutic activities;Patient/family education;Manual techniques;Manual lymph drainage;Compression bandaging;Taping;Vasopneumatic Device;Gait training;Stair training;Passive range of motion;Scar mobilization;Joint Manipulations    PT Next Visit Plan cont ROM of bilat knees with focus on L knee, see if pt was cleared to do prone exercises    PT Home Exercise Plan Access Code: [email or web address]    Consulted and Agree with Plan of Care Patient;Family member/caregiver    Family Member Consulted wife             Patient will benefit from skilled therapeutic intervention in order to improve the following deficits and impairments:  Pain, Postural dysfunction, Increased fascial restricitons, Decreased strength, Decreased range of motion, Decreased scar mobility, Increased edema, Difficulty walking, Decreased skin integrity, Decreased balance  Visit Diagnosis: Decreased ROM of left knee  Decreased ROM of right knee  Difficulty in walking, not elsewhere classified  Muscle weakness (generalized)     Problem List Patient Active Problem  List   Diagnosis Date Noted   Sarcoma of left thigh ([place]) 01/05/2021   Need for follow-up by home health service 06/08/2020   Atherosclerosis of native coronary artery of native heart without angina pectoris 02/24/2014   S/P CABG (coronary artery bypass graft) 02/24/2014   Bradycardia 02/24/2014   Right inguinal hernia 10/21/2012    Allyson Sabal [place], PT 08/10/2021, 4:59 PM  [facility] @ [place], [place], [address] Phone: [phone]   Fax:  [phone]  Name: Joseph Werner MRN: [id number] Date of Birth: 02-15-1935

## 2021-08-11 ENCOUNTER — Ambulatory Visit: Payer: Medicare Other | Admitting: Rehabilitation

## 2021-08-16 ENCOUNTER — Ambulatory Visit: Payer: Medicare Other

## 2021-08-16 ENCOUNTER — Encounter: Payer: Medicare Other | Admitting: Physical Therapy

## 2021-08-16 ENCOUNTER — Other Ambulatory Visit: Payer: Self-pay

## 2021-08-16 DIAGNOSIS — R262 Difficulty in walking, not elsewhere classified: Secondary | ICD-10-CM

## 2021-08-16 DIAGNOSIS — M79605 Pain in left leg: Secondary | ICD-10-CM

## 2021-08-16 DIAGNOSIS — M25662 Stiffness of left knee, not elsewhere classified: Secondary | ICD-10-CM

## 2021-08-16 DIAGNOSIS — M25661 Stiffness of right knee, not elsewhere classified: Secondary | ICD-10-CM

## 2021-08-16 DIAGNOSIS — M6281 Muscle weakness (generalized): Secondary | ICD-10-CM

## 2021-08-16 NOTE — Therapy (Signed)
[facility] @ [place] [address] [place], [place], [address] Phone: [phone]   Fax:  [phone]  Physical Therapy Treatment  Patient Details  Name: Joseph Werner MRN: [id number] Date of Birth: February 25, 1935 Referring Provider (PT): Shona Simpson   Encounter Date: 08/16/2021   PT End of Session - 08/16/21 1330     Visit Number 35    Number of Visits 40    Date for PT Re-Evaluation 09/01/21    PT Start Time 4008    PT Stop Time 1310    PT Time Calculation (min) 40 min    Activity Tolerance Patient tolerated treatment well    Behavior During Therapy [facility] for tasks assessed/performed             Past Medical History:  Diagnosis Date   Anemia    macrocytic anemia   Arthritis    Bradycardia    chronic   CAD (coronary artery disease)    Dyslipidemia    Eczema    Hyperlipidemia    Hypothyroid    Loss of weight    Macrocytosis    with normal bone marrow biopsy in 2006 may be benign varient, evaluated in the pastby hematologistDr. Benay Spice   Osteopenia    on bone density test from orthopedist Dr. Nelva Bush, bone density test in July 2010 showed T score-1.1 in the lumbar spine and 2.1 in the femur. Bone density in August 2012 showed T score 2.3 in the femur and 2.2 in the forearm   Postsurgical aortocoronary bypass status    Prostate cancer [facility])    H/O   Prostate cancer [facility])    followed by urology Dr. Diona Fanti    Past Surgical History:  Procedure Laterality Date   [place] Right 11/18/2012   Procedure: HERNIA REPAIR INGUINAL ADULT;  Surgeon: Merrie Roof, MD;  Location: [place];  Service: General;  Laterality: Right;   INSERTION OF MESH Right 11/18/2012   Procedure: INSERTION OF MESH;  Surgeon: Merrie Roof, MD;  Location: [place];  Service: General;  Laterality: Right;   PROSTATECTOMY      There were  no vitals filed for this visit.                      [facility] Adult PT Treatment/Exercise - 08/16/21 0001       Transfers   Comments in/out of car : taught reclining seat and either placing left leg in first or sitting first then swinging left leg in while reclined in seat      Knee/Hip Exercises: Stretches   Passive Hamstring Stretch Left;2 reps;60 seconds    Quad Stretch Left;60 seconds;2 reps   in prone   Other Knee/Hip Stretches Stand to sit with progressing knee flexion (PT stabilizing foot position while patient descends) x 8 min at varying angles      Knee/Hip Exercises: Aerobic   Nustep Level 5 x 11 min to for ROM with arms at 11 and seat at 13      Knee/Hip Exercises: Standing   Heel Raises Both;10 reps;2 sets    Hip Flexion Stengthening;1 set;Left;10 reps;Knee straight   in // bars without using UEs   Hip Abduction Stengthening;Left;1 set;10 reps;Knee straight   in // bars without using UEs   Hip Extension Stengthening;Left;1  set;10 reps   with v/c to stand up straight in // bars without UEs                         PT Long Term Goals - 08/04/21 1309       PT LONG TERM GOAL #1   Title Pt and/or spouse will be independent in self MLD for long term management of lymphedema.    Baseline Pt needs to have re-eval after surgery to determine long term managemnt    Time 8    Period Weeks    Status Deferred      PT LONG TERM GOAL #2   Title Pt will obtain appropriate compression garments for long term management of lymphedema.    Time 8    Period Weeks    Status Achieved      PT LONG TERM GOAL #3   Title Pt will demonstrate a 3 cm decrease in circumference at 20 cm proximal to L suprapatella to decrease risk of infection.    Baseline 63 cm at baseline, 65 cm on 7/8.2022 ( measured over sarcoma site), 55 cm    Time 8    Period Weeks    Status Achieved      PT LONG TERM GOAL #4   Title Pt will report a 50% improvement in overall pain in LLE  to allow improved comfort.    Baseline He says that the pain has basically gone away 03/04/2021    Time 8    Period Weeks    Status Achieved      PT LONG TERM GOAL #5   Title Pt will demonstrate 120 degrees of bilateral knee flexion to allow pt to stand from a chair    Baseline 56 on L and 95 on R; 08/04/21- 80 on L    Time 6    Period Weeks    Status On-going    Target Date 08/02/21      PT LONG TERM GOAL #6   Title Pt will demonstrate ability to sit to stand from a chair without use of UEs    Baseline unable; 08/04/21- still requires use of UEs but is able to bend his knee further back to assist    Time 6    Period Weeks    Status On-going      PT LONG TERM GOAL #7   Title Pt will be independent in a home exercise program for continued strengthening and stretching    Time 6    Period Weeks    Status On-going      PT LONG TERM GOAL #8   Title Pt will be able to ambulate without need for a front wheeled walker to decrease reliance on assistive device    Baseline 08/04/21- pt no longer uses an assistive device for ambulation    Time 6    Period Weeks    Status Achieved                   Plan - 08/16/21 1332     Clinical Impression Statement Joseph Werner is performing sit to stand with increased ease.  Post rest as with rising from chair in lobby, he must use his hands and takes a couple of attempts to come to stand but once he begins stretchiing, he is able to come to stand with knee flexed to approx 70 degrees to come to stand with increased ease and with min use of  hands.    Personal Factors and Comorbidities Age;Comorbidity 1    Comorbidities previous hx of prostate cancer    Examination-Activity Limitations Stand;Sit    Examination-Participation Restrictions Driving;Yard Work;Community Activity    Stability/Clinical Decision Making Stable/Uncomplicated    Clinical Decision Making Low    Rehab Potential Good    PT Frequency 2x / week    PT Duration 6 weeks    PT  Treatment/Interventions ADLs/Self Care Home Management;Therapeutic exercise;Therapeutic activities;Patient/family education;Manual techniques;Manual lymph drainage;Compression bandaging;Taping;Vasopneumatic Device;Gait training;Stair training;Passive range of motion;Scar mobilization;Joint Manipulations    PT Next Visit Plan cont ROM of bilat knees with focus on L knee, see if pt was cleared to do prone exercises    PT Home Exercise Plan Access Code: [email or web address]    Consulted and Agree with Plan of Care Patient;Family member/caregiver    Family Member Consulted wife             Patient will benefit from skilled therapeutic intervention in order to improve the following deficits and impairments:  Pain, Postural dysfunction, Increased fascial restricitons, Decreased strength, Decreased range of motion, Decreased scar mobility, Increased edema, Difficulty walking, Decreased skin integrity, Decreased balance  Visit Diagnosis: Decreased ROM of left knee  Decreased ROM of right knee  Difficulty in walking, not elsewhere classified  Muscle weakness (generalized)  Pain in left leg     Problem List Patient Active Problem List   Diagnosis Date Noted   Sarcoma of left thigh ([place]) 01/05/2021   Need for follow-up by home health service 06/08/2020   Atherosclerosis of native coronary artery of native heart without angina pectoris 02/24/2014   S/P CABG (coronary artery bypass graft) 02/24/2014   Bradycardia 02/24/2014   Right inguinal hernia 10/21/2012    Tiauna Whisnant B. Deaun Rocha, PT 12/20/221:35 PM   [facility] @ [place], [place], [address] Phone: [phone]   Fax:  [phone]  Name: Joseph Werner MRN: [id number] Date of Birth: 05/22/1935

## 2021-08-16 NOTE — Patient Instructions (Signed)
Instructed in dynamic self stretching using chair facing wall,  toe against wall then varying chair proximity to the wall for increasing stretch.

## 2021-08-18 ENCOUNTER — Encounter: Payer: Medicare Other | Admitting: Physical Therapy

## 2021-08-18 ENCOUNTER — Ambulatory Visit: Payer: Medicare Other

## 2021-08-18 ENCOUNTER — Other Ambulatory Visit: Payer: Self-pay

## 2021-08-18 DIAGNOSIS — R262 Difficulty in walking, not elsewhere classified: Secondary | ICD-10-CM

## 2021-08-18 DIAGNOSIS — M25662 Stiffness of left knee, not elsewhere classified: Secondary | ICD-10-CM | POA: Diagnosis not present

## 2021-08-18 DIAGNOSIS — M25661 Stiffness of right knee, not elsewhere classified: Secondary | ICD-10-CM

## 2021-08-18 DIAGNOSIS — M6281 Muscle weakness (generalized): Secondary | ICD-10-CM

## 2021-08-18 NOTE — Therapy (Signed)
[place] @ [place], [place], [address] Phone: [phone]   Fax:  [phone]  Physical Therapy Treatment  Patient Details  Name: Joseph Werner MRN: [id number] Date of Birth: May 22, 1935 Referring Provider (PT): Shona Simpson   Encounter Date: 08/18/2021   PT End of Session - 08/18/21 1103     Visit Number 41    PT Start Time 6256    PT Stop Time 1101    PT Time Calculation (min) 49 min    Activity Tolerance Patient tolerated treatment well    Behavior During Therapy [facility] for tasks assessed/performed             Past Medical History:  Diagnosis Date   Anemia    macrocytic anemia   Arthritis    Bradycardia    chronic   CAD (coronary artery disease)    Dyslipidemia    Eczema    Hyperlipidemia    Hypothyroid    Loss of weight    Macrocytosis    with normal bone marrow biopsy in 2006 may be benign varient, evaluated in the pastby hematologistDr. Benay Spice   Osteopenia    on bone density test from orthopedist Dr. Nelva Bush, bone density test in July 2010 showed T score-1.1 in the lumbar spine and 2.1 in the femur. Bone density in August 2012 showed T score 2.3 in the femur and 2.2 in the forearm   Postsurgical aortocoronary bypass status    Prostate cancer [facility])    H/O   Prostate cancer [facility])    followed by urology Dr. Diona Fanti    Past Surgical History:  Procedure Laterality Date   [place] Right 11/18/2012   Procedure: HERNIA REPAIR INGUINAL ADULT;  Surgeon: Merrie Roof, MD;  Location: [place];  Service: General;  Laterality: Right;   INSERTION OF MESH Right 11/18/2012   Procedure: INSERTION OF MESH;  Surgeon: Merrie Roof, MD;  Location: [place];  Service: General;  Laterality: Right;   PROSTATECTOMY      There were no vitals filed for this visit.   Subjective Assessment -  08/18/21 1015     Subjective I'm doing ok.    Pertinent History L thigh sarcoma, currently undergoing radiation, underwent sarcoma removal surgery on 04/11/21, heart bypass surgery, gallbladder removed, prostate cancer    Currently in Pain? No/denies                               [facility] Adult PT Treatment/Exercise - 08/18/21 0001       Knee/Hip Exercises: Stretches   Passive Hamstring Stretch Left;2 reps;60 seconds    Other Knee/Hip Stretches Stand to sit with progressing knee flexion (PT stabilizing foot position while patient descends) x 8 min at varying angles      Knee/Hip Exercises: Aerobic   Nustep Level 5 x 11 min to for ROM with arms at 11 and seat at 13      Knee/Hip Exercises: Standing   Heel Raises --    Hip Flexion Stengthening;1 set;Left;10 reps;Knee straight   in // bars without using UEs   Hip Abduction Stengthening;Left;10 reps;Knee straight;2 sets   in // bars without using UEs   Hip Extension Stengthening;Left;10 reps;2 sets   with v/c to stand  up straight in // bars without UEs   Lateral Step Up Left;1 set;10 reps;Hand Hold: 2;Step Height: 6"    Forward Step Up Left;1 set;10 reps;Hand Hold: 2;Step Height: 6"      Knee/Hip Exercises: Seated   Heel Slides Limitations 60" x 2 AAROM    Hamstring Curl Strengthening;Left;2 sets;10 reps;Limitations    Hamstring Limitations red theraband      Knee/Hip Exercises: Supine   Quad Sets Strengthening;Left;1 set;10 reps   10 sec hold; with towel rolled up under ankle   Short Arc Quad Sets Strengthening;Left;2 sets;10 reps    Short Arc Quad Sets Limitations 2#    Straight Leg Raises Strengthening;Left;10 reps;2 sets   with verbal cues to keep knee straight   Straight Leg Raises Limitations 2#      Manual Therapy   Passive ROM Lt knee into extension with30 second hold x 5, flexion with strap to assist x10                          PT Long Term Goals - 08/18/21 1015       PT LONG TERM  GOAL #4   Title Pt will report a 50% improvement in overall pain in Lt LE to allow improved comfort.    Baseline No Lt leg    Status Achieved      PT LONG TERM GOAL #6   Title Pt will demonstrate ability to sit to stand from a chair without use of UEs    Baseline unable; 08/04/21- still requires use of UEs but is able to bend his knee further back to assist    Status On-going      PT LONG TERM GOAL #8   Title Pt will be able to ambulate without need for a front wheeled walker to decrease reliance on assistive device    Baseline No longer using assistive device    Status Achieved                   Plan - 08/18/21 1043     Clinical Impression Statement Pt is performing sit to stand with increased ease without UE support.  Pt did well with all standing exercises and performed without UE support.  PT provided stand by assistance and verbal cues for technique and safety with exercises. Pt is challenged with stand to sit due to reduced Lt knee A/ROM into flexion.  Pt will continue to benefit from skilled PT to improve Lt knee A/ROM, global strength and mobility.    PT Frequency 2x / week    PT Duration 6 weeks    PT Treatment/Interventions ADLs/Self Care Home Management;Therapeutic exercise;Therapeutic activities;Patient/family education;Manual techniques;Manual lymph drainage;Compression bandaging;Taping;Vasopneumatic Device;Gait training;Stair training;Passive range of motion;Scar mobilization;Joint Manipulations    PT Next Visit Plan cont ROM of bilat knees with focus on L knee, see if pt was cleared to do prone exercises    PT Home Exercise Plan Access Code: [email or web address]    Consulted and Agree with Plan of Care Patient;Family member/caregiver    Family Member Consulted wife             Patient will benefit from skilled therapeutic intervention in order to improve the following deficits and impairments:  Pain, Postural dysfunction, Increased fascial restricitons, Decreased  strength, Decreased range of motion, Decreased scar mobility, Increased edema, Difficulty walking, Decreased skin integrity, Decreased balance  Visit Diagnosis: Decreased ROM of left knee  Decreased ROM of right knee  Difficulty in walking, not elsewhere classified  Muscle weakness (generalized)     Problem List Patient Active Problem List   Diagnosis Date Noted   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

## 2021-08-23 ENCOUNTER — Ambulatory Visit (HOSPITAL_COMMUNITY)
Admission: RE | Admit: 2021-08-23 | Discharge: 2021-08-23 | Disposition: A | Discharge status: Home / Self Care | Payer: Medicare Other | Source: Ambulatory Visit | Attending: Orthopedic Surgery | Admitting: Orthopedic Surgery

## 2021-08-23 ENCOUNTER — Other Ambulatory Visit: Payer: Self-pay

## 2021-08-23 ENCOUNTER — Encounter (HOSPITAL_COMMUNITY): Payer: Self-pay

## 2021-08-23 DIAGNOSIS — Z08 Encounter for follow-up examination after completed treatment for malignant neoplasm: Secondary | ICD-10-CM | POA: Diagnosis present

## 2021-08-23 DIAGNOSIS — Z85831 Personal history of malignant neoplasm of soft tissue: Secondary | ICD-10-CM | POA: Insufficient documentation

## 2021-08-23 IMAGING — MR MR FEMUR*L* WO/W CM
7 of 8 series · 36 of 40 positions shown · IV contrast (gadavist)
Comparison: MRI left femur dated [DATE].

CLINICAL DATA: Left thigh pleomorphic liposarcoma follow-up status
post XRT and surgical resection.

EXAM:
MR OF THE LEFT LOWER EXTREMITY WITHOUT AND WITH CONTRAST
TECHNIQUE: Multiplanar, multisequence MR imaging of the left thigh was
performed both before and after administration of intravenous
contrast.
CONTRAST:  8mL GADAVIST GADOBUTROL 1 MMOL/ML IV SOLN

[Series 3: T1 · coronal · left · 4.0mm · 1.95mm/px · 4 of 40 slices shown (1 of 2)]
[im 1/40]
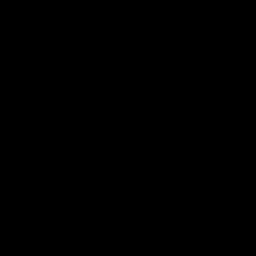
[im 14/40]
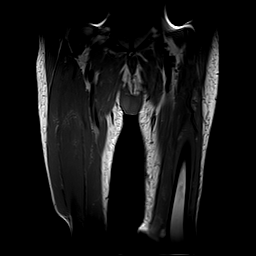
[im 27/40]
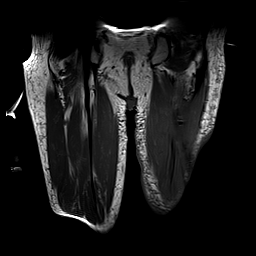
[im 40/40]
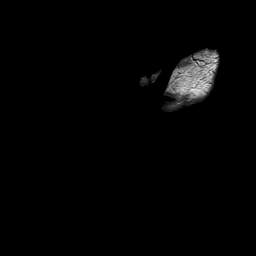

[Series 4: STIR · coronal · left · 4.0mm · 1.95mm/px · 4 of 40 slices shown (1 of 2)]
[im 1/40]
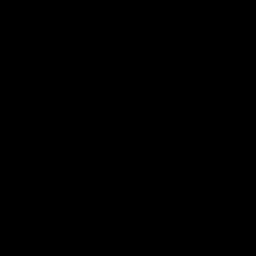
[im 14/40]
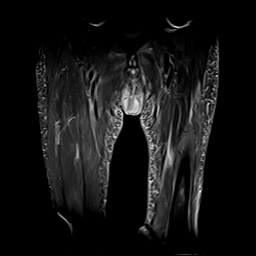
[im 27/40]
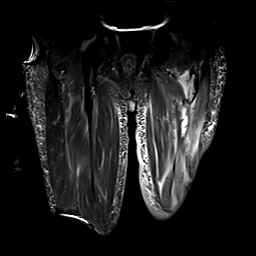
[im 40/40]
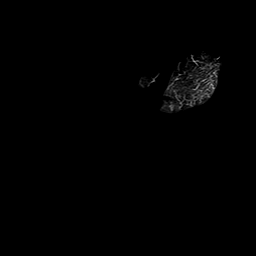

[Series 5: T1 · axial · left · 5.0mm · 1.02mm/px · z∈[-214,+229]mm · 6 of 72 slices shown (2 of 2)]
[im 1/72]
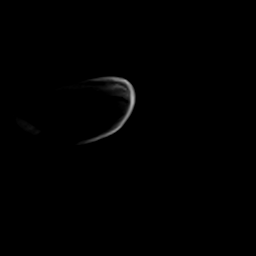
[im 15/72]
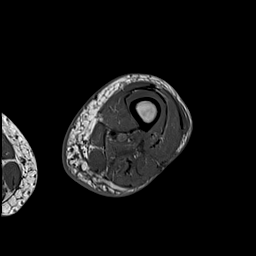
[im 29/72]
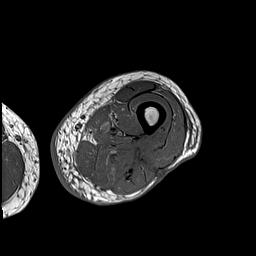
[im 43/72]
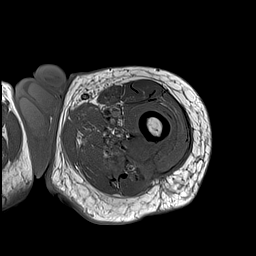
[im 57/72]
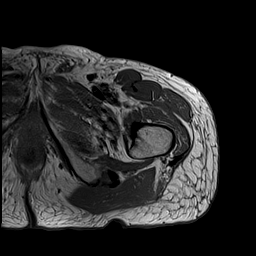
[im 72/72]
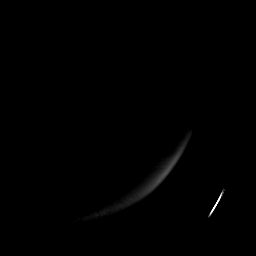

[Series 6: T2 fat-sat · axial · left · 5.0mm · 1.02mm/px · z∈[-214,+229]mm · 6 of 72 slices shown]
[im 1/72]
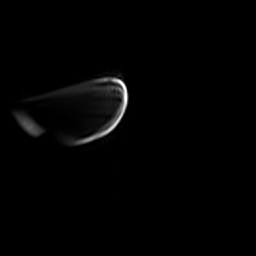
[im 15/72]
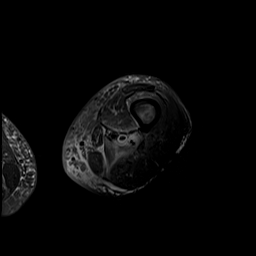
[im 29/72]
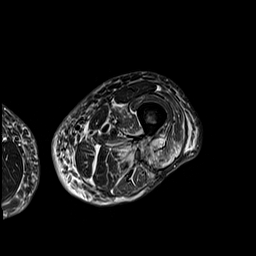
[im 43/72]
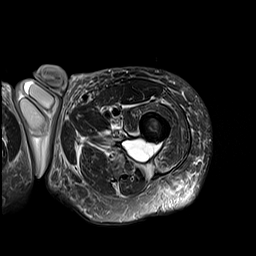
[im 57/72]
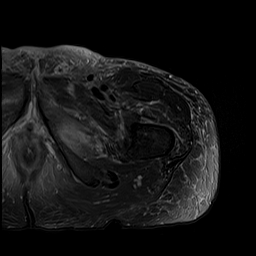
[im 72/72]
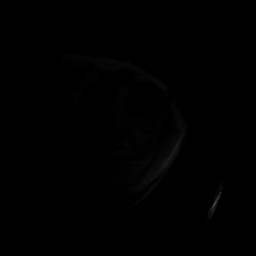

[Series 7: STIR · sagittal · left · 4.0mm · 1.80mm/px · 4 of 42 slices shown (2 of 2)]
[im 1/42]
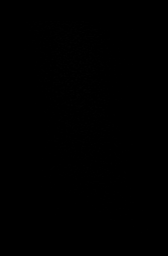
[im 14/42]
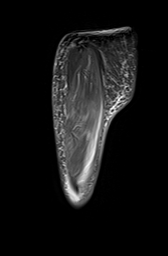
[im 28/42]
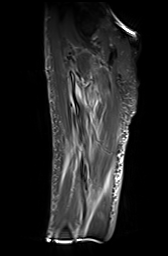
[im 42/42]
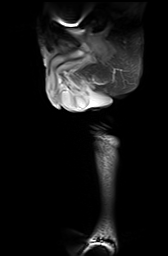

[Series 8: pre axial ti · axial · non-contrast · left · 5.0mm · 0.51mm/px · z∈[-214,+229]mm · 6 of 72 slices shown]
[im 1/72]
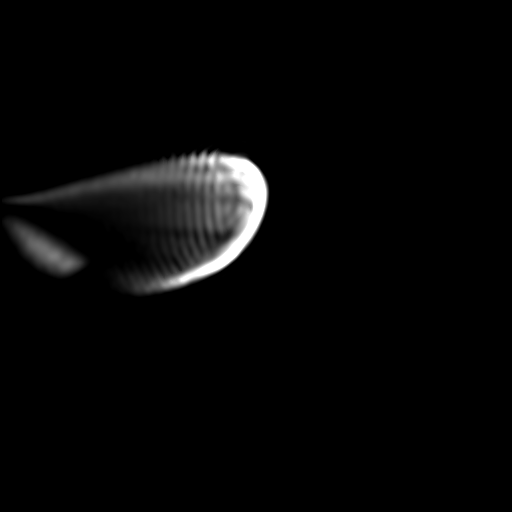
[im 15/72]
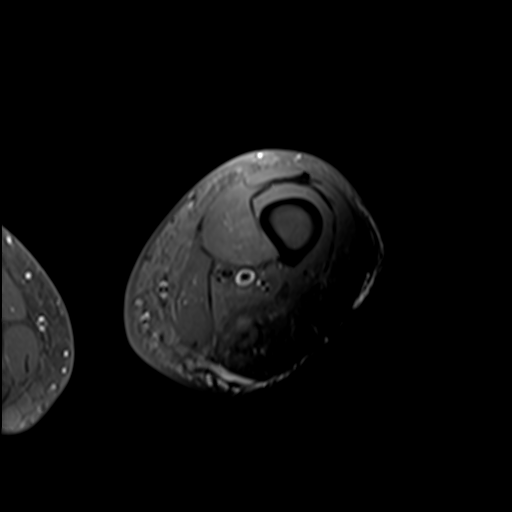
[im 29/72]
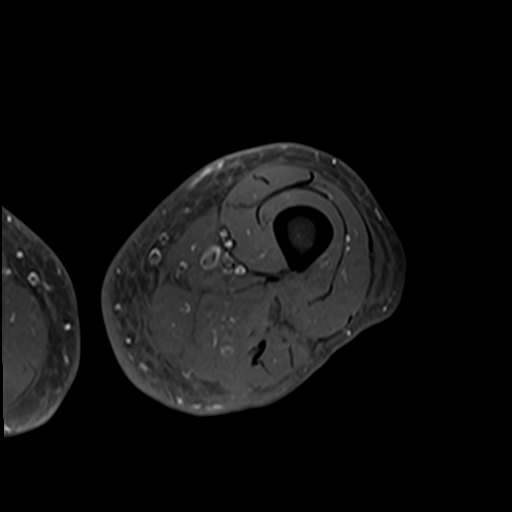
[im 43/72]
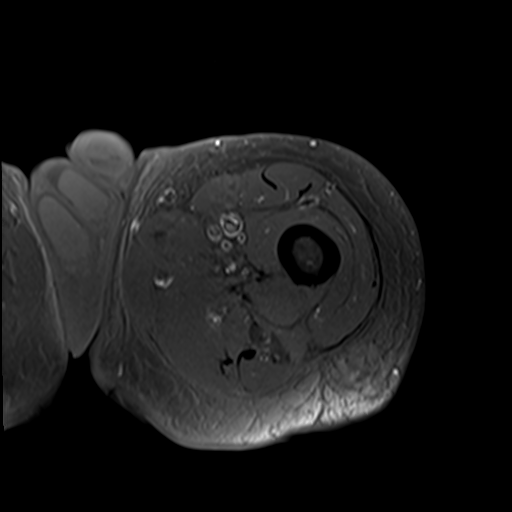
[im 57/72]
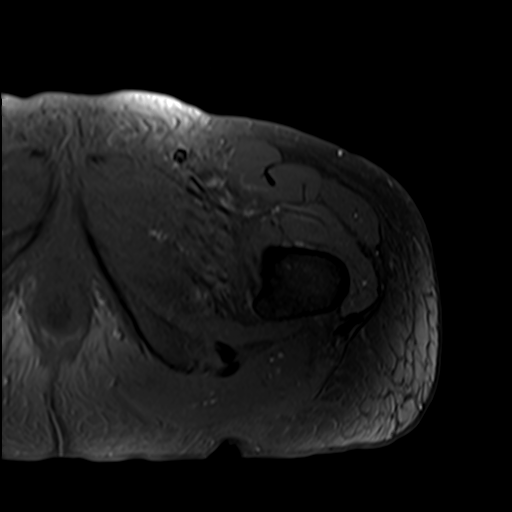
[im 72/72]
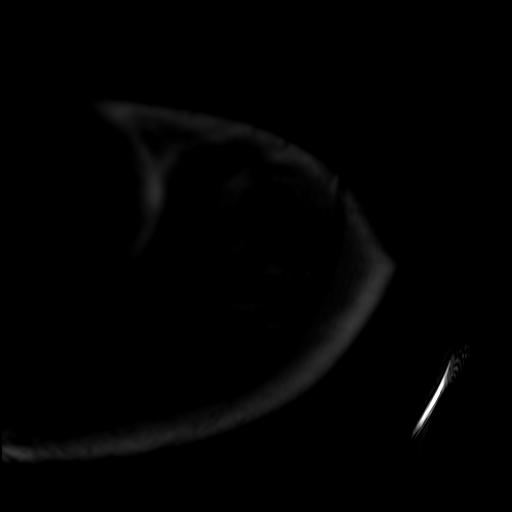

[Series 9: post axial ti · axial · left · 5.0mm · 0.51mm/px · z∈[-214,+229]mm · 6 of 72 slices shown]
[im 1/72]
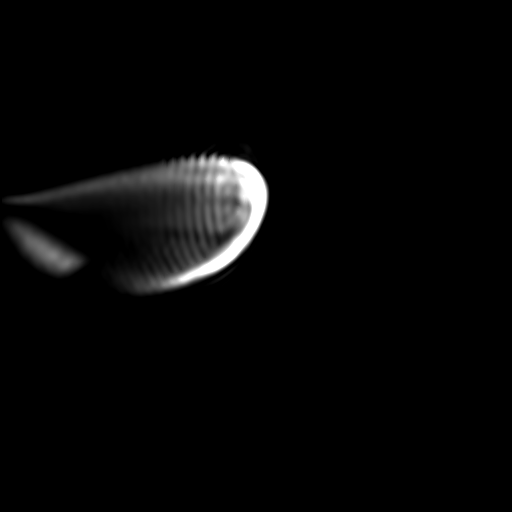
[im 15/72]
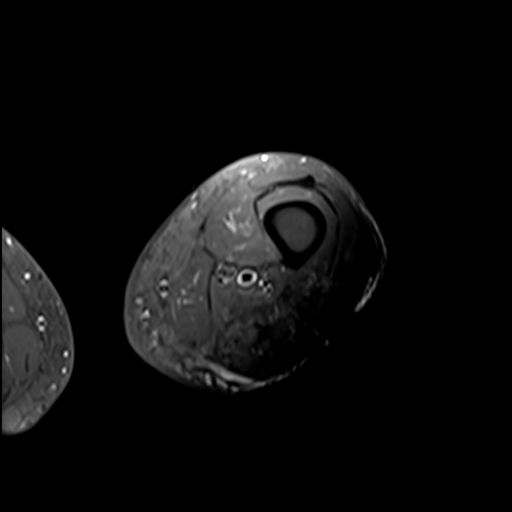
[im 29/72]
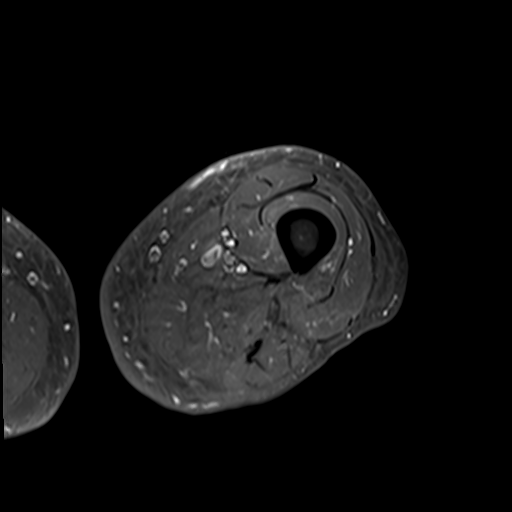
[im 43/72]
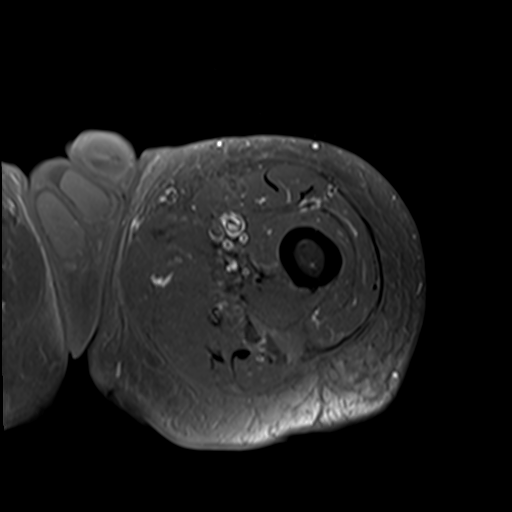
[im 57/72]
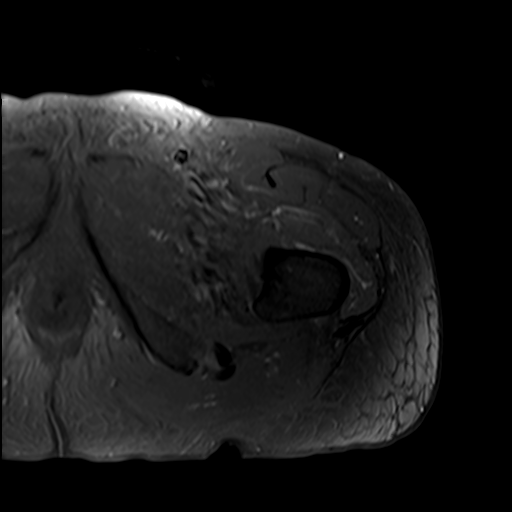
[im 72/72]
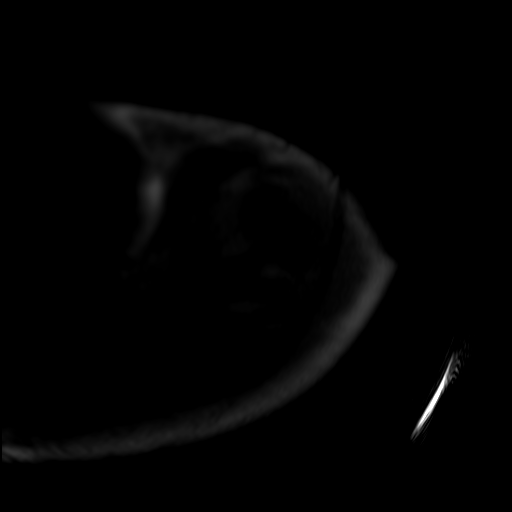

[36 of 40 positions shown; findings below may reference images not displayed]

FINDINGS: Bones/Joint/Cartilage

Similar patchy marrow signal changes in the left femoral diaphysis
presumably related to prior radiation.

Muscles and Tendons
Interval resection of the previously described large mass in the
left biceps femoris muscle. At the surgical site, there is a 3.0 x
4.7 x 8.3 cm rim enhancing T2 hyperintense, T1 iso- to hypointense
collection with multiple thin irregular intrinsically T1
hyperintense non-enhancing septa. No definite solid nodular
enhancement. Patchy muscle edema surrounding the surgical site is
not significantly changed from the prior study and likely represents
radiation related myositis.

Soft tissue
Diffuse soft tissue swelling and skin thickening of the left thigh,
decreased compared to prior study.
IMPRESSION: 1. Interval resection of the previously described large mass in the
left biceps femoris muscle. At the surgical site, there is a 3.0 x
4.7 x 8.3 cm rim enhancing collection with multiple thin
intrinsically T1 hyperintense non-enhancing septa. Given the lack of
definitive solid nodular enhancement, this may represent
postsurgical hematoma, although residual/recurrent tumor is
difficult to entirely exclude. Short-term follow-up imaging in 3
months is recommended.

## 2021-08-23 IMAGING — CT CT CHEST W/O CM
2 of 4 series · 15 of 36 positions shown, 18 images · non-contrast
Comparison: Chest x-ray dated [DATE].

CLINICAL DATA: Left thigh sarcoma. Additional history of prostate
cancer.

EXAM:
CT CHEST WITHOUT CONTRAST
TECHNIQUE: Multidetector CT imaging of the chest was performed following the
standard protocol without IV contrast.

[Series 2: thorax · axial · 0.76mm/px · z∈[-333,-69]mm · 12 of 156 slices shown, 15 images]
[im 12/156  mediastinal]
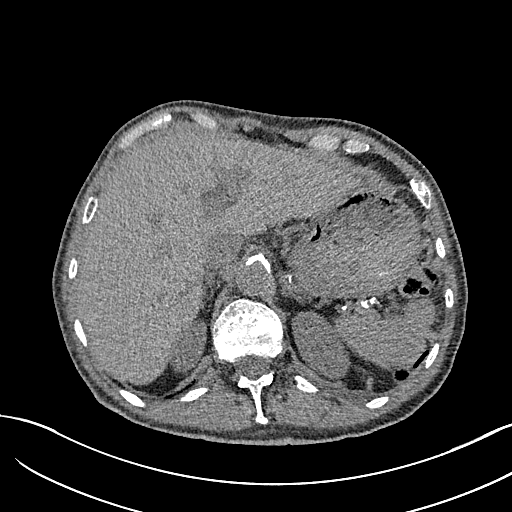
[im 12/156  lung]
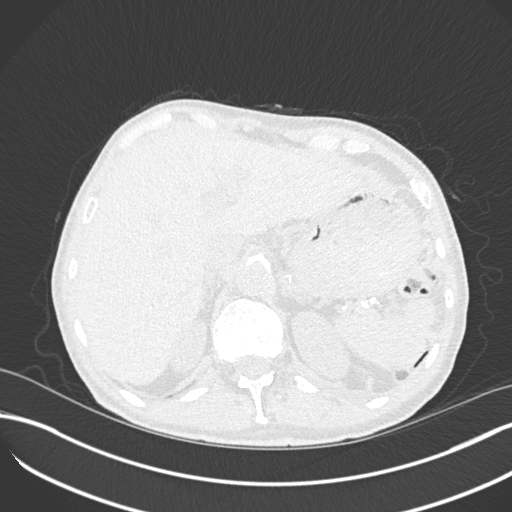
[im 24/156  lung]
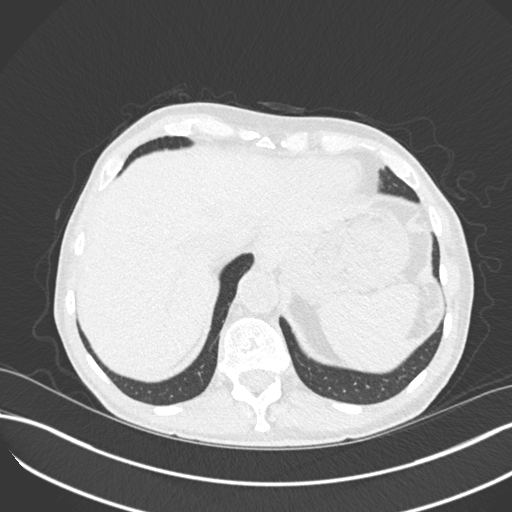
[im 36/156  lung]
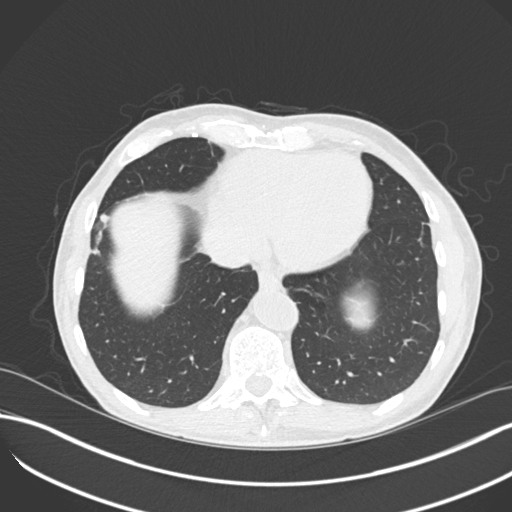
[im 48/156  lung]
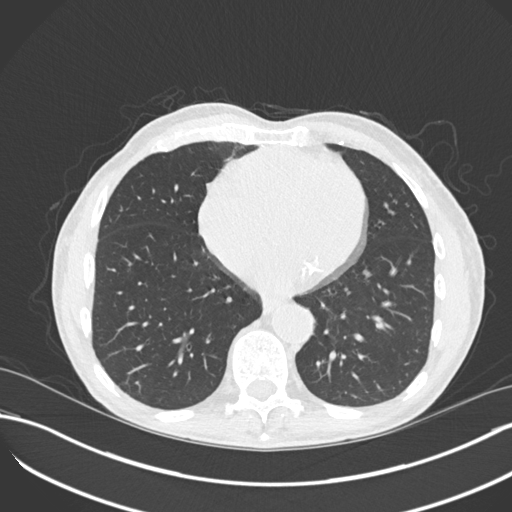
[im 60/156  mediastinal]
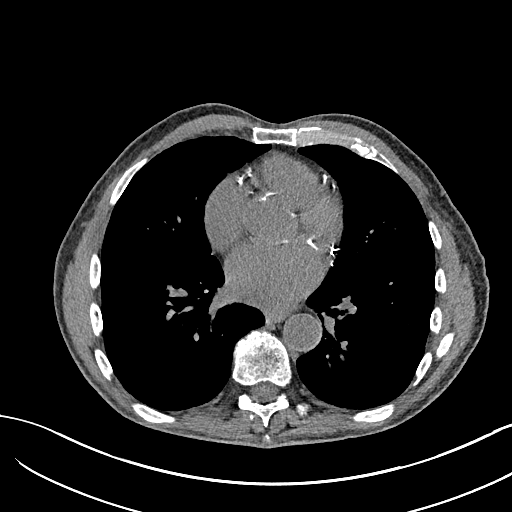
[im 60/156  lung]
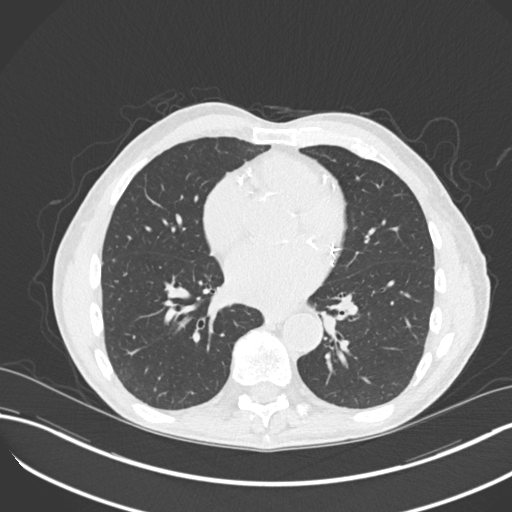
[im 72/156  lung]
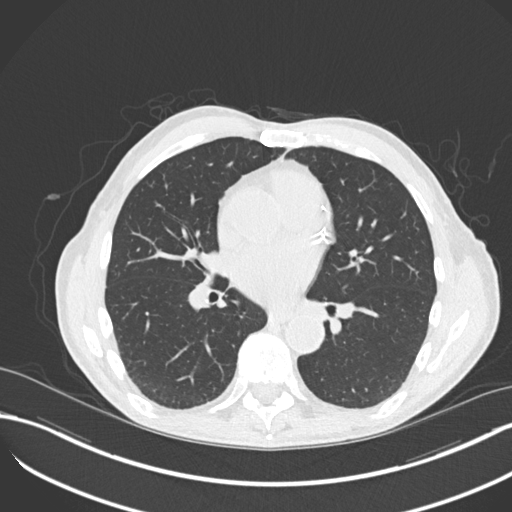
[im 84/156  lung]
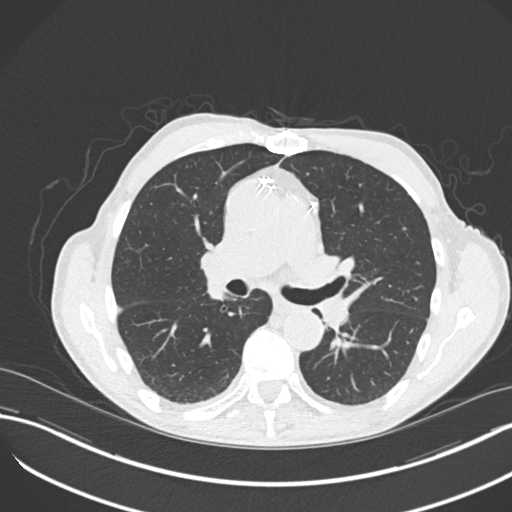
[im 96/156  lung]
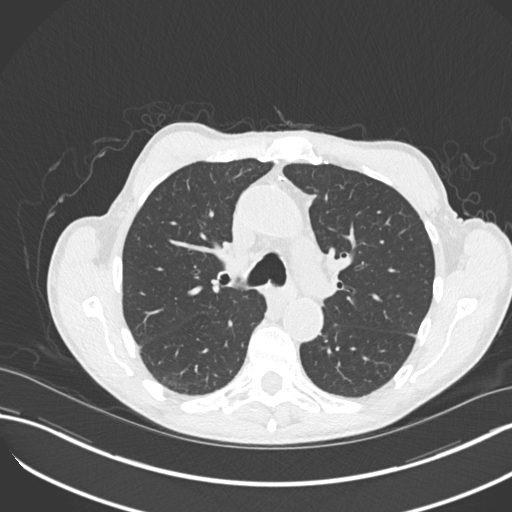
[im 108/156  mediastinal]
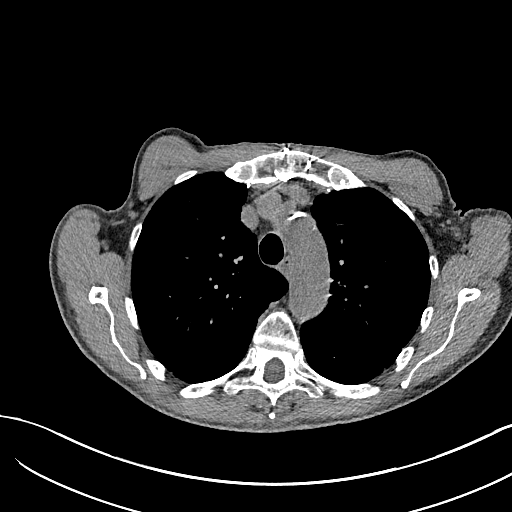
[im 108/156  lung]
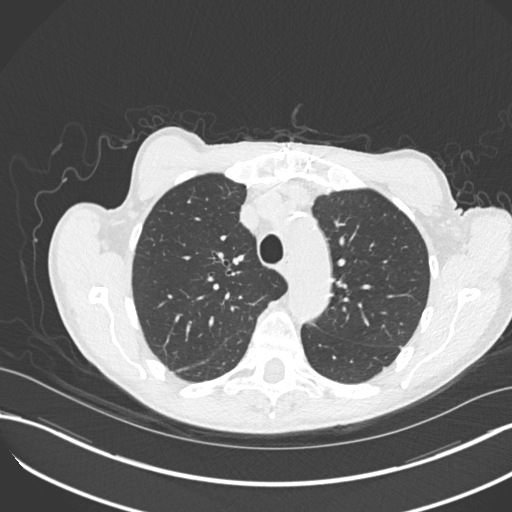
[im 120/156  lung]
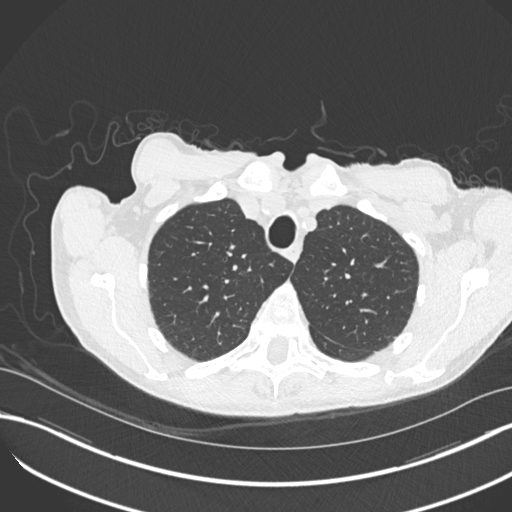
[im 132/156  lung]
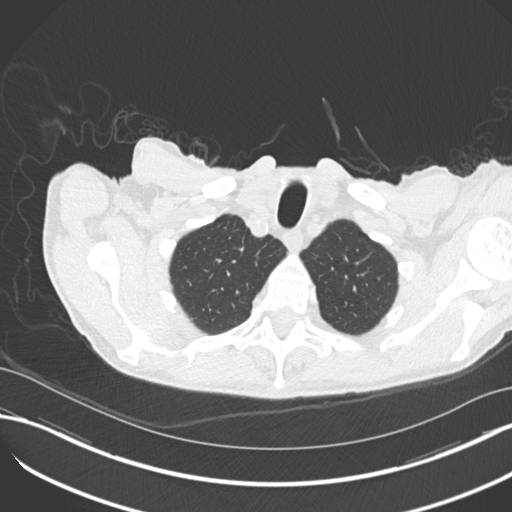
[im 144/156  lung]
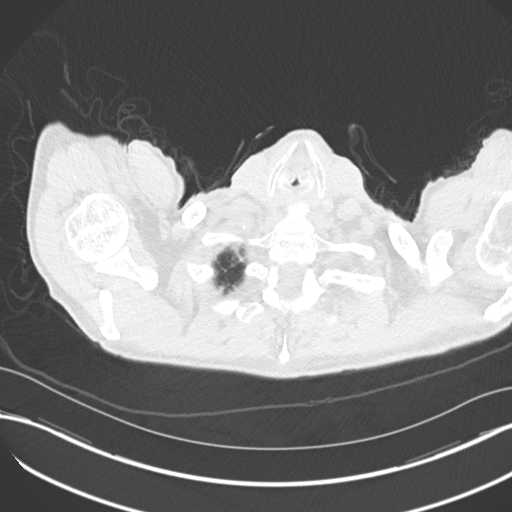

[Series 5: coronal · coronal · 0.63mm/px · 3 of 135 slices shown]
[im 27/135  lung]
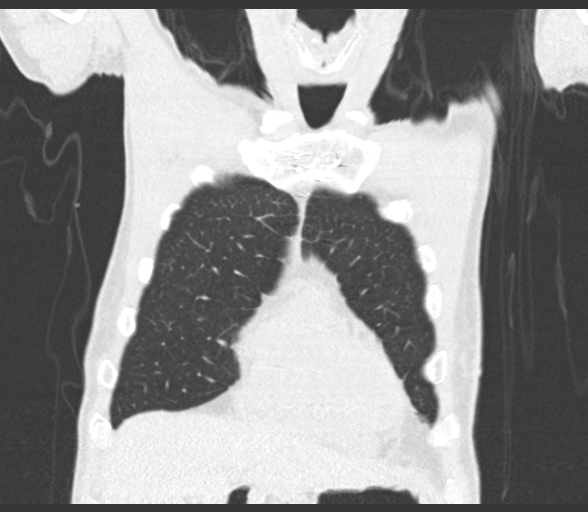
[im 54/135  lung]
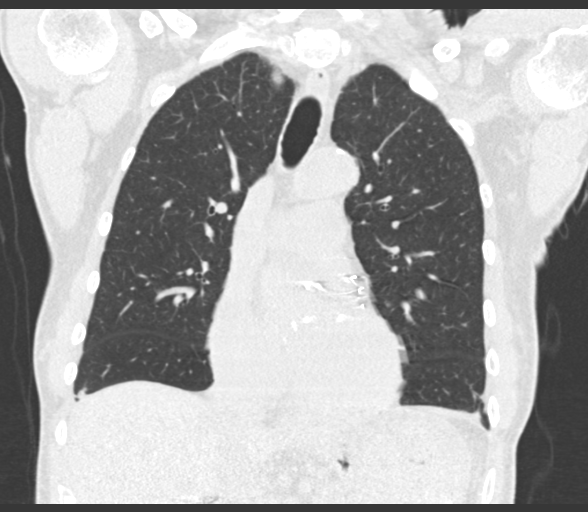
[im 81/135  lung]
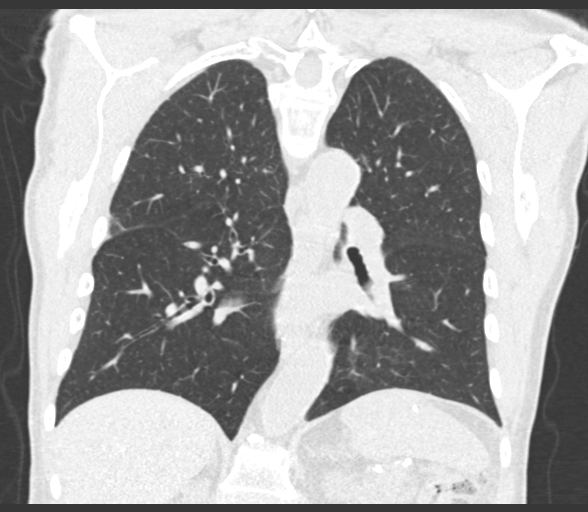

[15 of 36 positions shown; findings below may reference images not displayed]

FINDINGS: Cardiovascular: No significant vascular findings. Normal heart size.
No pericardial effusion. Prior CABG. No thoracic aortic aneurysm.
Coronary, aortic arch, and branch vessel atherosclerotic vascular
disease.

Mediastinum/Nodes: No enlarged mediastinal or axillary lymph nodes.
Thyroid gland, trachea, and esophagus demonstrate no significant
findings.

Lungs/Pleura: There are three subpleural nodules in the peripheral
anterior right lower lobe measuring up to 6 mm in mean diameter,
likely nodular scarring. Similar though slightly less prominent
appearance in the peripheral anterior left lower lobe. No suspicious
pulmonary nodule. No focal consolidation, pleural effusion, or
pneumothorax.

Upper Abdomen: No acute abnormality.  Prior cholecystectomy.

Musculoskeletal: No chest wall mass or suspicious bone lesions
identified.
IMPRESSION: 1. No evidence of metastatic disease in the chest.
2. Mild scarring in both anterior lower lobes.
3. Aortic Atherosclerosis ([QX]-[QX]).

## 2021-08-23 MED ORDER — GADOBUTROL 1 MMOL/ML IV SOLN
8.0000 mL | Freq: Once | INTRAVENOUS | Status: AC | PRN
  Administered 2021-08-23: 14:00:00 8 mL via INTRAVENOUS

## 2021-08-24 ENCOUNTER — Encounter: Payer: Self-pay | Admitting: Rehabilitation

## 2021-08-24 ENCOUNTER — Ambulatory Visit: Payer: Medicare Other | Admitting: Rehabilitation

## 2021-08-24 DIAGNOSIS — M25661 Stiffness of right knee, not elsewhere classified: Secondary | ICD-10-CM

## 2021-08-24 DIAGNOSIS — M79605 Pain in left leg: Secondary | ICD-10-CM

## 2021-08-24 DIAGNOSIS — R262 Difficulty in walking, not elsewhere classified: Secondary | ICD-10-CM

## 2021-08-24 DIAGNOSIS — M25662 Stiffness of left knee, not elsewhere classified: Secondary | ICD-10-CM

## 2021-08-24 DIAGNOSIS — I890 Lymphedema, not elsewhere classified: Secondary | ICD-10-CM

## 2021-08-24 DIAGNOSIS — M6281 Muscle weakness (generalized): Secondary | ICD-10-CM

## 2021-08-24 NOTE — Therapy (Signed)
[place] @ [place], [place], [address] Phone: [phone]   Fax:  [phone]  Physical Therapy Treatment  Patient Details  Name: Joseph Werner MRN: [id number] Date of Birth: 06-29-1935 Referring Provider (PT): Shona Simpson   Encounter Date: 08/24/2021   PT End of Session - 08/24/21 1356     Visit Number 37    Number of Visits 40    Date for PT Re-Evaluation 09/01/21    PT Start Time 1300    PT Stop Time 5427    PT Time Calculation (min) 55 min    Activity Tolerance Patient tolerated treatment well    Behavior During Therapy [facility] for tasks assessed/performed             Past Medical History:  Diagnosis Date   Anemia    macrocytic anemia   Arthritis    Bradycardia    chronic   CAD (coronary artery disease)    Dyslipidemia    Eczema    Hyperlipidemia    Hypothyroid    Loss of weight    Macrocytosis    with normal bone marrow biopsy in 2006 may be benign varient, evaluated in the pastby hematologistDr. Benay Spice   Osteopenia    on bone density test from orthopedist Dr. Nelva Bush, bone density test in July 2010 showed T score-1.1 in the lumbar spine and 2.1 in the femur. Bone density in August 2012 showed T score 2.3 in the femur and 2.2 in the forearm   Postsurgical aortocoronary bypass status    Prostate cancer [facility])    H/O   Prostate cancer [facility])    followed by urology Dr. Diona Fanti    Past Surgical History:  Procedure Laterality Date   [place] Right 11/18/2012   Procedure: HERNIA REPAIR INGUINAL ADULT;  Surgeon: Merrie Roof, MD;  Location: [place];  Service: General;  Laterality: Right;   INSERTION OF MESH Right 11/18/2012   Procedure: INSERTION OF MESH;  Surgeon: Merrie Roof, MD;  Location: [place];  Service: General;  Laterality: Right;   PROSTATECTOMY      There were  no vitals filed for this visit.   Subjective Assessment - 08/24/21 1304     Subjective nothing new    Pertinent History L thigh sarcoma, currently undergoing radiation, underwent sarcoma removal surgery on 04/11/21, heart bypass surgery, gallbladder removed, prostate cancer    Currently in Pain? No/denies                               [facility] Adult PT Treatment/Exercise - 08/24/21 0001       Knee/Hip Exercises: Aerobic   Nustep Level 5 x 15 min for ROM with arms at 11 and seat at 13      Knee/Hip Exercises: Standing   Lateral Step Up Left;1 set;10 reps;Hand Hold: 2;Step Height: 6"    Forward Step Up Left;1 set;10 reps;Hand Hold: 2;Step Height: 6"    Other Standing Knee Exercises mini squats with in // bars with v/c not to rely too heavily on bars for support    Other Standing Knee Exercises 3 way hip in parallel bars 2# x 10 each      Knee/Hip Exercises: Seated   Heel Slides Limitations both  feet on large ball AAROM into flexion      Knee/Hip Exercises: Supine   Quad Sets Strengthening;Left;1 set;10 reps    Short Arc Quad Sets Strengthening;Left;2 sets;10 reps    Short Arc Quad Sets Limitations 2#    Straight Leg Raises Strengthening;Left;10 reps;2 sets    Straight Leg Raises Limitations 2#    Other Supine Knee/Hip Exercises hooklying ball squeeze 5" x 10 with foot assist                          PT Long Term Goals - 08/18/21 1015       PT LONG TERM GOAL #4   Title Pt will report a 50% improvement in overall pain in Lt LE to allow improved comfort.    Baseline No Lt leg    Status Achieved      PT LONG TERM GOAL #6   Title Pt will demonstrate ability to sit to stand from a chair without use of UEs    Baseline unable; 08/04/21- still requires use of UEs but is able to bend his knee further back to assist    Status On-going      PT LONG TERM GOAL #8   Title Pt will be able to ambulate without need for a front wheeled walker to decrease  reliance on assistive device    Baseline No longer using assistive device    Status Achieved                   Plan - 08/24/21 1356     Clinical Impression Statement Pt has improved since last visit with this PT.  Had CT scan of the leg yesterday but has not heard anything back about findings.    PT Frequency 2x / week    PT Duration 6 weeks    PT Treatment/Interventions ADLs/Self Care Home Management;Therapeutic exercise;Therapeutic activities;Patient/family education;Manual techniques;Manual lymph drainage;Compression bandaging;Taping;Vasopneumatic Device;Gait training;Stair training;Passive range of motion;Scar mobilization;Joint Manipulations    PT Next Visit Plan cont ROM of bilat knees with focus on L knee, see if pt was cleared to do prone exercises             Patient will benefit from skilled therapeutic intervention in order to improve the following deficits and impairments:     Visit Diagnosis: Decreased ROM of left knee  Decreased ROM of right knee  Difficulty in walking, not elsewhere classified  Muscle weakness (generalized)  Pain in left leg  Lymphedema, not elsewhere classified     Problem List Patient Active Problem List   Diagnosis Date Noted   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

## 2021-08-26 ENCOUNTER — Ambulatory Visit: Payer: Medicare Other | Admitting: Rehabilitation

## 2021-08-26 ENCOUNTER — Encounter: Payer: Self-pay | Admitting: Rehabilitation

## 2021-08-26 ENCOUNTER — Other Ambulatory Visit: Payer: Self-pay

## 2021-08-26 DIAGNOSIS — M6281 Muscle weakness (generalized): Secondary | ICD-10-CM

## 2021-08-26 DIAGNOSIS — I890 Lymphedema, not elsewhere classified: Secondary | ICD-10-CM

## 2021-08-26 DIAGNOSIS — M25662 Stiffness of left knee, not elsewhere classified: Secondary | ICD-10-CM

## 2021-08-26 DIAGNOSIS — R262 Difficulty in walking, not elsewhere classified: Secondary | ICD-10-CM

## 2021-08-26 NOTE — Therapy (Signed)
[place] @ [place], [place], [address] Phone: [phone]   Fax:  [phone]  Physical Therapy Treatment  Patient Details  Name: Joseph Werner MRN: [id number] Date of Birth: [birth year] Referring Provider (PT): Shona Simpson   Encounter Date: 08/26/2021   PT End of Session - 08/26/21 1123     Visit Number 50    Number of Visits 45    Date for PT Re-Evaluation 09/23/21    Progress Note Due on Visit 99    PT Start Time 1100    PT Stop Time 1153    PT Time Calculation (min) 53 min    Activity Tolerance Patient tolerated treatment well    Behavior During Therapy [facility] for tasks assessed/performed             Past Medical History:  Diagnosis Date   Anemia    macrocytic anemia   Arthritis    Bradycardia    chronic   CAD (coronary artery disease)    Dyslipidemia    Eczema    Hyperlipidemia    Hypothyroid    Loss of weight    Macrocytosis    with normal bone marrow biopsy in 2006 may be benign varient, evaluated in the pastby hematologistDr. Benay Spice   Osteopenia    on bone density test from orthopedist Dr. Nelva Bush, bone density test in July 2010 showed T score-1.1 in the lumbar spine and 2.1 in the femur. Bone density in August 2012 showed T score 2.3 in the femur and 2.2 in the forearm   Postsurgical aortocoronary bypass status    Prostate cancer [facility])    H/O   Prostate cancer [facility])    followed by urology Dr. Diona Fanti    Past Surgical History:  Procedure Laterality Date   [place] Right 11/18/2012   Procedure: HERNIA REPAIR INGUINAL ADULT;  Surgeon: Merrie Roof, MD;  Location: [place];  Service: General;  Laterality: Right;   INSERTION OF MESH Right 11/18/2012   Procedure: INSERTION OF MESH;  Surgeon: Merrie Roof, MD;  Location: [place];  Service: General;  Laterality: Right;    PROSTATECTOMY      There were no vitals filed for this visit.   Subjective Assessment - 08/26/21 1057     Subjective nothing new.  They sent me a report about the imaging but I couldn't understand it.  I talk to them on 08/31/21.    Pertinent History L thigh sarcoma, currently undergoing radiation, underwent sarcoma removal surgery on 04/11/21, heart bypass surgery, gallbladder removed, prostate cancer    Currently in Pain? No/denies                [facility] PT Assessment - 08/26/21 0001       AROM   Left Knee Extension -10   in seated lacking in LAQ; also in SAQ position   Left Knee Flexion 76                           [facility] Adult PT Treatment/Exercise - 08/26/21 0001       Knee/Hip Exercises: Stretches   Passive Hamstring Stretch Both;2 reps;30 seconds    Other Knee/Hip Stretches Stand to sit with progressing knee flexion (PT stabilizing foot position while patient descends) x  8 min at varying angles onto elevated surface with step      Knee/Hip Exercises: Aerobic   Nustep Level 5 x 15 min for ROM with arms at 11 and seat at 13      Knee/Hip Exercises: Standing   Forward Step Up Limitations 6" step x 10 and then 8" step x 5 which was not too difficult    Other Standing Knee Exercises mini squats with in // bars with v/c not to rely too heavily on bars for support    Other Standing Knee Exercises 3 way hip in parallel bars 2# x 10 each      Knee/Hip Exercises: Supine   Short Arc Quad Sets Strengthening;Left;2 sets;10 reps    Short Arc Quad Sets Limitations 2#    Heel Slides Limitations 6" hold with stretch strap for AAROM both feet propped on the ball with PT assist into more flexion    Terminal Knee Extension Limitations 5" press into the mat    Straight Leg Raises Strengthening;Left;10 reps;2 sets    Straight Leg Raises Limitations 2#    Other Supine Knee/Hip Exercises hooklying ball squeeze 5" x 10 with foot assist                           PT Long Term Goals - 08/26/21 1129       PT LONG TERM GOAL #1   Title Pt and/or spouse will be independent in self MLD for long term management of lymphedema.    Status Deferred      PT LONG TERM GOAL #2   Title Pt will obtain appropriate compression garments for long term management of lymphedema.    Status Achieved      PT LONG TERM GOAL #3   Title Pt will demonstrate a 3 cm decrease in circumference at 20 cm proximal to L suprapatella to decrease risk of infection.    Status Achieved      PT LONG TERM GOAL #4   Title Pt will report a 50% improvement in overall pain in Lt LE to allow improved comfort.    Status Achieved      PT LONG TERM GOAL #5   Title Pt will demonstrate 120 degrees of bilateral knee flexion to allow pt to stand from a chair    Status On-going      PT LONG TERM GOAL #6   Title Pt will demonstrate ability to sit to stand from a chair without use of UEs    Status On-going      PT LONG TERM GOAL #7   Title Pt will be independent in a home exercise program for continued strengthening and stretching    Status On-going      PT LONG TERM GOAL #8   Title Pt will be able to ambulate without need for a front wheeled walker to decrease reliance on assistive device    Status Achieved                   Plan - 08/26/21 1126     Clinical Impression Statement Remeasured AROM which remains unchanged from last measurement.  Extended POC x 4 weeks to maximize ROM and functional gains    Stability/Clinical Decision Making Stable/Uncomplicated    Clinical Decision Making Low    Rehab Potential Good    PT Frequency 2x / week    PT Duration 4 weeks    PT Treatment/Interventions ADLs/Self Care  Home Management;Therapeutic exercise;Therapeutic activities;Patient/family education;Manual techniques;Manual lymph drainage;Compression bandaging;Taping;Vasopneumatic Device;Gait training;Stair training;Passive range of motion;Scar  mobilization;Joint Manipulations    PT Next Visit Plan cont ROM of bilat knees with focus on L knee, see if pt was cleared to do prone exercises    Consulted and Agree with Plan of Care Patient             Patient will benefit from skilled therapeutic intervention in order to improve the following deficits and impairments:  Pain, Postural dysfunction, Increased fascial restricitons, Decreased strength, Decreased range of motion, Decreased scar mobility, Increased edema, Difficulty walking, Decreased skin integrity, Decreased balance  Visit Diagnosis: Decreased ROM of left knee - Plan: PT plan of care cert/re-cert  Difficulty in walking, not elsewhere classified - Plan: PT plan of care cert/re-cert  Muscle weakness (generalized) - Plan: PT plan of care cert/re-cert  Lymphedema, not elsewhere classified - Plan: PT plan of care cert/re-cert     Problem List Patient Active Problem List   Diagnosis Date Noted   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

## 2021-08-30 ENCOUNTER — Ambulatory Visit: Payer: Medicare Other | Attending: Orthopedic Surgery | Admitting: Physical Therapy

## 2021-08-30 ENCOUNTER — Other Ambulatory Visit: Payer: Self-pay

## 2021-08-30 ENCOUNTER — Encounter: Payer: Self-pay | Admitting: Physical Therapy

## 2021-08-30 DIAGNOSIS — M6281 Muscle weakness (generalized): Secondary | ICD-10-CM | POA: Insufficient documentation

## 2021-08-30 DIAGNOSIS — M25662 Stiffness of left knee, not elsewhere classified: Secondary | ICD-10-CM | POA: Insufficient documentation

## 2021-08-30 DIAGNOSIS — R262 Difficulty in walking, not elsewhere classified: Secondary | ICD-10-CM | POA: Diagnosis present

## 2021-08-30 DIAGNOSIS — M79605 Pain in left leg: Secondary | ICD-10-CM | POA: Diagnosis present

## 2021-08-30 DIAGNOSIS — M25661 Stiffness of right knee, not elsewhere classified: Secondary | ICD-10-CM | POA: Diagnosis present

## 2021-08-30 NOTE — Therapy (Signed)
[facility] @ [place], [place], [address] Phone: [phone]   Fax:  [phone]  Physical Therapy Treatment  Patient Details  Name: Joseph Werner MRN: [id number] Date of Birth: Nov 27, 1934 Referring Provider (PT): Shona Simpson   Encounter Date: 08/30/2021   PT End of Session - 08/30/21 0951     Visit Number 43    Number of Visits 45    Date for PT Re-Evaluation 09/23/21    Authorization Type begin kx after visit 55(equal to visit 15 in the new year)    PT Start Time 0852    PT Stop Time 0947    PT Time Calculation (min) 55 min    Activity Tolerance Patient tolerated treatment well    Behavior During Therapy [facility] for tasks assessed/performed             Past Medical History:  Diagnosis Date   Anemia    macrocytic anemia   Arthritis    Bradycardia    chronic   CAD (coronary artery disease)    Dyslipidemia    Eczema    Hyperlipidemia    Hypothyroid    Loss of weight    Macrocytosis    with normal bone marrow biopsy in 2006 may be benign varient, evaluated in the pastby hematologistDr. Benay Spice   Osteopenia    on bone density test from orthopedist Dr. Nelva Bush, bone density test in July 2010 showed T score-1.1 in the lumbar spine and 2.1 in the femur. Bone density in August 2012 showed T score 2.3 in the femur and 2.2 in the forearm   Postsurgical aortocoronary bypass status    Prostate cancer [facility])    H/O   Prostate cancer [facility])    followed by urology Dr. Diona Fanti    Past Surgical History:  Procedure Laterality Date   [place] Right 11/18/2012   Procedure: HERNIA REPAIR INGUINAL ADULT;  Surgeon: Merrie Roof, MD;  Location: [place];  Service: General;  Laterality: Right;   INSERTION OF MESH Right 11/18/2012   Procedure: INSERTION OF MESH;  Surgeon: Merrie Roof, MD;  Location:  [place];  Service: General;  Laterality: Right;   PROSTATECTOMY      There were no vitals filed for this visit.   Subjective Assessment - 08/30/21 0854     Subjective I am not able to drive yet but I am able to get in the drivers seat and it is more tolerable. I have another cataract surgery this coming Thursday.    Pertinent History L thigh sarcoma, currently undergoing radiation, underwent sarcoma removal surgery on 04/11/21, heart bypass surgery, gallbladder removed, prostate cancer    Patient Stated Goals to wake up one morning without any pain and be able to walk across the room like nothing happened    Currently in Pain? No/denies    Pain Score 0-No pain                               [facility] Adult PT Treatment/Exercise - 08/30/21 0001       Knee/Hip Exercises: Stretches   Passive Hamstring Stretch Left;2 reps;60 seconds    Quad Stretch Left;60 seconds;2 reps   in prone     Knee/Hip Exercises: Aerobic  Nustep Level 5 x 15 min for ROM with arms at 11 and seat at 13      Knee/Hip Exercises: Standing   Knee Flexion Left;1 set;10 reps    Lateral Step Up Left;1 set;10 reps;Step Height: 8";Hand Hold: 2    Lateral Step Up Limitations some difficulty clearing step    Forward Step Up Limitations 8" step x 10 which pt had mild difficulty clearing occasionally    Other Standing Knee Exercises mini squats with in // bars with v/c not to rely too heavily on bars for support    Other Standing Knee Exercises 3 way hip in parallel bars 2# x 10 each      Knee/Hip Exercises: Seated   Heel Slides Limitations 60" x 2 AAROM      Knee/Hip Exercises: Supine   Quad Sets Strengthening;Left;1 set;10 reps   10 sec hold; with towel rolled up under ankle   Short Arc Quad Sets Strengthening;Left;2 sets;10 reps   with 5 sec holds   Short Arc Quad Sets Limitations 2#    Straight Leg Raises Strengthening;Left;10 reps;2 sets    Straight Leg Raises Limitations 2#    Other Supine Knee/Hip  Exercises hooklying ball squeeze 5" x 10      Manual Therapy   Passive ROM to L knee in to flexion and to hip in to flexion to stretch quads and hamstrings                          PT Long Term Goals - 08/26/21 1129       PT LONG TERM GOAL #1   Title Pt and/or spouse will be independent in self MLD for long term management of lymphedema.    Status Deferred      PT LONG TERM GOAL #2   Title Pt will obtain appropriate compression garments for long term management of lymphedema.    Status Achieved      PT LONG TERM GOAL #3   Title Pt will demonstrate a 3 cm decrease in circumference at 20 cm proximal to L suprapatella to decrease risk of infection.    Status Achieved      PT LONG TERM GOAL #4   Title Pt will report a 50% improvement in overall pain in Lt LE to allow improved comfort.    Status Achieved      PT LONG TERM GOAL #5   Title Pt will demonstrate 120 degrees of bilateral knee flexion to allow pt to stand from a chair    Status On-going      PT LONG TERM GOAL #6   Title Pt will demonstrate ability to sit to stand from a chair without use of UEs    Status On-going      PT LONG TERM GOAL #7   Title Pt will be independent in a home exercise program for continued strengthening and stretching    Status On-going      PT LONG TERM GOAL #8   Title Pt will be able to ambulate without need for a front wheeled walker to decrease reliance on assistive device    Status Achieved                   Plan - 08/30/21 0952     Clinical Impression Statement Continued with LE strengthening and ROM to improve bilateral knee flexion. Pt reports he will see the doctor tomorrow to discuss results of recent imaging. Pt  is still having difficulty standing from a seated position due to limited knee mobility. He relies heavily on UE support to stand.    PT Frequency 2x / week    PT Duration 4 weeks    PT Treatment/Interventions ADLs/Self Care Home  Management;Therapeutic exercise;Therapeutic activities;Patient/family education;Manual techniques;Manual lymph drainage;Compression bandaging;Taping;Vasopneumatic Device;Gait training;Stair training;Passive range of motion;Scar mobilization;Joint Manipulations    PT Next Visit Plan cont ROM of bilat knees with focus on L knee, see if pt was cleared to do prone exercises    PT Home Exercise Plan Access Code: [email or web address]    Consulted and Agree with Plan of Care Patient    Family Member Consulted wife             Patient will benefit from skilled therapeutic intervention in order to improve the following deficits and impairments:  Pain, Postural dysfunction, Increased fascial restricitons, Decreased strength, Decreased range of motion, Decreased scar mobility, Increased edema, Difficulty walking, Decreased skin integrity, Decreased balance  Visit Diagnosis: Decreased ROM of left knee  Difficulty in walking, not elsewhere classified  Muscle weakness (generalized)     Problem List Patient Active Problem List   Diagnosis Date Noted   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

## 2021-09-06 ENCOUNTER — Other Ambulatory Visit: Payer: Self-pay

## 2021-09-06 ENCOUNTER — Ambulatory Visit: Payer: Medicare Other

## 2021-09-06 DIAGNOSIS — M25662 Stiffness of left knee, not elsewhere classified: Secondary | ICD-10-CM

## 2021-09-06 DIAGNOSIS — M6281 Muscle weakness (generalized): Secondary | ICD-10-CM

## 2021-09-06 DIAGNOSIS — R262 Difficulty in walking, not elsewhere classified: Secondary | ICD-10-CM

## 2021-09-06 NOTE — Therapy (Signed)
[facility] @ [place], [place], [address] Phone: [phone]   Fax:  [phone]  Physical Therapy Treatment  Patient Details  Name: Joseph Werner MRN: [id number] Date of Birth: 04-21-1935 Referring Provider (Joseph Werner): Shona Simpson   Encounter Date: 09/06/2021 Progress Note Reporting Period 08/02/21 to 09/06/21  See note below for Objective Data and Assessment of Progress/Goals.      Joseph Werner End of Session - 09/06/21 1155     Visit Number 40    Date for Joseph Werner Re-Evaluation 09/23/21    Authorization Type begin kx after visit 55(equal to visit 15 in the new year)    Progress Note Due on Visit 23    Joseph Werner Start Time 1104    Joseph Werner Stop Time 1148    Joseph Werner Time Calculation (min) 44 min    Activity Tolerance Patient tolerated treatment well    Behavior During Therapy WFL for tasks assessed/performed             Past Medical History:  Diagnosis Date   Anemia    macrocytic anemia   Arthritis    Bradycardia    chronic   CAD (coronary artery disease)    Dyslipidemia    Eczema    Hyperlipidemia    Hypothyroid    Loss of weight    Macrocytosis    with normal bone marrow biopsy in 2006 may be benign varient, evaluated in the pastby hematologistDr. Benay Spice   Osteopenia    on bone density test from orthopedist Dr. Nelva Bush, bone density test in July 2010 showed T score-1.1 in the lumbar spine and 2.1 in the femur. Bone density in August 2012 showed T score 2.3 in the femur and 2.2 in the forearm   Postsurgical aortocoronary bypass status    Prostate cancer [facility])    H/O   Prostate cancer [facility])    followed by urology Dr. Diona Fanti    Past Surgical History:  Procedure Laterality Date   [place] Right 11/18/2012   Procedure: HERNIA REPAIR INGUINAL ADULT;  Surgeon: Merrie Roof, MD;  Location: [place];  Service:  General;  Laterality: Right;   INSERTION OF MESH Right 11/18/2012   Procedure: INSERTION OF MESH;  Surgeon: Merrie Roof, MD;  Location: [place];  Service: General;  Laterality: Right;   PROSTATECTOMY      There were no vitals filed for this visit.   Subjective Assessment - 09/06/21 1058     Subjective I'm getting in/out of the car with increased ease    Pertinent History L thigh sarcoma, currently undergoing radiation, underwent sarcoma removal surgery on 04/11/21, heart bypass surgery, gallbladder removed, prostate cancer    Currently in Pain? No/denies                [facility] Joseph Werner Assessment - 09/06/21 0001       Assessment   Medical Diagnosis L thigh sarcoma    Referring Provider (Joseph Werner) Shona Simpson    Onset Date/Surgical Date 04/11/21    Hand Dominance Right      Prior Function   Level of Independence Independent with household mobility with device      Cognition   Overall Cognitive Status Within Functional Limits for tasks assessed      ROM / Strength   AROM / PROM /  Strength PROM      AROM   Left Knee Extension -10      PROM   PROM Assessment Site Knee    Right/Left Knee Left    Left Knee Extension -6    Left Knee Flexion 90                           [facility] Adult Joseph Werner Treatment/Exercise - 09/06/21 0001       Knee/Hip Exercises: Stretches   Passive Hamstring Stretch Left;2 reps;60 seconds    Other Knee/Hip Stretches foot on 8" step stool: static knee flexion stretch 20" hold x 8      Knee/Hip Exercises: Aerobic   Nustep Level 6 x 15 min for ROM with arms at 11 and seat at 13      Knee/Hip Exercises: Standing   Knee Flexion Strengthening;Both;2 sets;10 reps    Knee Flexion Limitations alternating to tap on orange cone with min UE support, wearing 3# ankle weights    Hip Abduction Stengthening;Left;10 reps;Knee straight;2 sets   in // bars without using UEs   Abduction Limitations 3# ankle weights    Lateral Step Up --    Lateral Step Up  Limitations --    Forward Step Up Limitations --    Other Standing Knee Exercises 3 way hip in parallel bars 2# x 10 each      Manual Therapy   Manual Therapy Passive ROM;Joint mobilization    Joint Mobilization patellar mobs-superior/inferior and tibial mobs anterior/posterior    Passive ROM supine stretch into extension with overpressure, flexion in prone with overpressure                          Joseph Werner Long Term Goals - 09/06/21 1158       Joseph Werner LONG TERM GOAL #1   Title Joseph Werner and/or spouse will be independent in self MLD for long term management of lymphedema.    Status On-going      Joseph Werner LONG TERM GOAL #2   Title Joseph Werner will obtain appropriate compression garments for long term management of lymphedema.    Status Achieved      Joseph Werner LONG TERM GOAL #4   Title Joseph Werner will report a 50% improvement in overall pain in Lt LE to allow improved comfort.    Status Achieved      Joseph Werner LONG TERM GOAL #5   Title Joseph Werner will demonstrate 120 degrees of bilateral knee flexion to allow Joseph Werner to stand from a chair    Baseline Lt P/ROM 90 today,    Status On-going      Joseph Werner LONG TERM GOAL #6   Title Joseph Werner will demonstrate ability to sit to stand from a chair without use of UEs    Baseline still requires use of UEs but is able to bend his knee further back to assist, improved ease with this and improved controlled descent with stand to sit    Status On-going      Joseph Werner LONG TERM GOAL #7   Title Joseph Werner will be independent in a home exercise program for continued strengthening and stretching    Status On-going      Joseph Werner LONG TERM GOAL #8   Title Joseph Werner will be able to ambulate without need for a front wheeled walker to decrease reliance on assistive device    Baseline No longer using assistive device    Status Achieved  Plan - 09/06/21 1154     Clinical Impression Statement Continued with LE strengthening and ROM to Lt knee flexion. Joseph Werner received imaging and everything was good. Joseph Werner  demonstrates substitutions and difficulty with mobility associated with reduced Lt knee flexion.  Joseph Werner with improved patellar mobility after manual therapy and improved P/ROM into flexion after treatment.  Joseph Werner with good tolerance for knee flexion P/ROM.  Joseph Werner will continue to benefit from skilled Joseph Werner to address Lt knee ROM, balance and endurance to improve functional mobility.    Joseph Werner Frequency 2x / week    Joseph Werner Duration 4 weeks    Joseph Werner Treatment/Interventions ADLs/Self Care Home Management;Therapeutic exercise;Therapeutic activities;Patient/family education;Manual techniques;Manual lymph drainage;Compression bandaging;Taping;Vasopneumatic Device;Gait training;Stair training;Passive range of motion;Scar mobilization;Joint Manipulations    Joseph Werner Next Visit Plan cont ROM of bilat knees with focus on Lt  knee, balance, strength, endurance    Joseph Werner Home Exercise Plan Access Code: [email or web address]    Consulted and Agree with Plan of Care Patient             Patient will benefit from skilled therapeutic intervention in order to improve the following deficits and impairments:  Pain, Postural dysfunction, Increased fascial restricitons, Decreased strength, Decreased range of motion, Decreased scar mobility, Increased edema, Difficulty walking, Decreased skin integrity, Decreased balance  Visit Diagnosis: Decreased ROM of left knee  Difficulty in walking, not elsewhere classified  Muscle weakness (generalized)     Problem List Patient Active Problem List   Diagnosis Date Noted   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

## 2021-09-08 ENCOUNTER — Other Ambulatory Visit: Payer: Self-pay

## 2021-09-08 ENCOUNTER — Ambulatory Visit: Payer: Medicare Other

## 2021-09-08 DIAGNOSIS — M25662 Stiffness of left knee, not elsewhere classified: Secondary | ICD-10-CM | POA: Diagnosis not present

## 2021-09-08 DIAGNOSIS — R262 Difficulty in walking, not elsewhere classified: Secondary | ICD-10-CM

## 2021-09-08 DIAGNOSIS — M6281 Muscle weakness (generalized): Secondary | ICD-10-CM

## 2021-09-08 NOTE — Therapy (Signed)
[facility] @ [place], [place], [address] Phone: [phone]   Fax:  [phone]  Physical Therapy Treatment  Patient Details  Name: Joseph Werner MRN: [id number] Date of Birth: May 13, 1935 Referring Provider (PT): Shona Simpson   Encounter Date: 09/08/2021   PT End of Session - 09/08/21 1144     Visit Number 56    Date for PT Re-Evaluation 09/23/21    Authorization Type begin kx after visit 55(equal to visit 41 in the new year)    Progress Note Due on Visit 42    PT Start Time 1102    PT Stop Time 1144    PT Time Calculation (min) 42 min    Activity Tolerance Patient tolerated treatment well    Behavior During Therapy [facility] for tasks assessed/performed             Past Medical History:  Diagnosis Date   Anemia    macrocytic anemia   Arthritis    Bradycardia    chronic   CAD (coronary artery disease)    Dyslipidemia    Eczema    Hyperlipidemia    Hypothyroid    Loss of weight    Macrocytosis    with normal bone marrow biopsy in 2006 may be benign varient, evaluated in the pastby hematologistDr. Benay Spice   Osteopenia    on bone density test from orthopedist Dr. Nelva Bush, bone density test in July 2010 showed T score-1.1 in the lumbar spine and 2.1 in the femur. Bone density in August 2012 showed T score 2.3 in the femur and 2.2 in the forearm   Postsurgical aortocoronary bypass status    Prostate cancer [facility])    H/O   Prostate cancer [facility])    followed by urology Dr. Diona Fanti    Past Surgical History:  Procedure Laterality Date   [place] Right 11/18/2012   Procedure: HERNIA REPAIR INGUINAL ADULT;  Surgeon: Merrie Roof, MD;  Location: [place];  Service: General;  Laterality: Right;   INSERTION OF MESH Right 11/18/2012   Procedure: INSERTION OF MESH;  Surgeon: Merrie Roof, MD;   Location: [place];  Service: General;  Laterality: Right;   PROSTATECTOMY      There were no vitals filed for this visit.   Subjective Assessment - 09/08/21 1102     Subjective No pain.  My knee wasn't too sore after last session.    Pertinent History L thigh sarcoma, currently undergoing radiation, underwent sarcoma removal surgery on 04/11/21, heart bypass surgery, gallbladder removed, prostate cancer    Patient Stated Goals to wake up one morning without any pain and be able to walk across the room like nothing happened    Currently in Pain? No/denies                               [facility] Adult PT Treatment/Exercise - 09/08/21 0001       Knee/Hip Exercises: Stretches   Other Knee/Hip Stretches foot on 8" step stool: static knee flexion stretch 20" hold x 8      Knee/Hip Exercises: Aerobic   Nustep Level 6 x 50mn for ROM with arms at 11 and seat at 13      Knee/Hip Exercises: Standing  Knee Flexion Strengthening;Both;2 sets;10 reps    Knee Flexion Limitations alternating to tap on 8" step stool with min UE support, wearing 3# ankle weights    Hip Abduction Stengthening;Left;10 reps;Knee straight;2 sets   in // bars without using UEs   Abduction Limitations 3# ankle weights    Hip Extension Stengthening;Both;2 sets;10 reps;Knee straight    Extension Limitations 3# added      Knee/Hip Exercises: Seated   Heel Slides Limitations 20" x 10 using green strap    Hamstring Curl Strengthening;Left;2 sets;10 reps;Limitations    Hamstring Limitations green      Manual Therapy   Manual Therapy Passive ROM;Joint mobilization    Joint Mobilization patellar mobs-superior/inferior and tibial mobs anterior/posterior    Passive ROM supine stretch into extension with overpressure, flexion in prone with overpressure                          PT Long Term Goals - 09/06/21 1158       PT LONG TERM GOAL #1   Title Pt and/or spouse will be independent in self  MLD for long term management of lymphedema.    Status On-going      PT LONG TERM GOAL #2   Title Pt will obtain appropriate compression garments for long term management of lymphedema.    Status Achieved      PT LONG TERM GOAL #4   Title Pt will report a 50% improvement in overall pain in Lt LE to allow improved comfort.    Status Achieved      PT LONG TERM GOAL #5   Title Pt will demonstrate 120 degrees of bilateral knee flexion to allow pt to stand from a chair    Baseline Lt P/ROM 90 today,    Status On-going      PT LONG TERM GOAL #6   Title Pt will demonstrate ability to sit to stand from a chair without use of UEs    Baseline still requires use of UEs but is able to bend his knee further back to assist, improved ease with this and improved controlled descent with stand to sit    Status On-going      PT LONG TERM GOAL #7   Title Pt will be independent in a home exercise program for continued strengthening and stretching    Status On-going      PT LONG TERM GOAL #8   Title Pt will be able to ambulate without need for a front wheeled walker to decrease reliance on assistive device    Baseline No longer using assistive device    Status Achieved                   Plan - 09/08/21 1106     Clinical Impression Statement Continued with LE strengthening and ROM to Lt knee flexion. Pt had improvement in Lt knee ROM last session. Pt demonstrates substitutions and difficulty with mobility associated with reduced Lt knee flexion.  Pt with improved patellar mobility and tibial mobility which has contributed to improved overall knee mobility.  Pt with good tolerance for knee flexion P/ROM.  Pt requires stand by assistance for safety with balance activities. Pt will continue to benefit from skilled PT to address Lt knee ROM, balance and endurance to improve functional mobility.    PT Frequency 2x / week    PT Duration 4 weeks    PT Treatment/Interventions ADLs/Self Care Home  Management;Therapeutic  exercise;Therapeutic activities;Patient/family education;Manual techniques;Manual lymph drainage;Compression bandaging;Taping;Vasopneumatic Device;Gait training;Stair training;Passive range of motion;Scar mobilization;Joint Manipulations    PT Next Visit Plan cont ROM of bilat knees with focus on Lt  knee, balance, strength, endurance    PT Home Exercise Plan Access Code: [email or web address]    Consulted and Agree with Plan of Care Patient             Patient will benefit from skilled therapeutic intervention in order to improve the following deficits and impairments:  Pain, Postural dysfunction, Increased fascial restricitons, Decreased strength, Decreased range of motion, Decreased scar mobility, Increased edema, Difficulty walking, Decreased skin integrity, Decreased balance  Visit Diagnosis: Decreased ROM of left knee  Muscle weakness (generalized)  Difficulty in walking, not elsewhere classified     Problem List Patient Active Problem List   Diagnosis Date Noted   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

## 2021-09-13 ENCOUNTER — Encounter: Payer: Medicare Other | Admitting: Physical Therapy

## 2021-09-13 ENCOUNTER — Other Ambulatory Visit: Payer: Self-pay

## 2021-09-13 ENCOUNTER — Ambulatory Visit: Payer: Medicare Other

## 2021-09-13 DIAGNOSIS — M79605 Pain in left leg: Secondary | ICD-10-CM

## 2021-09-13 DIAGNOSIS — M25662 Stiffness of left knee, not elsewhere classified: Secondary | ICD-10-CM | POA: Diagnosis not present

## 2021-09-13 DIAGNOSIS — R262 Difficulty in walking, not elsewhere classified: Secondary | ICD-10-CM

## 2021-09-13 DIAGNOSIS — M6281 Muscle weakness (generalized): Secondary | ICD-10-CM

## 2021-09-13 NOTE — Therapy (Signed)
[facility] @ [place], [place], [address] Phone: [phone]   Fax:  [phone]  Physical Therapy Treatment  Patient Details  Name: Joseph Werner MRN: [id number] Date of Birth: 03-03-1935 Referring Provider (PT): Shona Simpson   Encounter Date: 09/13/2021   PT End of Session - 09/13/21 1156     Visit Number 42    Number of Visits 12    Date for PT Re-Evaluation 09/23/21    Authorization Type begin kx after visit 55(equal to visit 19 in the new year)    Progress Note Due on Visit 66    PT Start Time 1148    PT Stop Time 1230    PT Time Calculation (min) 42 min    Activity Tolerance Patient tolerated treatment well    Behavior During Therapy [facility] for tasks assessed/performed             Past Medical History:  Diagnosis Date   Anemia    macrocytic anemia   Arthritis    Bradycardia    chronic   CAD (coronary artery disease)    Dyslipidemia    Eczema    Hyperlipidemia    Hypothyroid    Loss of weight    Macrocytosis    with normal bone marrow biopsy in 2006 may be benign varient, evaluated in the pastby hematologistDr. Benay Spice   Osteopenia    on bone density test from orthopedist Dr. Nelva Bush, bone density test in July 2010 showed T score-1.1 in the lumbar spine and 2.1 in the femur. Bone density in August 2012 showed T score 2.3 in the femur and 2.2 in the forearm   Postsurgical aortocoronary bypass status    Prostate cancer [facility])    H/O   Prostate cancer [facility])    followed by urology Dr. Diona Fanti    Past Surgical History:  Procedure Laterality Date   [place] Right 11/18/2012   Procedure: HERNIA REPAIR INGUINAL ADULT;  Surgeon: Merrie Roof, MD;  Location: [place];  Service: General;  Laterality: Right;   INSERTION OF MESH Right 11/18/2012   Procedure: INSERTION OF MESH;   Surgeon: Merrie Roof, MD;  Location: [place];  Service: General;  Laterality: Right;   PROSTATECTOMY      There were no vitals filed for this visit.   Subjective Assessment - 09/13/21 1435     Subjective Patient is alert and oriented and denies any pain.  He states he is beginning to drive now.    Pertinent History L thigh sarcoma, currently undergoing radiation, underwent sarcoma removal surgery on 04/11/21, heart bypass surgery, gallbladder removed, prostate cancer    Patient Stated Goals to wake up one morning without any pain and be able to walk across the room like nothing happened                               [facility] Adult PT Treatment/Exercise - 09/13/21 0001       Exercises   Exercises Ankle      Knee/Hip Exercises: Stretches   Other Knee/Hip Stretches foot on 8" step stool: static knee flexion stretch 20" hold x 8      Knee/Hip Exercises: Aerobic   Nustep Level 6 x 17mn for ROM with  arms at 11 and seat at 13      Knee/Hip Exercises: Machines for Strengthening   Cybex Leg Press --      Knee/Hip Exercises: Seated   Long Arc Quad Strengthening;Left;2 sets;10 reps;Weights    Long Arc Quad Weight 5 lbs.    Heel Slides Limitations manual heel slide for knee flexion    Stool Scoot - Round Trips --    Marching --    Marching Limitations --    Marching Weights --    Hamstring Curl Strengthening;Left;2 sets;10 reps;Limitations    Hamstring Limitations green    Sit to Sand 2 sets;10 reps;without UE support;Other (comment)      Manual Therapy   Manual Therapy Passive ROM;Joint mobilization    Joint Mobilization patellar mobs-superior/inferior and tibial mobs anterior/posterior                          PT Long Term Goals - 09/06/21 1158       PT LONG TERM GOAL #1   Title Pt and/or spouse will be independent in self MLD for long term management of lymphedema.    Status On-going      PT LONG TERM GOAL #2   Title Pt will obtain  appropriate compression garments for long term management of lymphedema.    Status Achieved      PT LONG TERM GOAL #4   Title Pt will report a 50% improvement in overall pain in Lt LE to allow improved comfort.    Status Achieved      PT LONG TERM GOAL #5   Title Pt will demonstrate 120 degrees of bilateral knee flexion to allow pt to stand from a chair    Baseline Lt P/ROM 90 today,    Status On-going      PT LONG TERM GOAL #6   Title Pt will demonstrate ability to sit to stand from a chair without use of UEs    Baseline still requires use of UEs but is able to bend his knee further back to assist, improved ease with this and improved controlled descent with stand to sit    Status On-going      PT LONG TERM GOAL #7   Title Pt will be independent in a home exercise program for continued strengthening and stretching    Status On-going      PT LONG TERM GOAL #8   Title Pt will be able to ambulate without need for a front wheeled walker to decrease reliance on assistive device    Baseline No longer using assistive device    Status Achieved                   Plan - 09/13/21 1437     Clinical Impression Statement Mr. Cumbie is progressing well now.  He has started driving and is able to transfer in/out of car without assistance.  He continues to have tissue restriction but has gained functional ROM.  He was able to come to stand today without UE assist x 10 from modified sitting height.  He is flexing the knee during sit to stand and beginning to properly weight shift onto his left LE to come to stand vs. keeping the leg extended.  He would benefit from continued skilled PT to address gait and left LE strength.    Personal Factors and Comorbidities Age;Comorbidity 1    Comorbidities previous hx of prostate cancer    Examination-Activity  Limitations Stand;Sit    Examination-Participation Restrictions Driving;Yard Work;Community Activity    Stability/Clinical Decision Making  Stable/Uncomplicated    Clinical Decision Making Low    Rehab Potential Good    PT Frequency 2x / week    PT Duration 4 weeks    PT Treatment/Interventions ADLs/Self Care Home Management;Therapeutic exercise;Therapeutic activities;Patient/family education;Manual techniques;Manual lymph drainage;Compression bandaging;Taping;Vasopneumatic Device;Gait training;Stair training;Passive range of motion;Scar mobilization;Joint Manipulations    PT Next Visit Plan cont ROM of bilat knees with focus on Lt  knee, balance, strength, endurance,  sit to stand working on left knee flexion and weight shift equally on bilateral LE's as well as fwd weight shift.    PT Home Exercise Plan Access Code: [email or web address]    Consulted and Agree with Plan of Care Patient    Family Member Consulted wife             Patient will benefit from skilled therapeutic intervention in order to improve the following deficits and impairments:  Pain, Postural dysfunction, Increased fascial restricitons, Decreased strength, Decreased range of motion, Decreased scar mobility, Increased edema, Difficulty walking, Decreased skin integrity, Decreased balance  Visit Diagnosis: Decreased ROM of left knee  Muscle weakness (generalized)  Difficulty in walking, not elsewhere classified  Pain in left leg     Problem List Patient Active Problem List   Diagnosis Date Noted   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 Lamario Mani, PT 01/17/232:43 PM   [facility] @ [place], [place], [address] Phone: [phone]   Fax:  [phone]  Name: RENARD CAPERTON MRN: [id number] Date of Birth:  Jan 30, 1935

## 2021-09-13 NOTE — Patient Instructions (Signed)
Encouraged patient to begin doing reciprocal gait on steps using handrail when entering and exiting the home.

## 2021-09-15 ENCOUNTER — Ambulatory Visit: Payer: Medicare Other

## 2021-09-15 ENCOUNTER — Other Ambulatory Visit: Payer: Self-pay

## 2021-09-15 DIAGNOSIS — M6281 Muscle weakness (generalized): Secondary | ICD-10-CM

## 2021-09-15 DIAGNOSIS — M25662 Stiffness of left knee, not elsewhere classified: Secondary | ICD-10-CM | POA: Diagnosis not present

## 2021-09-15 DIAGNOSIS — R262 Difficulty in walking, not elsewhere classified: Secondary | ICD-10-CM

## 2021-09-15 DIAGNOSIS — M79605 Pain in left leg: Secondary | ICD-10-CM

## 2021-09-15 NOTE — Therapy (Signed)
[facility] @ [place], [place], [address] Phone: [phone]   Fax:  [phone]  Physical Therapy Treatment  Patient Details  Name: Joseph Werner MRN: [id number] Date of Birth: 1934-11-27 Referring Provider (PT): Shona Simpson   Encounter Date: 09/15/2021   PT End of Session - 09/15/21 1657     Visit Number 79    Number of Visits 69    Date for PT Re-Evaluation 09/23/21    Authorization Type begin kx after visit 55(equal to visit 15 in the new year)    Progress Note Due on Visit 31    PT Start Time 1145    PT Stop Time 1228    PT Time Calculation (min) 43 min    Activity Tolerance Patient tolerated treatment well    Behavior During Therapy [facility] for tasks assessed/performed             Past Medical History:  Diagnosis Date   Anemia    macrocytic anemia   Arthritis    Bradycardia    chronic   CAD (coronary artery disease)    Dyslipidemia    Eczema    Hyperlipidemia    Hypothyroid    Loss of weight    Macrocytosis    with normal bone marrow biopsy in 2006 may be benign varient, evaluated in the pastby hematologistDr. Benay Spice   Osteopenia    on bone density test from orthopedist Dr. Nelva Bush, bone density test in July 2010 showed T score-1.1 in the lumbar spine and 2.1 in the femur. Bone density in August 2012 showed T score 2.3 in the femur and 2.2 in the forearm   Postsurgical aortocoronary bypass status    Prostate cancer [facility])    H/O   Prostate cancer [facility])    followed by urology Dr. Diona Fanti    Past Surgical History:  Procedure Laterality Date   [place] Right 11/18/2012   Procedure: HERNIA REPAIR INGUINAL ADULT;  Surgeon: Merrie Roof, MD;  Location: [place];  Service: General;  Laterality: Right;   INSERTION OF MESH Right 11/18/2012   Procedure: INSERTION OF MESH;   Surgeon: Merrie Roof, MD;  Location: [place];  Service: General;  Laterality: Right;   PROSTATECTOMY      There were no vitals filed for this visit.   Subjective Assessment - 09/15/21 1151     Subjective Patient states he had some difficulty practicing the sit to stand.  He states that he just wasn't able to do this without using his hands.  Otherwise, he states, he is doing well and getting in and out of the car easier.    Pertinent History L thigh sarcoma, currently undergoing radiation, underwent sarcoma removal surgery on 04/11/21, heart bypass surgery, gallbladder removed, prostate cancer    Patient Stated Goals to wake up one morning without any pain and be able to walk across the room like nothing happened    Currently in Pain? No/denies    Pain Score 0-No pain    Pain Onset In the past 7 days                               [facility] Adult PT Treatment/Exercise - 09/15/21 0001  Knee/Hip Exercises: Stretches   Other Knee/Hip Stretches foot on 8" step stool: static knee flexion stretch 20" hold x 8      Knee/Hip Exercises: Aerobic   Nustep Level 6 x 47mn for ROM with arms at 11 and seat at 13      Knee/Hip Exercises: Standing   Knee Flexion Strengthening;Both;2 sets;10 reps   did seated hamstring curl with green band   Knee Flexion Limitations alternating to tap on 8" step stool with min UE support, wearing 3# ankle weights    Forward Step Up Left;2 sets;10 reps;Step Height: 8";Hand Hold: 1    Step Down Left;2 sets;10 reps;Hand Hold: 1;Step Height: 8"      Knee/Hip Exercises: Seated   Long Arc Quad Strengthening;Left;2 sets;10 reps;Weights    Long Arc Quad Weight 5 lbs.    Hamstring Curl Strengthening;Left;2 sets;10 reps;Limitations    Hamstring Limitations green    Sit to Sand 2 sets;10 reps;without UE support;Other (comment)                          PT Long Term Goals - 09/06/21 1158       PT LONG TERM GOAL #1   Title Pt and/or  spouse will be independent in self MLD for long term management of lymphedema.    Status On-going      PT LONG TERM GOAL #2   Title Pt will obtain appropriate compression garments for long term management of lymphedema.    Status Achieved      PT LONG TERM GOAL #4   Title Pt will report a 50% improvement in overall pain in Lt LE to allow improved comfort.    Status Achieved      PT LONG TERM GOAL #5   Title Pt will demonstrate 120 degrees of bilateral knee flexion to allow pt to stand from a chair    Baseline Lt P/ROM 90 today,    Status On-going      PT LONG TERM GOAL #6   Title Pt will demonstrate ability to sit to stand from a chair without use of UEs    Baseline still requires use of UEs but is able to bend his knee further back to assist, improved ease with this and improved controlled descent with stand to sit    Status On-going      PT LONG TERM GOAL #7   Title Pt will be independent in a home exercise program for continued strengthening and stretching    Status On-going      PT LONG TERM GOAL #8   Title Pt will be able to ambulate without need for a front wheeled walker to decrease reliance on assistive device    Baseline No longer using assistive device    Status Achieved                   Plan - 09/15/21 1657     Clinical Impression Statement Patient making steady progress but needs reminders to avoid compensating/over protecting.  He avoids left knee flexion upon coming to stand but has more ROM available.  He is tolerating sessions well and was able to do step down on 8 inch step today.  He measured his steps at home and states that they are 7.5 inches.  He should be able to do reciprocal gait on ascending and descending steps.  He would benefit from continued skilled PT for ROM and hamstring strength.  Personal Factors and Comorbidities Age;Comorbidity 1    Comorbidities previous hx of prostate cancer    Examination-Activity Limitations Stand;Sit     Examination-Participation Restrictions Driving;Yard Work;Community Activity    Stability/Clinical Decision Making Stable/Uncomplicated    Clinical Decision Making Low    Rehab Potential Good    PT Frequency 2x / week    PT Duration 4 weeks    PT Treatment/Interventions ADLs/Self Care Home Management;Therapeutic exercise;Therapeutic activities;Patient/family education;Manual techniques;Manual lymph drainage;Compression bandaging;Taping;Vasopneumatic Device;Gait training;Stair training;Passive range of motion;Scar mobilization;Joint Manipulations    PT Next Visit Plan cont ROM of bilat knees with focus on Lt  knee, balance, strength, endurance,  sit to stand working on left knee flexion and weight shift equally on bilateral LE's as well as fwd weight shift.    PT Home Exercise Plan Access Code: [email or web address]    Consulted and Agree with Plan of Care Patient             Patient will benefit from skilled therapeutic intervention in order to improve the following deficits and impairments:  Pain, Postural dysfunction, Increased fascial restricitons, Decreased strength, Decreased range of motion, Decreased scar mobility, Increased edema, Difficulty walking, Decreased skin integrity, Decreased balance  Visit Diagnosis: Decreased ROM of left knee  Muscle weakness (generalized)  Difficulty in walking, not elsewhere classified  Pain in left leg     Problem List Patient Active Problem List   Diagnosis Date Noted   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 Tymira Horkey, PT 01/19/235:02 PM   [facility] @ [place], [place], [address] Phone: [phone]   Fax:   [phone]  Name: DARRYLE DENNIE MRN: [id number] Date of Birth: 22-Aug-1935

## 2021-09-16 ENCOUNTER — Encounter: Payer: Self-pay | Admitting: Cardiology

## 2021-09-16 ENCOUNTER — Ambulatory Visit: Payer: Medicare Other | Admitting: Cardiology

## 2021-09-16 VITALS — BP 130/70 | HR 48 | Ht 72.0 in | Wt 166.0 lb

## 2021-09-16 DIAGNOSIS — R001 Bradycardia, unspecified: Secondary | ICD-10-CM | POA: Diagnosis not present

## 2021-09-16 DIAGNOSIS — I10 Essential (primary) hypertension: Secondary | ICD-10-CM

## 2021-09-16 DIAGNOSIS — I2510 Atherosclerotic heart disease of native coronary artery without angina pectoris: Secondary | ICD-10-CM

## 2021-09-16 DIAGNOSIS — Z951 Presence of aortocoronary bypass graft: Secondary | ICD-10-CM

## 2021-09-16 DIAGNOSIS — C4922 Malignant neoplasm of connective and soft tissue of left lower limb, including hip: Secondary | ICD-10-CM

## 2021-09-16 NOTE — Patient Instructions (Signed)
Medication Instructions:  The current medical regimen is effective;  continue present plan and medications.  *If you need a refill on your cardiac medications before your next appointment, please call your pharmacy*  Follow-Up: At [facility], you and your health needs are our priority.  As part of our continuing mission to provide you with exceptional heart care, we have created designated Provider Care Teams.  These Care Teams include your primary Cardiologist (physician) and Advanced Practice Providers (APPs -  Physician Assistants and Nurse Practitioners) who all work together to provide you with the care you need, when you need it.  We recommend signing up for the patient portal called "MyChart".  Sign up information is provided on this After Visit Summary.  MyChart is used to connect with patients for Virtual Visits (Telemedicine).  Patients are able to view lab/test results, encounter notes, upcoming appointments, etc.  Non-urgent messages can be sent to your provider as well.   To learn more about what you can do with MyChart, go to [email or web address]://www.mychart.com.    Your next appointment:   1 year(s)  The format for your next appointment:   In Person  Provider:   Mark Skains, MD   Thank you for choosing [facility]!!    

## 2021-09-16 NOTE — Assessment & Plan Note (Signed)
Stable, heart rate 48 bpm, asymptomatic.  On no AV nodal blocking agents.  Continue to monitor.  No indication for pacemaker.  Ambulating well.

## 2021-09-16 NOTE — Assessment & Plan Note (Signed)
Sarcoma 2022.  Resected at [facility].  Over 6 inches.  Left posterior thigh.

## 2021-09-16 NOTE — Progress Notes (Signed)
Cardiology Office Note:    Date:  09/16/2021   ID:  Joseph Werner, DOB [birth year], MRN [id number]  PCP:  Kristen Loader, FNP   [facility] Providers Cardiologist:  Candee Furbish, MD     Referring MD: Kristen Loader, FNP    History of Present Illness:    Joseph Werner is a 87 y.o. male here for the follow-up of coronary artery disease CABG 1999 asymptomatic prior.  Went to PCP and ordered a stress test at the time. In the middle of the test he stopped and Dr. Leonia Reeves performed cath at the time. Went to CABG. No subsequent caths. Had been having ETT yearly. We discussed guidelines in that a yearly stress test evaluation was not necessary unless symptoms have changed. He was concerned about this because he has been having a stress test every year, exercise treadmill test, and then he did not have any symptoms prior to his bypass surgery. I also fully explained to him that it would not be unreasonable to forego stress testing at this point. He is willing to forego stress testing at this point. LIMA graft. He did not have any vein grafts.   Has had lower extremity edema left greater than right but it is better in the mornings.  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 hAD A TERRIBLE CRAMP. 10/10. XRT Past Medical History:  Diagnosis Date   Anemia    macrocytic anemia   Arthritis    Bradycardia    chronic   CAD (coronary artery disease)    Dyslipidemia    Eczema    Hyperlipidemia    Hypothyroid    Loss of weight    Macrocytosis    with normal bone marrow biopsy in 2006 may be benign varient, evaluated in the pastby hematologistDr. Benay Spice   Osteopenia    on bone density test from orthopedist Dr. Nelva Bush, bone density test in July 2010 showed T score-1.1 in the lumbar spine and 2.1 in the femur. Bone density in August 2012 showed T score 2.3 in the femur and 2.2 in the forearm   Postsurgical aortocoronary bypass status    Prostate cancer [facility])    H/O   Prostate cancer [facility])    followed  by urology Dr. Diona Fanti    Past Surgical History:  Procedure Laterality Date   [place] Right 11/18/2012   Procedure: HERNIA REPAIR INGUINAL ADULT;  Surgeon: Merrie Roof, MD;  Location: [place];  Service: General;  Laterality: Right;   INSERTION OF MESH Right 11/18/2012   Procedure: INSERTION OF MESH;  Surgeon: Merrie Roof, MD;  Location: [place];  Service: General;  Laterality: Right;   PROSTATECTOMY      Current Medications: Current Meds  Medication Sig   Ascorbic Acid (VITAMIN C) 1000 MG tablet Take 2,000 mg by mouth daily.   aspirin EC 81 MG tablet Take 81 mg by mouth daily.   atorvastatin (LIPITOR) 40 MG tablet TAKE 1 TABLET (40 MG TOTAL) BY MOUTH DAILY AT 6 PM.   Coenzyme Q10 Liposomal 100 MG/ML LIQD Take 100 mg by mouth daily.   Flaxseed, Linseed, (FLAX SEED OIL PO) Take 1 capsule by mouth daily.   GLUCOSAMINE-CHONDROITIN PO Take 1 tablet by mouth 2 (two) times daily.    ibuprofen (ADVIL,MOTRIN) 200 MG tablet Take 400 mg by mouth every  6 (six) hours as needed for pain.   levothyroxine (SYNTHROID, LEVOTHROID) 100 MCG tablet Take 100 mcg by mouth daily.   Selenium 200 MCG CAPS Take 200 mcg by mouth daily.     Allergies:   Patient has no known allergies.   Social History   Socioeconomic History   Marital status: Married    Spouse name: Not on file   Number of children: Not on file   Years of education: Not on file   Highest education level: Not on file  Occupational History   Not on file  Tobacco Use   Smoking status: Never   Smokeless tobacco: Never  Substance and Sexual Activity   Alcohol use: Yes    Alcohol/week: 5.0 standard drinks    Types: 5 Glasses of wine per week    Comment: wine with dinner   Drug use: No   Sexual activity: Not on file  Other Topics Concern   Not on file  Social History Narrative   Not on file   Social Determinants of  Health   Financial Resource Strain: Not on file  Food Insecurity: Not on file  Transportation Needs: Not on file  Physical Activity: Not on file  Stress: Not on file  Social Connections: Not on file     Family History: The patient's family history includes Brain cancer in his brother; Breast cancer in his sister; Diabetes in his brother; Heart disease in his father; Hypertension in his father; Lung cancer in his mother; Prostate cancer in his father.  ROS:   Please see the history of present illness.    No fevers chills nausea vomiting syncope all other systems reviewed and are negative.  EKGs/Labs/Other Studies Reviewed:     EKG:  EKG is  ordered today.  The ekg ordered today demonstrates sinus bradycardia 48  Recent Labs: 09/17/2020: Hemoglobin 12.9; Platelet Count 174  Recent Lipid Panel    Component Value Date/Time   CHOL 135 01/07/2014 0735   TRIG 31.0 01/07/2014 0735   HDL 77.80 01/07/2014 0735   CHOLHDL 2 01/07/2014 0735   VLDL 6.2 01/07/2014 0735   LDLCALC 51 01/07/2014 0735     Risk Assessment/Calculations:              Physical Exam:    VS:  BP 130/70 (BP Location: Left Arm, Patient Position: Sitting, Cuff Size: Normal)    Pulse (!) 48    Ht 6' (1.829 m)    Wt 166 lb (75.3 kg)    SpO2 98%    BMI 22.51 kg/m     Wt Readings from Last 3 Encounters:  09/16/21 166 lb (75.3 kg)  01/05/21 179 lb 8 oz (81.4 kg)  06/08/20 175 lb (79.4 kg)     GEN: Well nourished, well developed in no acute distress HEENT: Normal NECK: No JVD; No carotid bruits LYMPHATICS: No lymphadenopathy CARDIAC: Bradycardic regular , no murmurs, no rubs, gallops RESPIRATORY:  Clear to auscultation without rales, wheezing or rhonchi  ABDOMEN: Soft, non-tender, non-distended MUSCULOSKELETAL:  No edema; No deformity  SKIN: Warm and dry NEUROLOGIC:  Alert and oriented x 3 PSYCHIATRIC:  Normal affect   ASSESSMENT:    1. Atherosclerosis of native coronary artery of native heart  without angina pectoris   2. Essential hypertension   3. Bradycardia   4. S/P CABG (coronary artery bypass graft)   5. Sarcoma of left thigh ([place])    PLAN:    In order of problems listed above:  Bradycardia Stable,  heart rate 48 bpm, asymptomatic.  On no AV nodal blocking agents.  Continue to monitor.  No indication for pacemaker.  Ambulating well.  Atherosclerosis of native coronary artery of native heart without angina pectoris Prior CABG 1999.  Doing well.  Had a strenuous year for his heart with his sarcoma.  Surgery on left leg.  Did well.  Continue with goal-directed medical therapy.  No beta-blocker because of bradycardia.  Aspirin.  S/P CABG (coronary artery bypass graft) As above.  Sarcoma of left thigh ([place]) Sarcoma 2022.  Resected at [facility].  Over 6 inches.  Left posterior thigh.       Medication Adjustments/Labs and Tests Ordered: Current medicines are reviewed at length with the patient today.  Concerns regarding medicines are outlined above.  Orders Placed This Encounter  Procedures   EKG 12-Lead   No orders of the defined types were placed in this encounter.   Patient Instructions  Medication Instructions:  The current medical regimen is effective;  continue present plan and medications.  *If you need a refill on your cardiac medications before your next appointment, please call your pharmacy*  Follow-Up: At [facility], you and your health needs are our priority.  As part of our continuing mission to provide you with exceptional heart care, we have created designated Provider Care Teams.  These Care Teams include your primary Cardiologist (physician) and Advanced Practice Providers (APPs -  Physician Assistants and Nurse Practitioners) who all work together to provide you with the care you need, when you need it.  We recommend signing up for the patient portal called "MyChart".  Sign up information is provided on this After Visit Summary.  MyChart is used to  connect with patients for Virtual Visits (Telemedicine).  Patients are able to view lab/test results, encounter notes, upcoming appointments, etc.  Non-urgent messages can be sent to your provider as well.   To learn more about what you can do with MyChart, go to [email or web address].    Your next appointment:   1 year(s)  The format for your next appointment:   In Person  Provider:   Candee Furbish, MD     Thank you for choosing [facility]!!      Signed, Candee Furbish, MD  09/16/2021 4:59 PM    [place]

## 2021-09-16 NOTE — Assessment & Plan Note (Signed)
Prior CABG 1999.  Doing well.  Had a strenuous year for his heart with his sarcoma.  Surgery on left leg.  Did well.  Continue with goal-directed medical therapy.  No beta-blocker because of bradycardia.  Aspirin.

## 2021-09-16 NOTE — Assessment & Plan Note (Signed)
As above.

## 2021-09-20 ENCOUNTER — Other Ambulatory Visit: Payer: Self-pay

## 2021-09-20 ENCOUNTER — Ambulatory Visit: Payer: Medicare Other

## 2021-09-20 DIAGNOSIS — M25662 Stiffness of left knee, not elsewhere classified: Secondary | ICD-10-CM | POA: Diagnosis not present

## 2021-09-20 DIAGNOSIS — M6281 Muscle weakness (generalized): Secondary | ICD-10-CM

## 2021-09-20 DIAGNOSIS — M79605 Pain in left leg: Secondary | ICD-10-CM

## 2021-09-20 DIAGNOSIS — R262 Difficulty in walking, not elsewhere classified: Secondary | ICD-10-CM

## 2021-09-20 NOTE — Therapy (Signed)
[place] @ [address] [place], [place], [address] Phone: [phone]   Fax:  [phone]  Physical Therapy Treatment  Patient Details  Name: JAUN GALLUZZO MRN: [id number] Date of Birth: 1934/10/16 Referring Provider (PT): Shona Simpson   Encounter Date: 09/20/2021   PT End of Session - 09/20/21 1152     Visit Number 29    Date for PT Re-Evaluation 11/01/21    Authorization Type begin kx after visit 55(equal to visit 24 in the new year)    Progress Note Due on Visit 43    PT Start Time 1102    PT Stop Time 1149    PT Time Calculation (min) 47 min    Activity Tolerance Patient tolerated treatment well    Behavior During Therapy [facility] for tasks assessed/performed             Past Medical History:  Diagnosis Date   Anemia    macrocytic anemia   Arthritis    Bradycardia    chronic   CAD (coronary artery disease)    Dyslipidemia    Eczema    Hyperlipidemia    Hypothyroid    Loss of weight    Macrocytosis    with normal bone marrow biopsy in 2006 may be benign varient, evaluated in the pastby hematologistDr. Benay Spice   Osteopenia    on bone density test from orthopedist Dr. Nelva Bush, bone density test in July 2010 showed T score-1.1 in the lumbar spine and 2.1 in the femur. Bone density in August 2012 showed T score 2.3 in the femur and 2.2 in the forearm   Postsurgical aortocoronary bypass status    Prostate cancer [facility])    H/O   Prostate cancer [facility])    followed by urology Dr. Diona Fanti    Past Surgical History:  Procedure Laterality Date   [place] Right 11/18/2012   Procedure: HERNIA REPAIR INGUINAL ADULT;  Surgeon: Merrie Roof, MD;  Location: [place];  Service: General;  Laterality: Right;   INSERTION OF MESH Right 11/18/2012   Procedure: INSERTION OF MESH;  Surgeon: Merrie Roof, MD;   Location: [place];  Service: General;  Laterality: Right;   PROSTATECTOMY      There were no vitals filed for this visit.   Subjective Assessment - 09/20/21 1104     Subjective I have been working on trying to stand up from a chair and I can't do it without a pillow.  I've been working on going up/down the steps with regular way    Currently in Pain? No/denies                [facility] PT Assessment - 09/20/21 0001       Assessment   Medical Diagnosis L thigh sarcoma    Referring Provider (PT) Shona Simpson    Onset Date/Surgical Date 04/11/21    Hand Dominance Right      Prior Function   Level of Independence Independent with household mobility with device      Cognition   Overall Cognitive Status Within Functional Limits for tasks assessed      PROM   PROM Assessment Site Knee    Right/Left Knee Left    Left Knee Extension -5    Left Knee Flexion 94  [place] [facility] PT Treatment/Exercise - 09/20/21 0001       Knee/Hip Exercises: Stretches   Other Knee/Hip Stretches foot on 8" step stool: static knee flexion stretch 20" hold x 8    Other Knee/Hip Stretches knee flexion with strap x 10      Knee/Hip Exercises: Aerobic   Nustep Level 6 x 44mn for ROM with arms at 11 and seat at 13      Knee/Hip Exercises: Seated   Long Arc Quad Strengthening;Left;2 sets;10 reps;Weights    Long Arc Quad Weight 5 lbs.    Long ACS[employer] CorporationLimitations focus on eccentric control    Hamstring Curl Strengthening;Left;2 sets;10 reps;Limitations    Hamstring Limitations green    Sit to Sand 2 sets;10 reps;without UE support;Other (comment)      Manual Therapy   Manual Therapy Passive ROM;Joint mobilization    Joint Mobilization patellar mobs-superior/inferior and tibial mobs anterior/posterior                          PT Long Term Goals - 09/20/21 1110       PT LONG TERM GOAL #1   Title report 50% increased ease with toiliting due  to improved functional Lt knee flexion    Baseline significant difficulty with this activity    Time 6    Period Weeks    Status New    Target Date 11/01/21      PT LONG TERM GOAL #2   Title Pt will obtain appropriate compression garments for long term management of lymphedema.    Status Achieved      PT LONG TERM GOAL #3   Title Pt will demonstrate a 3 cm decrease in circumference at 20 cm proximal to L suprapatella to decrease risk of infection.    Status Achieved      PT LONG TERM GOAL #4   Title Pt will report a 50% improvement in overall pain in Lt LE to allow improved comfort.    Baseline No Lt leg pain    Status Achieved      PT LONG TERM GOAL #5   Title Pt will demonstrate 110 degrees of bilateral knee flexion to allow pt to stand from a chair with safety    Baseline Lt P/ROM 90 today,    Time 6    Period Weeks    Status On-going    Target Date 11/01/21      PT LONG TERM GOAL #6   Title Pt will demonstrate ability to sit to stand from a chair with minimal use of UEs due to improved Lt knee flexion    Baseline still requires use of UEs but is able to bend his knee further back to assist, improved ease with this and improved controlled descent with stand to sit    Time 6    Period Weeks    Status On-going    Target Date 11/01/21      PT LONG TERM GOAL #7   Title Pt will be independent in a home exercise program for continued strengthening and stretching    Status On-going      PT LONG TERM GOAL #8   Title Pt will be able to ambulate without need for a front wheeled walker to decrease reliance on assistive device    Baseline No longer using assistive device    Status Achieved             [facility]  Goals - 09/20/21 1149       Patient will be able to verbalize understanding of pertinent lymphedema risk reduction practices relevant to her diagnosis specifically related to skin care.   Baseline --                  Plan - 09/20/21 1144      Clinical Impression Statement Patient making steady progress but needs reminders to avoid compensating/over protecting.  He avoids left knee flexion upon coming to stand and required max UE support but has more ROM available.  Pt is achieving 90 degrees of Lt knee PROM flexion consistently now.  Pt reports that he is consistently ascending steps with step-over-step and descends intermittently with step over step due to improved Lt knee flexion.  Pt with reduced eccentric control with stand to sit transition and worked on this in the clinic today with use of 1 UE support.  He measured his steps at home and states that they are 7.5 inches. He would benefit from continued skilled PT for ROM and hamstring/quad strength to improve safety with functional mobility.    PT Frequency 2x / week    PT Duration 6 weeks             Patient will benefit from skilled therapeutic intervention in order to improve the following deficits and impairments:  Pain, Postural dysfunction, Increased fascial restricitons, Decreased strength, Decreased range of motion, Decreased scar mobility, Increased edema, Difficulty walking, Decreased skin integrity, Decreased balance  Visit Diagnosis: Decreased ROM of left knee - Plan: PT plan of care cert/re-cert  Muscle weakness (generalized) - Plan: PT plan of care cert/re-cert  Difficulty in walking, not elsewhere classified - Plan: PT plan of care cert/re-cert  Pain in left leg - Plan: PT plan of care cert/re-cert     Problem List Patient Active Problem List   Diagnosis Date Noted   Sarcoma of left thigh ([place]) 01/05/2021   Need for follow-up by home health service 06/08/2020   Atherosclerosis of native coronary artery of native heart without angina pectoris 02/24/2014   S/P CABG (coronary artery bypass graft) 02/24/2014   Bradycardia 02/24/2014   Right inguinal hernia 10/21/2012   Sigurd Sos, PT 09/20/21 12:00 PM   [place] @ [address] [place], [place], [address] Phone: [phone]   Fax:  [phone]  Name: ARRICK DUTTON MRN: [id number] Date of Birth: 10-24-1934

## 2021-09-21 ENCOUNTER — Other Ambulatory Visit: Payer: Self-pay | Admitting: *Deleted

## 2021-09-21 DIAGNOSIS — D7589 Other specified diseases of blood and blood-forming organs: Secondary | ICD-10-CM

## 2021-09-21 NOTE — Progress Notes (Signed)
Lab order placed.

## 2021-09-22 ENCOUNTER — Inpatient Hospital Stay: Payer: Medicare Other | Attending: Oncology | Admitting: Oncology

## 2021-09-22 ENCOUNTER — Inpatient Hospital Stay: Payer: Medicare Other

## 2021-09-22 ENCOUNTER — Other Ambulatory Visit: Payer: Medicare Other

## 2021-09-22 ENCOUNTER — Other Ambulatory Visit: Payer: Self-pay

## 2021-09-22 VITALS — BP 137/58 | HR 100 | Temp 98.1°F | Resp 18 | Ht 72.0 in | Wt 161.2 lb

## 2021-09-22 DIAGNOSIS — Z923 Personal history of irradiation: Secondary | ICD-10-CM | POA: Diagnosis not present

## 2021-09-22 DIAGNOSIS — D7589 Other specified diseases of blood and blood-forming organs: Secondary | ICD-10-CM

## 2021-09-22 DIAGNOSIS — C499 Malignant neoplasm of connective and soft tissue, unspecified: Secondary | ICD-10-CM | POA: Insufficient documentation

## 2021-09-22 DIAGNOSIS — D649 Anemia, unspecified: Secondary | ICD-10-CM | POA: Diagnosis present

## 2021-09-22 LAB — CBC WITH DIFFERENTIAL (CANCER CENTER ONLY)
Abs Immature Granulocytes: 0.01 10*3/uL (ref 0.00–0.07)
Basophils Absolute: 0 10*3/uL (ref 0.0–0.1)
Basophils Relative: 1 %
Eosinophils Absolute: 0.2 10*3/uL (ref 0.0–0.5)
Eosinophils Relative: 4 %
HCT: 38.5 % — ABNORMAL LOW (ref 39.0–52.0)
Hemoglobin: 12.5 g/dL — ABNORMAL LOW (ref 13.0–17.0)
Immature Granulocytes: 0 %
Lymphocytes Relative: 33 %
Lymphs Abs: 1.4 10*3/uL (ref 0.7–4.0)
MCH: 34.6 pg — ABNORMAL HIGH (ref 26.0–34.0)
MCHC: 32.5 g/dL (ref 30.0–36.0)
MCV: 106.6 fL — ABNORMAL HIGH (ref 80.0–100.0)
Monocytes Absolute: 0.4 10*3/uL (ref 0.1–1.0)
Monocytes Relative: 9 %
Neutro Abs: 2.2 10*3/uL (ref 1.7–7.7)
Neutrophils Relative %: 53 %
Platelet Count: 174 10*3/uL (ref 150–400)
RBC: 3.61 MIL/uL — ABNORMAL LOW (ref 4.22–5.81)
RDW: 11.9 % (ref 11.5–15.5)
WBC Count: 4.2 10*3/uL (ref 4.0–10.5)
nRBC: 0 % (ref 0.0–0.2)

## 2021-09-22 NOTE — Progress Notes (Signed)
[facility] OFFICE PROGRESS NOTE   Diagnosis: Red cell macrocytosis  INTERVAL HISTORY:   [name] returns for a scheduled visit.  He was last seen in the hematology clinic 2 years ago.  The CBC was stable last year.  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  He underwent radiation 01/17/2021 - 02/21/2021, 50 [place] in 25 fractions.  This was followed by radical resection 04/11/2021.  He had physical therapy following surgery and continues to have limited range of motion at the left knee.  He is followed at [facility] for surveillance .  A CT of the chest 08/23/2021 revealed no evidence of metastatic disease.  An MRI of the left femur 08/23/2021 revealed a rim-enhancing collection at the surgical site felt to represent a postsurgical hematoma.  Objective:  Vital signs in last 24 hours:  Blood pressure (!) 137/58, pulse 100, temperature 98.1 F (36.7 C), temperature source Oral, resp. rate 18, height 6' (1.829 m), weight 161 lb 3.2 oz (73.1 kg), SpO2 95 %.    Lymphatics: No cervical, supraclavicular, axillary, or inguinal nodes Resp: Lungs clear bilaterally Cardio: Regular rate and rhythm GI: No hepatosplenomegaly Vascular: Trace edema of the left lower leg with a support stocking in place  Skin: Healed left thigh incision.  No evidence of recurrent tumor Musculoskeletal: No left thigh mass   Lab Results:  Lab Results  Component Value Date   WBC 4.2 09/22/2021   HGB 12.5 (L) 09/22/2021   HCT 38.5 (L) 09/22/2021   MCV 106.6 (H) 09/22/2021   PLT 174 09/22/2021   NEUTROABS 2.2 09/22/2021    CMP  Lab Results  Component Value Date   NA 143 11/11/2012   K 4.4 11/11/2012   CL 108 11/11/2012   CO2 29 11/11/2012   GLUCOSE 95 11/11/2012   BUN 25 (H) 11/11/2012   CREATININE 1.11 11/11/2012   CALCIUM 9.6 11/11/2012   PROT 6.4 01/07/2014   ALBUMIN 3.9 01/07/2014   AST  29 01/07/2014   ALT 22 01/07/2014   ALKPHOS 26 (L) 01/07/2014   BILITOT 1.6 (H) 01/07/2014   GFRNONAA 63 (L) 11/11/2012   GFRAA 73 (L) 11/11/2012     Medications: I have reviewed the patient's current medications.   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 A bone marrow biopsy in 2006 was nondiagnostic. 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 Chronic bradycardia. History of a Borderline low white count Right inguinal hernia repair 11/18/2012 Left thigh pleomorphic liposarcoma, grade 3 with necrosis, negative margins, status post neoadjuvant radiation 01/17/2021 - 02/21/2021 (50 [place]), followed by radical resection 04/11/2021, pT4     Disposition: Joseph Werner is stable from a hematologic standpoint.  He would like to continue follow-up in the hematology clinic.  He has chronic Red cell macrocytosis and mild anemia.  He will return for an office visit in 1 year. He will continue follow-up at [facility] for surveillance after resection of the left thigh liposarcoma.  I am available to see him in the interim as needed.  Betsy Coder, MD  09/22/2021  12:11 PM

## 2021-09-27 ENCOUNTER — Other Ambulatory Visit: Payer: Self-pay

## 2021-09-27 ENCOUNTER — Ambulatory Visit: Payer: Medicare Other

## 2021-09-27 DIAGNOSIS — R262 Difficulty in walking, not elsewhere classified: Secondary | ICD-10-CM

## 2021-09-27 DIAGNOSIS — M25662 Stiffness of left knee, not elsewhere classified: Secondary | ICD-10-CM | POA: Diagnosis not present

## 2021-09-27 DIAGNOSIS — M25661 Stiffness of right knee, not elsewhere classified: Secondary | ICD-10-CM

## 2021-09-27 DIAGNOSIS — M79605 Pain in left leg: Secondary | ICD-10-CM

## 2021-09-27 DIAGNOSIS — M6281 Muscle weakness (generalized): Secondary | ICD-10-CM

## 2021-09-27 NOTE — Therapy (Signed)
[place] @ [place], [place], [address] Phone: [phone]   Fax:  [phone]  Physical Therapy Treatment  Patient Details  Name: Joseph Werner MRN: [id number] Date of Birth: 1935-03-08 Referring Provider (PT): Shona Simpson   Encounter Date: 09/27/2021   PT End of Session - 09/27/21 1312     Visit Number 36    Date for PT Re-Evaluation 11/01/21    Authorization Type begin kx after visit 55(equal to visit 68 in the new year)    Progress Note Due on Visit 56    PT Start Time 1228    PT Stop Time 1307    PT Time Calculation (min) 39 min    Activity Tolerance Patient tolerated treatment well    Behavior During Therapy [facility] for tasks assessed/performed             Past Medical History:  Diagnosis Date   Anemia    macrocytic anemia   Arthritis    Bradycardia    chronic   CAD (coronary artery disease)    Dyslipidemia    Eczema    Hyperlipidemia    Hypothyroid    Loss of weight    Macrocytosis    with normal bone marrow biopsy in 2006 may be benign varient, evaluated in the pastby hematologistDr. Benay Spice   Osteopenia    on bone density test from orthopedist Dr. Nelva Bush, bone density test in July 2010 showed T score-1.1 in the lumbar spine and 2.1 in the femur. Bone density in August 2012 showed T score 2.3 in the femur and 2.2 in the forearm   Postsurgical aortocoronary bypass status    Prostate cancer [facility])    H/O   Prostate cancer [facility])    followed by urology Dr. Diona Fanti    Past Surgical History:  Procedure Laterality Date   [place] Right 11/18/2012   Procedure: HERNIA REPAIR INGUINAL ADULT;  Surgeon: Merrie Roof, MD;  Location: [place];  Service: General;  Laterality: Right;   INSERTION OF MESH Right 11/18/2012   Procedure: INSERTION OF MESH;  Surgeon: Merrie Roof, MD;   Location: [place];  Service: General;  Laterality: Right;   PROSTATECTOMY      There were no vitals filed for this visit.   Subjective Assessment - 09/27/21 1227     Subjective I'm doing ok getting in/out of the car and i'm driving now.    Pertinent History L thigh sarcoma, currently undergoing radiation, underwent sarcoma removal surgery on 04/11/21, heart bypass surgery, gallbladder removed, prostate cancer    Patient Stated Goals to wake up one morning without any pain and be able to walk across the room like nothing happened    Currently in Pain? No/denies                               [facility] Adult PT Treatment/Exercise - 09/27/21 0001       Knee/Hip Exercises: Stretches   Other Knee/Hip Stretches knee flexion with strap x 10      Knee/Hip Exercises: Aerobic   Nustep Level 6 x 8 min for ROM with arms at 11 and seat at 13      Knee/Hip Exercises: Standing   Forward Step Up Left;2 sets;10  reps;Step Height: 8";Hand Hold: 1    Step Down Left;2 sets;10 reps;Hand Hold: 1;Step Height: 8"      Knee/Hip Exercises: Seated   Long Arc Quad Strengthening;Left;2 sets;10 reps;Weights    Long Arc Quad Weight 5 lbs.    Long [employer] Limitations focus on eccentric control    Hamstring Curl Strengthening;Left;2 sets;10 reps;Limitations    Hamstring Limitations green    Sit to Sand 2 sets;10 reps;without UE support;Other (comment)   used hi-lo table to improve knee flexion with this-22 inches     Manual Therapy   Manual Therapy Passive ROM;Joint mobilization    Manual therapy comments mulligan strap used for steated Lt knee mobs    Joint Mobilization patellar mobs-superior/inferior and tibial mobs anterior/posterior                          PT Long Term Goals - 09/27/21 1235       PT LONG TERM GOAL #1   Title report 50% increased ease with toiliting due to improved functional Lt knee flexion    Status On-going      PT LONG TERM GOAL #2   Title Pt  will obtain appropriate compression garments for long term management of lymphedema.    Status Achieved      PT LONG TERM GOAL #5   Title Pt will demonstrate 110 degrees of bilateral knee flexion to allow pt to stand from a chair with safety    Baseline Lt P/ROM 90 last week    Status On-going                   Plan - 09/27/21 1233     Clinical Impression Statement Pt continues to work on his knee flexion at home.    Pt is achieving 90 degrees of Lt knee PROM flexion consistently now although requires verbal cues for increased knee flexion with sit to stand.   Pt reports that he is consistently ascending steps with step-over-step and descends intermittently with step over step due to improved Lt knee flexion.  Pt with reduced eccentric control with stand to sit transition. Pt tolerates knee flexion P/ROM and mobs to help improve his mobility. He would benefit from continued skilled PT for ROM and hamstring/quad strength to improve safety with functional mobility.    PT Frequency 2x / week    PT Duration 6 weeks    PT Treatment/Interventions ADLs/Self Care Home Management;Therapeutic exercise;Therapeutic activities;Patient/family education;Manual techniques;Manual lymph drainage;Compression bandaging;Taping;Vasopneumatic Device;Gait training;Stair training;Passive range of motion;Scar mobilization;Joint Manipulations    PT Next Visit Plan cont ROM of bilat knees with focus on Lt  knee, balance, strength, endurance,  sit to stand working on left knee flexion and weight shift equally on bilateral LE's as well as fwd weight shift.    PT Home Exercise Plan Access Code: [email or web address]    Family Member Consulted wife             Patient will benefit from skilled therapeutic intervention in order to improve the following deficits and impairments:  Pain, Postural dysfunction, Increased fascial restricitons, Decreased strength, Decreased range of motion, Decreased scar mobility, Increased edema,  Difficulty walking, Decreased skin integrity, Decreased balance  Visit Diagnosis: Decreased ROM of left knee  Muscle weakness (generalized)  Difficulty in walking, not elsewhere classified  Pain in left leg  Decreased ROM of right knee     Problem List Patient Active Problem List   Diagnosis Date Noted  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

## 2021-09-29 ENCOUNTER — Ambulatory Visit: Payer: Medicare Other | Attending: Orthopedic Surgery

## 2021-09-29 ENCOUNTER — Other Ambulatory Visit: Payer: Self-pay

## 2021-09-29 DIAGNOSIS — I890 Lymphedema, not elsewhere classified: Secondary | ICD-10-CM | POA: Insufficient documentation

## 2021-09-29 DIAGNOSIS — M25662 Stiffness of left knee, not elsewhere classified: Secondary | ICD-10-CM

## 2021-09-29 DIAGNOSIS — R262 Difficulty in walking, not elsewhere classified: Secondary | ICD-10-CM

## 2021-09-29 DIAGNOSIS — M25661 Stiffness of right knee, not elsewhere classified: Secondary | ICD-10-CM | POA: Insufficient documentation

## 2021-09-29 DIAGNOSIS — M79605 Pain in left leg: Secondary | ICD-10-CM | POA: Insufficient documentation

## 2021-09-29 DIAGNOSIS — M6281 Muscle weakness (generalized): Secondary | ICD-10-CM | POA: Diagnosis present

## 2021-09-29 NOTE — Therapy (Signed)
[place] @ [place], [place], [address] Phone: [phone]   Fax:  [phone]  Physical Therapy Treatment  Patient Details  Name: Joseph Werner MRN: [id number] Date of Birth: 10-22-1934 Referring Provider (PT): Shona Simpson   Encounter Date: 09/29/2021   PT End of Session - 09/29/21 1146     Visit Number 32    Date for PT Re-Evaluation 11/01/21    Authorization Type begin kx after visit 55(equal to visit 6 in the new year)    Progress Note Due on Visit 11    PT Start Time 1101    PT Stop Time 1143    PT Time Calculation (min) 42 min    Activity Tolerance Patient tolerated treatment well    Behavior During Therapy [facility] for tasks assessed/performed             Past Medical History:  Diagnosis Date   Anemia    macrocytic anemia   Arthritis    Bradycardia    chronic   CAD (coronary artery disease)    Dyslipidemia    Eczema    Hyperlipidemia    Hypothyroid    Loss of weight    Macrocytosis    with normal bone marrow biopsy in 2006 may be benign varient, evaluated in the pastby hematologistDr. Benay Spice   Osteopenia    on bone density test from orthopedist Dr. Nelva Bush, bone density test in July 2010 showed T score-1.1 in the lumbar spine and 2.1 in the femur. Bone density in August 2012 showed T score 2.3 in the femur and 2.2 in the forearm   Postsurgical aortocoronary bypass status    Prostate cancer [facility])    H/O   Prostate cancer [facility])    followed by urology Dr. Diona Fanti    Past Surgical History:  Procedure Laterality Date   [place] Right 11/18/2012   Procedure: HERNIA REPAIR INGUINAL ADULT;  Surgeon: Merrie Roof, MD;  Location: [place];  Service: General;  Laterality: Right;   INSERTION OF MESH Right 11/18/2012   Procedure: INSERTION OF MESH;  Surgeon: Merrie Roof, MD;   Location: [place];  Service: General;  Laterality: Right;   PROSTATECTOMY      There were no vitals filed for this visit.   Subjective Assessment - 09/29/21 1109     Subjective I didn't have any pain after last session.    Currently in Pain? No/denies                [facility] PT Assessment - 09/29/21 0001       PROM   Right/Left Knee Left    Left Knee Flexion 95                           [facility] Adult PT Treatment/Exercise - 09/29/21 0001       Knee/Hip Exercises: Stretches   Other Knee/Hip Stretches knee flexion with strap x 10      Knee/Hip Exercises: Aerobic   Nustep Level 6 x 8 min for ROM with arms at 11 and seat at 13      Knee/Hip Exercises: Standing   Other Standing Knee Exercises step over 6" hurdles to encourage Lt knee flexion forward and sidestepping x4 laps in // bars  Knee/Hip Exercises: Seated   Long Arc Quad Strengthening;Left;2 sets;10 reps;Weights    Long Arc Quad Weight 5 lbs.    Long [employer] Limitations focus on eccentric control    Hamstring Curl Strengthening;Left;2 sets;10 reps;Limitations    Hamstring Limitations green    Sit to Sand 2 sets;10 reps;without UE support;Other (comment)   used hi-lo table to improve knee flexion with this-22 inches     Manual Therapy   Manual Therapy Passive ROM;Joint mobilization    Manual therapy comments mulligan strap used for steated Lt knee mobs    Joint Mobilization patellar mobs-superior/inferior and tibial mobs anterior/posterior                          PT Long Term Goals - 09/27/21 1235       PT LONG TERM GOAL #1   Title report 50% increased ease with toiliting due to improved functional Lt knee flexion    Status On-going      PT LONG TERM GOAL #2   Title Pt will obtain appropriate compression garments for long term management of lymphedema.    Status Achieved      PT LONG TERM GOAL #5   Title Pt will demonstrate 110 degrees of bilateral knee flexion to allow  pt to stand from a chair with safety    Baseline Lt P/ROM 90 last week    Status On-going                   Plan - 09/29/21 1144     Clinical Impression Statement Pt continues to work on his knee flexion at home. Pt is achieving 90 degrees of Lt knee PROM flexion consistently now although requires verbal cues for increased knee flexion with sit to stand.  P/ROM Lt knee flexion is 95 today.   Pt does well with sit to stand from 22 mat table with min to moderate UE support and improved eccentric control with stand to sit.  Pt tolerate knee flexion P/ROM and mobs last session without resultant soreness. He would benefit from continued skilled PT for ROM and hamstring/quad strength to improve safety with functional mobility.    PT Treatment/Interventions ADLs/Self Care Home Management;Therapeutic exercise;Therapeutic activities;Patient/family education;Manual techniques;Manual lymph drainage;Compression bandaging;Taping;Vasopneumatic Device;Gait training;Stair training;Passive range of motion;Scar mobilization;Joint Manipulations    PT Next Visit Plan cont ROM of bilat knees with focus on Lt  knee, balance, strength, endurance,  sit to stand working on left knee flexion and weight shift equally on bilateral LE's as well as fwd weight shift.    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

## 2021-10-04 ENCOUNTER — Other Ambulatory Visit: Payer: Self-pay

## 2021-10-04 ENCOUNTER — Ambulatory Visit: Payer: Medicare Other

## 2021-10-04 DIAGNOSIS — M25662 Stiffness of left knee, not elsewhere classified: Secondary | ICD-10-CM

## 2021-10-04 DIAGNOSIS — M6281 Muscle weakness (generalized): Secondary | ICD-10-CM

## 2021-10-04 DIAGNOSIS — R262 Difficulty in walking, not elsewhere classified: Secondary | ICD-10-CM

## 2021-10-04 NOTE — Therapy (Signed)
OUTPATIENT PHYSICAL THERAPY TREATMENT NOTE   Patient Name: Joseph Werner MRN: [id number] DOB:September 07, 1934, 87 y.o., male Today's Date: 10/04/2021  PCP: Joseph Loader, FNP REFERRING PROVIDER: Shona Werner, [facility]   PT End of Session - 10/04/21 1142     Visit Number 28    Date for PT Re-Evaluation 11/01/21    Authorization Type begin kx after visit 55(equal to visit 73 in the new year)    Progress Note Due on Visit 9    PT Start Time 1102    PT Stop Time 1142    PT Time Calculation (min) 40 min    Activity Tolerance Patient tolerated treatment well    Behavior During Therapy Joseph Werner for tasks assessed/performed             Past Medical History:  Diagnosis Date   Anemia    macrocytic anemia   Arthritis    Bradycardia    chronic   CAD (coronary artery disease)    Dyslipidemia    Eczema    Hyperlipidemia    Hypothyroid    Loss of weight    Macrocytosis    with normal bone marrow biopsy in 2006 may be benign varient, evaluated in the pastby hematologistDr. Benay Werner   Osteopenia    on bone density test from orthopedist Joseph Werner, bone density test in July 2010 showed T score-1.1 in the lumbar spine and 2.1 in the femur. Bone density in August 2012 showed T score 2.3 in the femur and 2.2 in the forearm   Postsurgical aortocoronary bypass status    Prostate cancer [facility])    H/O   Prostate cancer [facility])    followed by urology Joseph Werner   Past Surgical History:  Procedure Laterality Date   [place] Right 11/18/2012   Procedure: HERNIA REPAIR INGUINAL ADULT;  Surgeon: Joseph Roof, MD;  Location: [place];  Service: General;  Laterality: Right;   INSERTION OF MESH Right 11/18/2012   Procedure: INSERTION OF MESH;  Surgeon: Joseph Roof, MD;  Location: [place];  Service: General;  Laterality: Right;   PROSTATECTOMY     Patient Active Problem List   Diagnosis  Date Noted   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   Need for follow-up by home health service 06/08/2020   Atherosclerosis of native coronary artery of native heart without angina pectoris 02/24/2014   S/P CABG (coronary artery bypass graft) 02/24/2014   Bradycardia 02/24/2014   Right inguinal hernia 10/21/2012    REFERRING DIAG: Lt thigh sarcoma  THERAPY DIAG:  Decreased ROM of left knee  Muscle weakness (generalized)  Difficulty in walking, not elsewhere classified  PERTINENT HISTORY: L thigh sarcoma, currently undergoing radiation, underwent sarcoma removal surgery on 04/11/21, heart bypass surgery, gallbladder removed, prostate cancer   PRECAUTIONS: Falls risk, active cancer   SUBJECTIVE: I wasn't sore after last session.    PAIN:  Are you having pain? No     10/04/21 TODAY'S TREATMENT:  10/04/21:  Exercise:  NuStep: Level 5x 8 minutes- PT present to discuss progress.  Long arc quads: 4# 2x10 Seated knee flexion- green band 2x10 Sit to stand: 22" mat table 2x10- emphasis on keeping knee in flexion and even with Rt LE 8" step-ups 2x10- parallel bars elevated to 42 for this Stretch into flexion on 8" step x 10 Step  over hurdles in parallel bars- forward and sidestepping x 4 laps in parallel bars each Manual:  Mulligan strap used for seated Lt knee mobs, patellar mobs, superior/inferior and tibial mobs anterior/posterior     HOME EXERCISE PROGRAM: NA  GOALS: Goals reviewed with patient? Yes  LONG TERM GOALS:   LTG Name Target Date Goal status  1 report 50% increased ease with toiliting due to improved functional Lt knee flexion  Baseline: 11/01/21 IN PROGRESS  2 Pt will demonstrate 110 degrees of bilateral knee flexion to allow pt to stand from a chair with safety  Baseline: 95 degrees  11/01/21 IN PROGRESS  3 Pt will demonstrate ability to sit to stand from a chair with minimal use of UEs due to improved Lt knee flexion  Baseline: 11/01/21 IN PROGRESS  4 Pt will  be independent in a home exercise program for continued strengthening and stretching  Baseline: 11/01/21 IN PROGRESS                  PLAN: Pt continues to work on his knee flexion at home. Manual into flexion comes with increased ease into flexion.  Pt does well with sit to stand from 22 mat table with min to moderate UE support and improved eccentric control with stand to sit.  Pt tolerate knee flexion P/ROM and mobs last session without resultant soreness.  Pt is achieving improved Lt knee flexion to improve stepping over obstacles and onto 8" step without compensation. He would benefit from continued skilled PT for ROM and hamstring/quad strength to improve safety with functional mobility.  PT FREQUENCY: 2x/week  PT DURATION: 8 weeks  PLANNED INTERVENTIONS: Therapeutic exercises, Therapeutic activity, Neuro Muscular re-education, Balance training, Gait training, Patient/Family education, Joint mobilization, Stair training, Taping, and Manual therapy  PLAN FOR NEXT SESSION: Work on Lt knee flexion, strength, sit to stand and stability.  Lower table height for sit to stand.   Joseph Werner, PT 10/04/21 11:44 AM           Joseph Werner, PT 10/04/2021, 11:44 AM

## 2021-10-06 ENCOUNTER — Ambulatory Visit: Payer: Medicare Other

## 2021-10-06 ENCOUNTER — Other Ambulatory Visit: Payer: Self-pay

## 2021-10-06 DIAGNOSIS — M6281 Muscle weakness (generalized): Secondary | ICD-10-CM

## 2021-10-06 DIAGNOSIS — M25662 Stiffness of left knee, not elsewhere classified: Secondary | ICD-10-CM | POA: Diagnosis not present

## 2021-10-06 DIAGNOSIS — R262 Difficulty in walking, not elsewhere classified: Secondary | ICD-10-CM

## 2021-10-06 NOTE — Therapy (Signed)
OUTPATIENT PHYSICAL THERAPY TREATMENT NOTE   Patient Name: Joseph Werner MRN: [id number] DOB:1934-11-20, 87 y.o., male Today's Date: 10/06/2021  PCP: Kristen Loader, FNP REFERRING PROVIDER: Shona Simpson, [facility]   PT End of Session - 10/06/21 1155     Visit Number 62    Date for PT Re-Evaluation 11/01/21    Authorization Type begin kx after visit 55(equal to visit 23 in the new year)    Progress Note Due on Visit 29    PT Start Time 1102    PT Stop Time 1144    PT Time Calculation (min) 42 min    Activity Tolerance Patient tolerated treatment well    Behavior During Therapy [facility] for tasks assessed/performed              Past Medical History:  Diagnosis Date   Anemia    macrocytic anemia   Arthritis    Bradycardia    chronic   CAD (coronary artery disease)    Dyslipidemia    Eczema    Hyperlipidemia    Hypothyroid    Loss of weight    Macrocytosis    with normal bone marrow biopsy in 2006 may be benign varient, evaluated in the pastby hematologistDr. Benay Spice   Osteopenia    on bone density test from orthopedist Dr. Nelva Bush, bone density test in July 2010 showed T score-1.1 in the lumbar spine and 2.1 in the femur. Bone density in August 2012 showed T score 2.3 in the femur and 2.2 in the forearm   Postsurgical aortocoronary bypass status    Prostate cancer [facility])    H/O   Prostate cancer [facility])    followed by urology Dr. 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   Need for follow-up by home health service 06/08/2020   Atherosclerosis of native coronary artery of native heart without angina pectoris 02/24/2014   S/P CABG (coronary artery bypass graft) 02/24/2014   Bradycardia 02/24/2014   Right inguinal hernia 10/21/2012    REFERRING DIAG: Lt thigh sarcoma  THERAPY DIAG:  Decreased ROM of left knee  Muscle weakness (generalized)  Difficulty in walking, not elsewhere classified  PERTINENT HISTORY: L thigh sarcoma, currently undergoing radiation, underwent sarcoma removal surgery on 04/11/21, heart bypass surgery, gallbladder removed, prostate cancer   PRECAUTIONS: Falls risk, active cancer   SUBJECTIVE: I'm still working to bend my knee.  I don't get sore after treatment.     PAIN:  Are you having pain? No  Objective measures: Lt knee P/ROM: extension: 0 degrees, flexion 100 degrees 10/06/21 TODAY'S TREATMENT: Exercise:  NuStep: Level 5x 8 minutes- PT present to discuss progress.  Long arc quads: 4# 2x10 Seated knee flexion- green band 2x10 Sit to stand: 23" (2 pads in chair)  2x10- emphasis on keeping knee in flexion and even with Rt LE 8"  step-ups 2x10- parallel bars elevated to 42 for this Stretch into flexion on 8" step x 10 Step over hurdles in parallel bars- forward and sidestepping x 4 laps in parallel bars each. Able to do without UE support today. Supine heel slide stretch with green strap: 10" x10 Manual:  P/ROM into knee extension: 10' hold x10- pt achieved 0 degrees of knee flexion   10/04/21 TODAY'S TREATMENT:  Exercise:  NuStep: Level 5x 8 minutes- PT present to discuss progress.  Long arc quads: 4# 2x10 Seated knee flexion- green band 2x10 Sit to stand: 22" mat table 2x10- emphasis on keeping knee in flexion and even with Rt LE  8" step-ups 2x10- parallel bars elevated to 42 for this- emphasis on quad activation and reducing momentum and UE use. Stretch into flexion  on 8" step x 10 Step over hurdles in parallel bars- forward and sidestepping x 4 laps in parallel bars each.    Manual:  Mulligan strap used for seated Lt knee mobs, patellar mobs, superior/inferior and tibial mobs anterior/posterior   HOME EXERCISE PROGRAM: NA  GOALS: Goals reviewed with patient? Yes  LONG TERM GOALS:   LTG Name Target Date Goal status  1 report 50% increased ease with toiliting due to improved functional Lt knee flexion  Baseline: 11/01/21 IN PROGRESS  2 Pt will demonstrate 110 degrees of bilateral knee flexion to allow pt to stand from a chair with safety  Baseline: 100  11/01/21 IN PROGRESS  3 Pt will demonstrate ability to sit to stand from a chair with minimal use of UEs due to improved Lt knee flexion  Baseline: continues to have reduced Lt knee flexion and required moderate UE support (10/06/21) 11/01/21 IN PROGRESS  4 Pt will be independent in a home exercise program for continued strengthening and stretching  Baseline: 11/01/21 IN PROGRESS                  PLAN: Pt continues to work on his knee flexion at home.   Pt continues to be challenged with sit to stand from standard height seat.  Improved technique with increased chair height although pt was challenged today and not able to perform without momentum.  Pt tolerate knee flexion P/ROM and mobs last session without resultant soreness.  Pt is able to perform 8" step-up without compensatory motion and is limited with step-down due to reduced Lt knee flexion available. P/ROM flexion today to 100 degrees, extension to 0 degrees.  He would benefit from continued skilled PT for ROM and hamstring/quad strength to improve safety with functional mobility.  PT FREQUENCY: 2x/week  PT DURATION: 8 weeks  PLANNED INTERVENTIONS: Therapeutic exercises, Therapeutic activity, Neuro Muscular re-education, Balance training, Gait training, Patient/Family education, Joint mobilization, Stair training, Taping, and Manual therapy  PLAN  FOR NEXT SESSION: Work on Lt knee flexion, strength, sit to stand and stability.  Lower table height for sit to stand.   Sigurd Sos, PT 10/06/21 11:57 AM

## 2021-10-11 ENCOUNTER — Other Ambulatory Visit: Payer: Self-pay

## 2021-10-11 ENCOUNTER — Ambulatory Visit: Payer: Medicare Other

## 2021-10-11 DIAGNOSIS — M6281 Muscle weakness (generalized): Secondary | ICD-10-CM

## 2021-10-11 DIAGNOSIS — M25661 Stiffness of right knee, not elsewhere classified: Secondary | ICD-10-CM

## 2021-10-11 DIAGNOSIS — R262 Difficulty in walking, not elsewhere classified: Secondary | ICD-10-CM

## 2021-10-11 DIAGNOSIS — M25662 Stiffness of left knee, not elsewhere classified: Secondary | ICD-10-CM | POA: Diagnosis not present

## 2021-10-11 DIAGNOSIS — M79605 Pain in left leg: Secondary | ICD-10-CM

## 2021-10-11 NOTE — Therapy (Signed)
OUTPATIENT PHYSICAL THERAPY TREATMENT NOTE   Patient Name: Joseph Werner MRN: [id number] DOB:07-Dec-1934, 87 y.o., male Today's Date: 10/11/2021  PCP: Kristen Loader, FNP REFERRING PROVIDER: Shona Simpson, [facility]   PT End of Session - 10/11/21 1104     Visit Number 57    Date for PT Re-Evaluation 11/01/21    Authorization Type begin kx after visit 55(equal to visit 38 in the new year)    Progress Note Due on Visit 12    PT Start Time 1100    PT Stop Time 1145    PT Time Calculation (min) 45 min    Activity Tolerance Patient tolerated treatment well    Behavior During Therapy [facility] for tasks assessed/performed              Past Medical History:  Diagnosis Date   Anemia    macrocytic anemia   Arthritis    Bradycardia    chronic   CAD (coronary artery disease)    Dyslipidemia    Eczema    Hyperlipidemia    Hypothyroid    Loss of weight    Macrocytosis    with normal bone marrow biopsy in 2006 may be benign varient, evaluated in the pastby hematologistDr. Benay Spice   Osteopenia    on bone density test from orthopedist Dr. Nelva Bush, bone density test in July 2010 showed T score-1.1 in the lumbar spine and 2.1 in the femur. Bone density in August 2012 showed T score 2.3 in the femur and 2.2 in the forearm   Postsurgical aortocoronary bypass status    Prostate cancer [facility])    H/O   Prostate cancer [facility])    followed by urology Dr. 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   Need for follow-up by home health service 06/08/2020   Atherosclerosis of native coronary artery of native heart without angina pectoris 02/24/2014   S/P CABG (coronary artery bypass graft) 02/24/2014   Bradycardia 02/24/2014   Right inguinal hernia 10/21/2012    REFERRING DIAG: Lt thigh sarcoma  THERAPY DIAG:  Decreased ROM of left knee  Muscle weakness (generalized)  Difficulty in walking, not elsewhere classified  Pain in left leg  Decreased ROM of right knee  PERTINENT HISTORY: L thigh sarcoma, currently undergoing radiation, underwent sarcoma removal surgery on 04/11/21, heart bypass surgery, gallbladder removed, prostate cancer   PRECAUTIONS: Falls risk, active cancer   SUBJECTIVE: I still struggle coming to stand from a chair without sticking my leg out straight.  I would like to be able to reach my left foot to tie my shoe and to be able to stand up and sit down without several attempts and sticking my leg way out.    PAIN:  Are you having pain? No   Objective measures: Lt knee P/ROM: extension: 0 degrees, flexion 102 degrees  10/11/21  TODAY'S TREATMENT: Exercise:  NuStep: Level 5x 8 minutes- PT present to discuss progress.  PROM seated with balance pad in chair x 5 holding 10 seconds into knee flexion on left Instructed/reviewed transfer in/out car on drivers side x 2 Instructed in propping left foot on stepstool to tie left shoe; patient unable to reach shoe, therefore we worked on segmental stretching to achieve reaching his shoe( foot on stool grabbing knee and pulliing fwd,  strap on foot and stretching into piriformis position) Long arc quads: 4# 2x10 Seated knee flexion- green band 2x10 Sit to stand: 23" (2 pads in chair)  2x10- emphasis on keeping knee in flexion and even with Rt LE  Manual:  P/ROM into knee extension: 10' hold x10- pt achieved 0 degrees of knee flexion  10/06/21 TODAY'S  TREATMENT: Exercise:  NuStep: Level 5x 8 minutes- PT present to discuss progress.  Long arc quads: 4# 2x10 Seated knee flexion- green band 2x10 Sit to stand: 23" (2 pads in chair)  2x10- emphasis on keeping knee in flexion and even with Rt LE 8" step-ups 2x10- parallel bars elevated to 42 for this Stretch into flexion on 8" step x 10 Step over hurdles in parallel bars- forward and sidestepping x 4 laps in parallel bars each. Able to do without UE support today. Supine heel slide stretch with green strap: 10" x10 Manual:  P/ROM into knee extension: 10' hold x10- pt achieved 0 degrees of knee flexion   10/04/21 TODAY'S TREATMENT:  Exercise:  NuStep: Level 5x 8 minutes- PT present to discuss progress.  Long arc quads: 4# 2x10 Seated knee flexion- green band 2x10 Sit to stand: 22" mat table 2x10- emphasis on keeping knee in flexion and even with Rt LE  8" step-ups 2x10- parallel bars elevated to 42 for this- emphasis on quad activation and reducing momentum and UE use. Stretch into flexion on 8" step x 10 Step over hurdles in parallel bars- forward and sidestepping x 4 laps in parallel bars each.    Manual:  Mulligan strap used for seated Lt knee mobs, patellar mobs, superior/inferior and tibial mobs anterior/posterior   HOME EXERCISE PROGRAM: NA  GOALS: Goals reviewed with patient? Yes  LONG TERM GOALS:   LTG Name Target Date Goal status  1 report 50% increased ease with toiliting due to improved functional Lt knee flexion  Baseline: 11/01/21 IN PROGRESS  2 Pt will demonstrate 110 degrees of bilateral knee flexion to allow pt to stand from a chair with safety  Baseline: 100  11/01/21 IN PROGRESS  3 Pt will demonstrate ability to sit to stand from a chair with minimal use of UEs due to improved Lt knee flexion  Baseline: continues to have reduced Lt knee flexion and required moderate UE support (10/06/21) 11/01/21 IN PROGRESS  4 Pt will be independent in a home exercise program for  continued strengthening and stretching  Baseline: 11/01/21 IN PROGRESS                  PLAN: Patient continues to have difficulty coming to stand.  But wife admits, also, that family tends to jump up to help him every time and he doesn't hesitate to accept the help.  His gait is normalizing and he is able to come to stand using his hands from most levels.  He transfers in/out of car with ease on both driver and passenger side.  He would benefit from continuing skilled PT to focus on function left LE ROM.  PT FREQUENCY: 2x/week  PT DURATION: 8 weeks  PLANNED INTERVENTIONS: Therapeutic exercises, Therapeutic activity, Neuro Muscular re-education, Balance training, Gait training, Patient/Family education, Joint mobilization, Stair training, Taping, and Manual therapy  PLAN FOR NEXT SESSION: Work on Lt knee flexion, strength, sit to stand and stability.  Lower table height for sit to stand.   Anderson Malta B. Jancy Sprankle, PT 10/11/2310:08 PM

## 2021-10-13 ENCOUNTER — Ambulatory Visit: Payer: Medicare Other

## 2021-10-13 ENCOUNTER — Other Ambulatory Visit: Payer: Self-pay

## 2021-10-13 DIAGNOSIS — M25661 Stiffness of right knee, not elsewhere classified: Secondary | ICD-10-CM

## 2021-10-13 DIAGNOSIS — M79605 Pain in left leg: Secondary | ICD-10-CM

## 2021-10-13 DIAGNOSIS — M25662 Stiffness of left knee, not elsewhere classified: Secondary | ICD-10-CM | POA: Diagnosis not present

## 2021-10-13 DIAGNOSIS — R262 Difficulty in walking, not elsewhere classified: Secondary | ICD-10-CM

## 2021-10-13 DIAGNOSIS — I890 Lymphedema, not elsewhere classified: Secondary | ICD-10-CM

## 2021-10-13 DIAGNOSIS — M6281 Muscle weakness (generalized): Secondary | ICD-10-CM

## 2021-10-13 NOTE — Therapy (Signed)
OUTPATIENT PHYSICAL THERAPY RE-ASSESSMENT NOTE   Patient Name: Joseph Werner MRN: [id number] DOB:08-22-1935, 87 y.o., male Today's Date: 10/18/2021  PCP: Kristen Loader, FNP REFERRING PROVIDER: Shona Simpson, [facility]      Past Medical History:  Diagnosis Date   Anemia    macrocytic anemia   Arthritis    Bradycardia    chronic   CAD (coronary artery disease)    Dyslipidemia    Eczema    Hyperlipidemia    Hypothyroid    Loss of weight    Macrocytosis    with normal bone marrow biopsy in 2006 may be benign varient, evaluated in the pastby hematologistDr. Benay Spice   Osteopenia    on bone density test from orthopedist Dr. Nelva Bush, bone density test in July 2010 showed T score-1.1 in the lumbar spine and 2.1 in the femur. Bone density in August 2012 showed T score 2.3 in the femur and 2.2 in the forearm   Postsurgical aortocoronary bypass status    Prostate cancer [facility])    H/O   Prostate cancer [facility])    followed by urology Dr. 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   Need for follow-up by home health service 06/08/2020   Atherosclerosis of native coronary artery of native heart without angina pectoris 02/24/2014   S/P CABG (coronary artery bypass graft) 02/24/2014   Bradycardia 02/24/2014   Right inguinal hernia 10/21/2012    REFERRING DIAG: Lt thigh sarcoma  THERAPY DIAG:  Decreased ROM of left knee  Muscle  weakness (generalized)  Difficulty in walking, not elsewhere classified  Pain in left leg  Decreased ROM of right knee  Lymphedema, not elsewhere classified  PERTINENT HISTORY: L thigh sarcoma, currently undergoing radiation, underwent sarcoma removal surgery on 04/11/21, heart bypass surgery, gallbladder removed, prostate cancer   PRECAUTIONS: Falls risk, active cancer   SUBJECTIVE: Patient arrives with wife.  He denies any pain and is in no acute distress.  He is due for reassessment visit today.  PAIN:  Are you having pain? No   Objective measures:  Lt knee P/ROM: extension: 0 degrees, flexion 105 degrees Lt knee Strength: Flexion: 4+/5, extension 4+/5      10/13/21 TODAY'S TREATMENT: Exercise:  Re-assessment completed PROM seated with balance pad in chair x 5 holding 10 seconds into knee flexion on left Instructed in use of leg press for him to be able to do at his gym: 70 lb 2 sets of 10 Piriformis stretch seated and supine with strap: printed for HEP Long arc quads: 6# 2x10                Sit to stand: 23" (2 pads in chair)  2x10                Educated on various equipment he could use at his gym: Multi-hip introduced  70 lbs   10/11/21 TODAY'S TREATMENT: Exercise:  NuStep: Level 5x 8 minutes- PT present to discuss progress.  PROM seated with balance pad in chair x 5 holding 10 seconds into knee flexion on left Instructed/reviewed transfer in/out car on drivers side x 2 Instructed in propping left foot on stepstool to tie left shoe; patient unable to reach shoe, therefore we worked on segmental stretching to achieve reaching his shoe( foot on stool grabbing knee and pulliing fwd,  strap on foot and stretching into piriformis position) Long arc quads: 4# 2x10 Seated knee flexion- green band 2x10 Sit to stand: 23" (2 pads in chair)  2x10- emphasis on keeping knee in flexion and even with Rt LE  Manual:  P/ROM into knee extension: 10' hold x10- pt achieved 0 degrees of  knee flexion  10/06/21 TODAY'S TREATMENT: Exercise:  NuStep: Level 5x 8 minutes- PT present to discuss progress.  Long arc quads: 4# 2x10 Seated knee flexion- green band 2x10 Sit to stand: 23" (2 pads in chair)  2x10- emphasis on keeping knee in flexion and even with Rt LE 8" step-ups 2x10- parallel bars elevated to 42 for this Stretch into flexion on 8" step x 10 Step over hurdles in parallel bars- forward and sidestepping x 4 laps in parallel bars each. Able to do without UE support today. Supine heel slide stretch with green strap: 10" x10 Manual:  P/ROM into knee extension: 10' hold x10- pt achieved 0 degrees of knee flexion   10/04/21 TODAY'S TREATMENT:  Exercise:  NuStep: Level 5x 8 minutes- PT present to discuss progress.  Long arc quads: 4# 2x10 Seated knee flexion- green band 2x10 Sit to stand: 22" mat table 2x10- emphasis on keeping knee in flexion and even with Rt LE  8" step-ups 2x10- parallel bars elevated to 42 for this- emphasis on quad activation and reducing momentum and UE use. Stretch into flexion on 8" step x 10 Step over hurdles in parallel bars- forward and sidestepping x 4 laps in parallel bars each.    Manual:  Mulligan strap used for seated Lt knee mobs, patellar mobs, superior/inferior and tibial mobs anterior/posterior   HOME EXERCISE PROGRAM: Added Piriformis stretch seated and supine: see access code  GOALS: Goals reviewed with patient? Yes  LONG TERM GOALS:   LTG Name Target Date Goal status  1 report 50% increased ease with toiliting due to improved functional Lt knee flexion  Baseline: 11/01/21 Met  2 Pt will demonstrate 110 degrees of bilateral knee flexion to allow pt to stand from a chair with safety  Baseline: 100  11/01/21 IN PROGRESS  3 Pt will demonstrate ability to sit to stand from a chair with minimal use of UEs due to improved Lt knee flexion  Baseline: continues to have reduced Lt knee flexion and required moderate UE support (10/06/21)  11/01/21 IN PROGRESS  4 Pt will be independent in a home exercise program for continued strengthening and stretching  Baseline: 11/01/21 IN PROGRESS                  PLAN: Patient continues to have difficulty coming to stand.  But wife admits, also, that family tends to jump up to help him every time and he doesn't hesitate to accept the help.  His gait is normalizing and he is able to come to stand using his hands from most levels.  He transfers in/out of car with ease on both driver and passenger side.  He would benefit from  continuing skilled PT to focus on function left LE ROM.     PT FREQUENCY: 2x/week  PT DURATION: 8 weeks  PLANNED INTERVENTIONS: Therapeutic exercises, Therapeutic activity, Neuro Muscular re-education, Balance training, Gait training, Patient/Family education, Joint mobilization, Stair training, Taping, and Manual therapy  PLAN FOR NEXT SESSION: Work on Lt knee flexion, strength, sit to stand and stability.  Lower table height for sit to stand.   Anderson Malta B. Donielle Radziewicz, PT 02/21/238:10 AM

## 2021-10-18 ENCOUNTER — Ambulatory Visit: Payer: Medicare Other

## 2021-10-18 ENCOUNTER — Other Ambulatory Visit: Payer: Self-pay

## 2021-10-18 DIAGNOSIS — M25662 Stiffness of left knee, not elsewhere classified: Secondary | ICD-10-CM | POA: Diagnosis not present

## 2021-10-18 DIAGNOSIS — M6281 Muscle weakness (generalized): Secondary | ICD-10-CM

## 2021-10-18 DIAGNOSIS — M25661 Stiffness of right knee, not elsewhere classified: Secondary | ICD-10-CM

## 2021-10-18 DIAGNOSIS — I890 Lymphedema, not elsewhere classified: Secondary | ICD-10-CM

## 2021-10-18 DIAGNOSIS — M79605 Pain in left leg: Secondary | ICD-10-CM

## 2021-10-18 DIAGNOSIS — R262 Difficulty in walking, not elsewhere classified: Secondary | ICD-10-CM

## 2021-10-18 NOTE — Patient Instructions (Signed)
Continue working on hamstring and hip rotation stretches to achieve goal of being able to tie his shoe

## 2021-10-18 NOTE — Therapy (Signed)
[facility] @ [address] [place], [place], [address] Phone: [phone]   Fax:  [phone]  Physical Therapy Treatment  Patient Details  Name: Joseph Werner MRN: [id number] Date of Birth: [birth year] Referring Provider (PT): Shona Simpson   Encounter Date: 10/18/2021   PT End of Session - 10/18/21 1052     Visit Number 3    Number of Visits 69    Date for PT Re-Evaluation 11/01/21    Authorization Type begin kx after visit 55(equal to visit 4 in the new year)    Progress Note Due on Visit 31    PT Start Time 1050    PT Stop Time 1143    PT Time Calculation (min) 53 min    Activity Tolerance Patient tolerated treatment well    Behavior During Therapy [facility] for tasks assessed/performed             Past Medical History:  Diagnosis Date   Anemia    macrocytic anemia   Arthritis    Bradycardia    chronic   CAD (coronary artery disease)    Dyslipidemia    Eczema    Hyperlipidemia    Hypothyroid    Loss of weight    Macrocytosis    with normal bone marrow biopsy in 2006 may be benign varient, evaluated in the pastby hematologistDr. Benay Spice   Osteopenia    on bone density test from orthopedist Dr. Nelva Bush, bone density test in July 2010 showed T score-1.1 in the lumbar spine and 2.1 in the femur. Bone density in August 2012 showed T score 2.3 in the femur and 2.2 in the forearm   Postsurgical aortocoronary bypass status    Prostate cancer [facility])    H/O   Prostate cancer [facility])    followed by urology Dr. Diona Fanti    Past Surgical History:  Procedure Laterality Date   [place] Right 11/18/2012   Procedure: HERNIA REPAIR INGUINAL ADULT;  Surgeon: Merrie Roof, MD;  Location: [place];  Service: General;  Laterality: Right;   INSERTION OF MESH Right 11/18/2012   Procedure: INSERTION OF MESH;   Surgeon: Merrie Roof, MD;  Location: [place];  Service: General;  Laterality: Right;   PROSTATECTOMY      There were no vitals filed for this visit.   Subjective Assessment - 10/18/21 1054     Subjective Patient arrives 15 min early.  He denies any pain and is in no acute distress.    Pertinent History L thigh sarcoma, currently undergoing radiation, underwent sarcoma removal surgery on 04/11/21, heart bypass surgery, gallbladder removed, prostate cancer    Patient Stated Goals to wake up one morning without any pain and be able to walk across the room like nothing happened    Pain Onset In the past 7 days                               [facility] Adult PT Treatment/Exercise - 10/18/21 0001       Knee/Hip Exercises: Stretches   Active Hamstring Stretch Left;3 reps;30 seconds    Active Hamstring Stretch Limitations supine using stretch strap    Piriformis Stretch Both;3 reps;30 seconds    Other Knee/Hip Stretches knee flexion with strap x  10      Knee/Hip Exercises: Aerobic   Nustep Level 6 x 8 min for ROM with arms at 11 and seat at 13      Knee/Hip Exercises: Standing   Forward Step Up Left;1 set;10 reps;Hand Hold: 0;Step Height: 8"    Rocker Board 2 minutes    Walking with Sports Cord lateral band walks (red) x 5 laps    Other Standing Knee Exercises step over 6" hurdles to encourage Lt knee flexion forward and sidestepping x4 laps in // bars      Knee/Hip Exercises: Seated   Long Arc Quad Strengthening;Left;2 sets;10 reps;Weights    Long Arc Quad Weight 6 lbs.    Long [employer] Limitations focus on eccentric control    Other Seated Knee/Hip Exercises seated piriformis stretch with strap (patient wants to be able to tie shoe on left) 5 x 15 sec    Hamstring Curl Strengthening;Left;2 sets;10 reps;Limitations    Hamstring Limitations green    Sit to Sand 2 sets;10 reps;without UE support;Other (comment)   used hi-lo table to improve knee flexion with this-22  inches                         PT Long Term Goals - 10/18/21 1146       PT LONG TERM GOAL #1   Title report 50% increased ease with toiliting due to improved functional Lt knee flexion    Baseline significant difficulty with this activity    Time 6    Period Weeks    Status Achieved    Target Date 11/01/21                   Plan - 10/18/21 1139     Clinical Impression Statement Patient is progressing appropriately.  He is gaining hip ROM based on his ability to bring his foot higher on his knee during piriformis stretch.  He is compliant and well motivated and should continue to improve.  He would benefit from continued skilled PT for left knee and hip ROM to restore function and safe, independent ambulation and transfers.     Personal Factors and Comorbidities Age;Comorbidity 1    Comorbidities previous hx of prostate cancer    Examination-Activity Limitations Stand;Sit    Examination-Participation Restrictions Driving;Yard Work;Community Activity    Stability/Clinical Decision Making Stable/Uncomplicated    Clinical Decision Making Low    Rehab Potential Good    PT Frequency 2x / week    PT Duration 6 weeks    PT Treatment/Interventions ADLs/Self Care Home Management;Therapeutic exercise;Therapeutic activities;Patient/family education;Manual techniques;Manual lymph drainage;Compression bandaging;Taping;Vasopneumatic Device;Gait training;Stair training;Passive range of motion;Scar mobilization;Joint Manipulations    PT Next Visit Plan cont ROM of bilat knees with focus on Lt  knee, balance, strength, endurance,  sit to stand working on left knee flexion and weight shift equally on bilateral LE's as well as fwd weight shift.    PT Home Exercise Plan Access Code: [email or web address]    Consulted and Agree with Plan of Care Patient    Family Member Consulted wife             Patient will benefit from skilled therapeutic intervention in order to improve the  following deficits and impairments:  Pain, Postural dysfunction, Increased fascial restricitons, Decreased strength, Decreased range of motion, Decreased scar mobility, Increased edema, Difficulty walking, Decreased skin integrity, Decreased balance  Visit Diagnosis: Decreased ROM of left knee  Muscle weakness (generalized)  Difficulty in walking, not elsewhere classified  Pain in left leg  Decreased ROM of right knee  Lymphedema, not elsewhere classified     Problem List Patient Active Problem List   Diagnosis Date Noted   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 Sophia Cubero, PT 10/18/2309:47 AM   [facility] @ [address] [place], [place], [address] Phone: [phone]   Fax:  [phone]  Name: Joseph Werner MRN: [id number] Date of Birth: 04-16-1935

## 2021-10-20 ENCOUNTER — Ambulatory Visit: Payer: Medicare Other

## 2021-10-20 ENCOUNTER — Other Ambulatory Visit: Payer: Self-pay

## 2021-10-20 DIAGNOSIS — M25662 Stiffness of left knee, not elsewhere classified: Secondary | ICD-10-CM

## 2021-10-20 DIAGNOSIS — M79605 Pain in left leg: Secondary | ICD-10-CM

## 2021-10-20 DIAGNOSIS — M6281 Muscle weakness (generalized): Secondary | ICD-10-CM

## 2021-10-20 DIAGNOSIS — R262 Difficulty in walking, not elsewhere classified: Secondary | ICD-10-CM

## 2021-10-20 NOTE — Therapy (Signed)
OUTPATIENT PHYSICAL THERAPY TREATMENT NOTE   Patient Name: Joseph Werner MRN: [id number] DOB:03-14-1935, 87 y.o., male Today's Date: 10/20/2021  PCP: Kristen Loader, FNP REFERRING PROVIDER: Shona Simpson, [facility]   PT End of Session - 10/20/21 1144     Visit Number 67    Number of Visits 63    Date for PT Re-Evaluation 11/01/21    Authorization Type begin kx after visit 55(equal to visit 17 in the new year)    Progress Note Due on Visit 36    PT Start Time 1140    PT Stop Time 1225    PT Time Calculation (min) 45 min    Activity Tolerance Patient tolerated treatment well    Behavior During Therapy [facility] for tasks assessed/performed              Past Medical History:  Diagnosis Date   Anemia    macrocytic anemia   Arthritis    Bradycardia    chronic   CAD (coronary artery disease)    Dyslipidemia    Eczema    Hyperlipidemia    Hypothyroid    Loss of weight    Macrocytosis    with normal bone marrow biopsy in 2006 may be benign varient, evaluated in the pastby hematologistDr. Benay Spice   Osteopenia    on bone density test from orthopedist Dr. Nelva Bush, bone density test in July 2010 showed T score-1.1 in the lumbar spine and 2.1 in the femur. Bone density in August 2012 showed T score 2.3 in the femur and 2.2 in the forearm   Postsurgical aortocoronary bypass status    Prostate cancer [facility])    H/O   Prostate cancer [facility])    followed by urology Dr. 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   Need for follow-up by home health service 06/08/2020   Atherosclerosis of native coronary artery of native heart without angina pectoris 02/24/2014   S/P CABG (coronary artery bypass graft) 02/24/2014   Bradycardia 02/24/2014   Right inguinal hernia 10/21/2012    REFERRING DIAG: Lt thigh sarcoma  THERAPY DIAG:  Decreased ROM of left knee  Muscle weakness (generalized)  Difficulty in walking, not elsewhere classified  Pain in left leg  PERTINENT HISTORY: L thigh sarcoma, currently undergoing radiation, underwent sarcoma removal surgery on 04/11/21, heart bypass surgery, gallbladder removed, prostate cancer   PRECAUTIONS: Falls risk, active cancer   SUBJECTIVE: Patient denies any pain and is in no acute distress.   PAIN:  Are you having pain? No  Objective measures: Lt knee P/ROM: extension: 0 degrees, flexion 100 degrees  10/20/21 TODAY'S TREATMENT: Exercise:  NuStep: Level 5x 8 minutes- PT present to discuss progress.  Long arc quads: 6# 2x10 Seated knee flexion- green band 2x10 Hip IR's stretch with strap in sitting x 10 hold 10 sec (for goal of  being able to tie right shoe) Sit to stand: 23" (1 pads on treatment table)  2x10- emphasis on keeping knee in flexion and even with Rt LE Toe raises in parallel bars x 20 8" step-ups 2x10  8" step downs 2 x 10 Hamstring stretch standing in parallel bars (8" step and step stool on top step) Step up and over 8" step x 10 Stretch into flexion on 8" step x 10  10/18/21 TODAY'S TREATMENT:       Knee/Hip Exercises: Stretches    Active Hamstring Stretch Left;3 reps;30 seconds     Active Hamstring Stretch Limitations supine using stretch strap     Piriformis Stretch Both;3 reps;30 seconds     Other Knee/Hip Stretches knee flexion with strap x 10          Knee/Hip Exercises: Aerobic    Nustep Level 6 x 8 min for ROM with arms at 11 and seat at 13           Knee/Hip Exercises: Standing    Forward Step Up Left;1 set;10 reps;Hand Hold: 0;Step Height: 8"     Rocker Board 2 minutes     Walking with Sports Cord lateral band walks (red) x 5 laps     Other Standing Knee Exercises step over 6" hurdles to encourage Lt knee flexion forward and sidestepping x4 laps in // bars          Knee/Hip Exercises: Seated    Long Arc Quad Strengthening;Left;2 sets;10 reps;Weights     Long Arc Quad Weight 6 lbs.     Long [employer] Limitations focus on eccentric control     Other Seated Knee/Hip Exercises seated piriformis stretch with strap (patient wants to be able to tie shoe on left) 5 x 15 sec     Hamstring Curl Strengthening;Left;2 sets;10 reps;Limitations     Hamstring Limitations green     Sit to Sand 2 sets;10 reps;without UE support;Other (comment)   used hi-lo table to improve knee flexion with this-22 inches      10/13/21 TODAY'S TREATMENT: Exercise:  Re-assessment completed PROM seated with balance pad in chair x 5 holding 10 seconds into knee flexion on left Instructed in use of leg press for him to be able to do at his gym: 70 lb 2 sets of 10 Piriformis stretch seated and supine with strap: printed for HEP Long arc quads: 6# 2x10                Sit to stand: 23" (2 pads in chair)  2x10                Educated on various equipment he could use at his gym: Multi-hip introduced 70 lbs     HOME EXERCISE PROGRAM: NA  GOALS: Goals reviewed with patient? Yes  LONG TERM GOALS:   LTG Name Target Date Goal status  1 report 50% increased ease with toiliting due to improved functional Lt knee flexion  Baseline: 11/01/21 IN PROGRESS  2 Pt will demonstrate 110 degrees of bilateral knee flexion to allow pt to stand from a chair with safety  Baseline: 100  11/01/21 IN PROGRESS  3 Pt will demonstrate ability to sit to stand from a chair with minimal use of UEs due to improved Lt knee flexion  Baseline: continues to have reduced Lt knee flexion and  required moderate UE support (10/06/21) 11/01/21 IN PROGRESS  4 Pt will be independent in a home exercise program for continued strengthening and  stretching  Baseline: 11/01/21 IN PROGRESS                  PLAN: Pt continues to show improvement in knee flexion ROM functionally.  His primary limitation for his goals of tying his shoes is his left hip mobility.  He is showing progress with hip external rotation and is very diligent with his HEP and has initiated a workout routine at his local fitness facility.     PT FREQUENCY: 2x/week  PT DURATION: 8 weeks  PLANNED INTERVENTIONS: Therapeutic exercises, Therapeutic activity, Neuro Muscular re-education, Balance training, Gait training, Patient/Family education, Joint mobilization, Stair training, Taping, and Manual therapy  PLAN FOR NEXT SESSION: Continue to work on Lt knee flexion and global hip mobility, standing hamstring stretching, strength, sit to stand and stability.  Lower table height for sit to stand.   Anderson Malta B. Stryder Poitra, PT 02/23/232:45 PM

## 2021-10-25 ENCOUNTER — Ambulatory Visit: Payer: Medicare Other

## 2021-10-25 ENCOUNTER — Other Ambulatory Visit: Payer: Self-pay

## 2021-10-25 DIAGNOSIS — R262 Difficulty in walking, not elsewhere classified: Secondary | ICD-10-CM

## 2021-10-25 DIAGNOSIS — M25661 Stiffness of right knee, not elsewhere classified: Secondary | ICD-10-CM

## 2021-10-25 DIAGNOSIS — M25662 Stiffness of left knee, not elsewhere classified: Secondary | ICD-10-CM

## 2021-10-25 DIAGNOSIS — M79605 Pain in left leg: Secondary | ICD-10-CM

## 2021-10-25 DIAGNOSIS — M6281 Muscle weakness (generalized): Secondary | ICD-10-CM

## 2021-10-25 DIAGNOSIS — I890 Lymphedema, not elsewhere classified: Secondary | ICD-10-CM

## 2021-10-25 NOTE — Therapy (Signed)
OUTPATIENT PHYSICAL THERAPY TREATMENT NOTE   Patient Name: Joseph Werner MRN: [id number] DOB:1934/08/31, 87 y.o., male Today's Date: 10/25/2021  PCP: Kristen Loader, [place] REFERRING PROVIDER: Shona Simpson, [facility]   PT End of Session - 10/25/21 1108     Visit Number 29    Number of Visits 56    Date for PT Re-Evaluation 11/01/21    Authorization Type begin kx after visit 55(equal to visit 75 in the new year)    Progress Note Due on Visit 69    PT Start Time 1100    PT Stop Time 1145    PT Time Calculation (min) 45 min    Activity Tolerance Patient tolerated treatment well    Behavior During Therapy [facility] for tasks assessed/performed              Past Medical History:  Diagnosis Date   Anemia    macrocytic anemia   Arthritis    Bradycardia    chronic   CAD (coronary artery disease)    Dyslipidemia    Eczema    Hyperlipidemia    Hypothyroid    Loss of weight    Macrocytosis    with normal bone marrow biopsy in 2006 may be benign varient, evaluated in the pastby hematologistDr. Benay Spice   Osteopenia    on bone density test from orthopedist Dr. Nelva Bush, bone density test in July 2010 showed T score-1.1 in the lumbar spine and 2.1 in the femur. Bone density in August 2012 showed T score 2.3 in the femur and 2.2 in the forearm   Postsurgical aortocoronary bypass status    Prostate cancer [facility])    H/O   Prostate cancer [facility])    followed by urology Dr. 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   Need for follow-up by home health service 06/08/2020   Atherosclerosis of native coronary artery of native heart without angina pectoris 02/24/2014   S/P CABG (coronary artery bypass graft) 02/24/2014   Bradycardia 02/24/2014   Right inguinal hernia 10/21/2012    REFERRING DIAG: Lt thigh sarcoma  THERAPY DIAG:  Decreased ROM of left knee  Muscle weakness (generalized)  Difficulty in walking, not elsewhere classified  Pain in left leg  Decreased ROM of right knee  Lymphedema, not elsewhere classified  PERTINENT HISTORY: L thigh sarcoma, currently undergoing radiation, underwent sarcoma removal surgery on 04/11/21, heart bypass surgery, gallbladder removed, prostate cancer   PRECAUTIONS: Falls risk, active cancer   SUBJECTIVE: Patient states he is doing well.  He is working on getting his leg up to tie his shoe.   PAIN:  Are you having pain? No  Objective measures: Lt knee P/ROM: extension: 0 degrees, flexion 100 degrees    10/25/21 TODAY'S TREATMENT: Exercise:  NuStep: Level 5x 8 minutes- PT present to discuss progress.  Long arc quads: 6#  2x10 Seated knee flexion- green band 2x10 Hip IR's stretch with strap in sitting x 10 hold 10 sec (for goal of being able to tie right shoe) Sit to stand: 23" (1 pads on treatment table)  2x10- emphasis on keeping knee in flexion and even with Rt LE (added 10 lb dumbell) Toe raises in parallel bars x 20 8" step-ups 2x10  8" step downs 2 x 10 Hamstring stretch standing in parallel bars (8" step and step stool on top step) Step up and over 8" step x 10 Stretch into flexion on 8" step x 10 Supine piriformis/ hip IR's stretch 3 x 30 sec   10/20/21 TODAY'S TREATMENT: Exercise:  NuStep: Level 5x 8 minutes- PT present to discuss progress.  Long arc quads: 6# 2x10 Seated knee flexion- green band 2x10 Hip IR's stretch with strap in sitting x 10 hold 10 sec  (for goal of being able to tie right shoe) Sit to stand: 23" (1 pads on treatment table)  2x10- emphasis on keeping knee in flexion and even with Rt LE Toe raises in parallel bars x 20 8" step-ups 2x10  8" step downs 2 x 10 Hamstring stretch standing in parallel bars (8" step and step stool on top step) Step up and over 8" step x 10 Stretch into flexion on 8" step x 10  10/18/21 TODAY'S TREATMENT:       Knee/Hip Exercises: Stretches    Active Hamstring Stretch Left;3 reps;30 seconds     Active Hamstring Stretch Limitations supine using stretch strap     Piriformis Stretch Both;3 reps;30 seconds     Other Knee/Hip Stretches knee flexion with strap x 10          Knee/Hip Exercises: Aerobic    Nustep Level 6 x 8 min for ROM with arms at 11 and seat at 13          Knee/Hip Exercises: Standing    Forward Step Up Left;1 set;10 reps;Hand Hold: 0;Step Height: 8"     Rocker Board 2 minutes     Walking with Sports Cord lateral band walks (red) x 5 laps     Other Standing Knee Exercises step over 6" hurdles to encourage Lt knee flexion forward and sidestepping x4 laps in // bars          Knee/Hip Exercises: Seated    Long Arc Quad Strengthening;Left;2 sets;10 reps;Weights     Long Arc Quad Weight 6 lbs.     Long [employer] Limitations focus on eccentric control     Other Seated Knee/Hip Exercises seated piriformis stretch with strap (patient wants to be able to tie shoe on left) 5 x 15 sec     Hamstring Curl Strengthening;Left;2 sets;10 reps;Limitations     Hamstring Limitations green     Sit to Sand 2 sets;10 reps;without UE support;Other (comment)   used hi-lo table to improve knee flexion with this-22 inches         HOME EXERCISE PROGRAM: NA  GOALS: Goals reviewed with patient? Yes  LONG TERM GOALS:   LTG Name Target Date Goal status  1 report 50% increased ease with toiliting due to improved functional Lt knee flexion  Baseline: 11/01/21 IN PROGRESS  2 Pt will demonstrate 110  degrees of bilateral knee flexion to allow pt to stand from a chair with safety  Baseline: 100  11/01/21 IN PROGRESS  3 Pt will demonstrate ability to sit to stand from a chair with minimal use of UEs due  to improved Lt knee flexion  Baseline: continues to have reduced Lt knee flexion and required moderate UE support (10/06/21) 11/01/21 IN PROGRESS  4 Pt will be independent in a home exercise program for continued strengthening and stretching  Baseline: 11/01/21 IN PROGRESS                  PLAN: Pt continues to show improvement in hip flexion and ER to allow him to meet his goal of tying his left shoe.  He is very diligent with his HEP and works out at his [employer].  He continues to need skilled PT for various guided stretches and strengthening to reach his goal of tying his shoe on left LE.    PT FREQUENCY: 2x/week  PT DURATION: 8 weeks  PLANNED INTERVENTIONS: Therapeutic exercises, Therapeutic activity, Neuro Muscular re-education, Balance training, Gait training, Patient/Family education, Joint mobilization, Stair training, Taping, and Manual therapy  PLAN FOR NEXT SESSION: Continue to work on Lt knee flexion and global hip mobility, standing hamstring stretching, strength, sit to stand and stability.  Lower table height for sit to stand.   Anderson Malta B. Donne Robillard, PT 10/25/2309:30 PM

## 2021-10-27 ENCOUNTER — Ambulatory Visit: Payer: Medicare Other | Attending: Orthopedic Surgery

## 2021-10-27 ENCOUNTER — Other Ambulatory Visit: Payer: Self-pay

## 2021-10-27 DIAGNOSIS — M79605 Pain in left leg: Secondary | ICD-10-CM | POA: Diagnosis present

## 2021-10-27 DIAGNOSIS — M6281 Muscle weakness (generalized): Secondary | ICD-10-CM

## 2021-10-27 DIAGNOSIS — M25662 Stiffness of left knee, not elsewhere classified: Secondary | ICD-10-CM | POA: Diagnosis not present

## 2021-10-27 DIAGNOSIS — R262 Difficulty in walking, not elsewhere classified: Secondary | ICD-10-CM

## 2021-10-27 NOTE — Therapy (Signed)
?OUTPATIENT PHYSICAL THERAPY DISCHARGE NOTE ? ? ?Patient Name: Joseph Werner ?MRN: [id number] ?DOB:[birth year], 87 y.o., male ?Today's Date: 10/27/2021 ? ?PCP: Kristen Loader, FNP ?REFERRING PROVIDER: Shona Simpson, [facility] ? ? PT End of Session - 10/27/21 1106   ? ? Visit Number 42   ? Number of Visits 45   ? Date for PT Re-Evaluation 11/01/21   ? Authorization Type begin kx after visit 55(equal to visit 15 in the new year)   ? PT Start Time 1100   ? PT Stop Time 1147   ? PT Time Calculation (min) 47 min   ? Activity Tolerance Patient tolerated treatment well   ? Behavior During Therapy [facility] for tasks assessed/performed   ? ?  ?  ? ?  ? ? ? ?Past Medical History:  ?Diagnosis Date  ? Anemia   ? macrocytic anemia  ? Arthritis   ? Bradycardia   ? chronic  ? CAD (coronary artery disease)   ? Dyslipidemia   ? Eczema   ? Hyperlipidemia   ? Hypothyroid   ? Loss of weight   ? Macrocytosis   ? with normal bone marrow biopsy in 2006 may be benign varient, evaluated in the pastby hematologistDr. Benay Spice  ? Osteopenia   ? on bone density test from orthopedist Dr. Nelva Bush, bone density test in July 2010 showed T score-1.1 in the lumbar spine and 2.1 in the femur. Bone density in August 2012 showed T score 2.3 in the femur and 2.2 in the forearm  ? Postsurgical aortocoronary bypass status   ? Prostate cancer ([place])   ? H/O  ? Prostate cancer ([place])   ? followed by urology Dr. Diona Fanti  ? ?Past Surgical History:  ?Procedure Laterality Date  ? APPENDECTOMY    ? CHOLECYSTECTOMY  1982  ? CORONARY ARTERY BYPASS GRAFT  1998  ? HERNIA REPAIR    ? INGUINAL HERNIA REPAIR Right 11/18/2012  ? Procedure: HERNIA REPAIR INGUINAL ADULT;  Surgeon: Merrie Roof, MD;  Location: [place];  Service: General;  Laterality: Right;  ? INSERTION OF MESH Right 11/18/2012  ? Procedure: INSERTION OF MESH;  Surgeon: Merrie Roof, MD;  Location: [place];  Service: General;  Laterality: Right;  ? PROSTATECTOMY    ? ?Patient Active Problem List  ? Diagnosis Date  Noted  ? 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 Need for follow-up by home health service 06/08/2020  ? Atherosclerosis of native coronary artery of native heart without angina pectoris 02/24/2014  ? S/P CABG (coronary artery bypass graft) 02/24/2014  ? Bradycardia 02/24/2014  ? Right inguinal hernia 10/21/2012  ? ? ?REFERRING DIAG: Lt thigh sarcoma ? ?THERAPY DIAG:  ?Decreased ROM of left knee ? ?Muscle weakness (generalized) ? ?Difficulty in walking, not elsewhere classified ? ?Pain in left leg ? ?PERTINENT HISTORY: L thigh sarcoma, currently undergoing radiation, underwent sarcoma removal surgery on 04/11/21, heart bypass surgery, gallbladder removed, prostate cancer  ? ?PRECAUTIONS: Falls risk, active cancer  ? ?SUBJECTIVE: Patient arrives with wife.  He has no complaints and no pain.  We discuss that his re-eval date is set for next week but that he has met max potential and 95% of his goals.  He is working out regularly at [employer] and doing his HEP consistently.  He agrees to DC today.   ? ?PAIN:  ?Are you having pain? No ? ?Objective measures: Lt knee P/ROM: extension: -18 degrees active (0 degrees passive) , flexion 102 degrees  active (105 passive) ? ?10/26/21 TODAY'S TREATMENT: ?Exercise:  ?NuStep: Level 5x 8 minutes- PT present to discuss DC plan.  ?Long arc quads: 6# 2x10 ?Seated knee flexion- green band 2x10 ?Hip IR's stretch with strap in sitting x 10 hold 10 sec (for goal of being able to tie right shoe) ?Sit to stand: 23" (1 pads on treatment table)  2x10- emphasis on keeping knee in flexion and even with Rt LE (added 10 lb dumbell) ?Reviewed all of HEP and made updates to position and use of resistance.   ?DC instructions and plan discussed. ? ?  10/25/21 TODAY'S TREATMENT: ?Exercise:  ?NuStep: Level 5x 8 minutes- PT present to discuss progress.  ?Long arc quads: 6# 2x10 ?Seated knee flexion- green band 2x10 ?Hip IR's stretch with strap in sitting x 10 hold 10 sec (for goal of being able to tie right  shoe) ?Sit to stand: 23" (1 pads on treatment table)  2x10- emphasis on keeping knee in flexion and even with Rt LE (added 10 lb dumbell) ?Toe raises in parallel bars x 20 ?8" step-ups 2x10  ?8" step downs 2 x 10 ?Hamstring stretch standing in parallel bars (8" step and step stool on top step) ?Step up and over 8" step x 10 ?Stretch into flexion on 8" step x 10 ?Supine piriformis/ hip IR's stretch 3 x 30 sec ? ? ?10/20/21 TODAY'S TREATMENT: ?Exercise:  ?NuStep: Level 5x 8 minutes- PT present to discuss progress.  ?Long arc quads: 6# 2x10 ?Seated knee flexion- green band 2x10 ?Hip IR's stretch with strap in sitting x 10 hold 10 sec (for goal of being able to tie right shoe) ?Sit to stand: 23" (1 pads on treatment table)  2x10- emphasis on keeping knee in flexion and even with Rt LE ?Toe raises in parallel bars x 20 ?8" step-ups 2x10  ?8" step downs 2 x 10 ?Hamstring stretch standing in parallel bars (8" step and step stool on top step) ?Step up and over 8" step x 10 ?Stretch into flexion on 8" step x 10 ? ? ?  ?HOME EXERCISE PROGRAM:  Finalized HEP ?Access Code: [email or web address] ?URL: [email or web address] ?Date: 10/27/2021 ?Prepared by: Candyce Churn ? ?Exercises ?Seated Long Arc Quad - 1 x daily - 7 x weekly - 1 sets - 10 reps - 3 sec hold ?Seated Piriformis Stretch - 1 x daily - 7 x weekly - 1 sets - 10 reps - 10 hold ?Sit to Stand Without Arm Support - 1 x daily - 7 x weekly - 2 sets - 10 reps ?Supine Quad Set - 1 x daily - 7 x weekly - 1 sets - 10 reps - 5 sec hold ?Straight Leg Raise - 1 x daily - 7 x weekly - 2 sets - 10 reps ?Supine Hamstring Stretch with Strap - 2 x daily - 7 x weekly - 1 sets - 10 reps - 10 sec hold ?Supine Heel Slide with Strap - 2 x daily - 7 x weekly - 1 sets - 10 reps - 10 sec hold ?Supine Figure 4 Piriformis Stretch - 2 x daily - 7 x weekly - 1 sets - 10 reps - 10 hold ?Supine Piriformis Stretch with Foot on Ground - 1 x daily - 7 x weekly - 1 sets - 10 reps - 10 sec  hold ? ? ?GOALS: ?Goals reviewed with patient? Yes ? ?LONG TERM GOALS:  ? ?LTG Name Target Date Goal status  ?1 report 50% increased ease with toiliting due to improved  functional Lt knee flexion  ?Baseline: 11/01/21 Met  ?2 Pt will demonstrate 110 degrees of bilateral knee flexion to allow pt to stand from a chair with safety  ?Baseline: 100  11/01/21 Achieved 105 degrees on left  ?3 Pt will demonstrate ability to sit to stand from a chair with minimal use of UEs due to improved Lt knee flexion  ?Baseline: continues to have reduced Lt knee flexion and required moderate UE support (10/06/21) 11/01/21 Met  ?4 Pt will be independent in a home exercise program for continued strengthening and stretching  ?Baseline: 11/01/21 Met  ?     ?     ?     ? ?PLAN: Pt will be discharged at this time with all goals met with exception of tying his left shoe and 5 degrees of knee flexion.  He is going to his local gym 5 days per week and is diligent with his HEP.  He should continue to do well.   ? ?PT FREQUENCY: 2x/week ? ?PT DURATION: 8 weeks ? ?PLANNED INTERVENTIONS: Therapeutic exercises, Therapeutic activity, Neuro Muscular re-education, Balance training, Gait training, Patient/Family education, Joint mobilization, Stair training, Taping, and Manual therapy ? ?PLAN FOR NEXT SESSION: Continue to work on Lt knee flexion and global hip mobility, standing hamstring stretching, strength, sit to stand and stability.  Lower table height for sit to stand.  ? ?PHYSICAL THERAPY DISCHARGE SUMMARY ? ?Visits from Start of Care: 54 ? ?Current functional level related to goals / functional outcomes: ?See above ?  ?Remaining deficits: ?See above ?  ?Education / Equipment: ?See above  ? ?Patient agrees to discharge. Patient goals were met. Patient is being discharged due to meeting the stated rehab goals. ?  ?Anderson Malta B. Jaid Quirion, PT ?03/02/231:08 PM   ? ? ? ? ? ? ? ? ? ? ?   ?

## 2021-12-01 ENCOUNTER — Ambulatory Visit: Payer: Medicare Other | Admitting: Cardiology

## 2021-12-19 ENCOUNTER — Ambulatory Visit: Payer: Medicare Other | Admitting: Cardiology

## 2022-02-03 ENCOUNTER — Other Ambulatory Visit (HOSPITAL_COMMUNITY): Payer: Self-pay | Admitting: Orthopedic Surgery

## 2022-02-03 ENCOUNTER — Other Ambulatory Visit: Payer: Self-pay | Admitting: Orthopedic Surgery

## 2022-02-03 DIAGNOSIS — C499 Malignant neoplasm of connective and soft tissue, unspecified: Secondary | ICD-10-CM

## 2022-02-14 ENCOUNTER — Encounter (HOSPITAL_COMMUNITY): Payer: Self-pay

## 2022-02-14 ENCOUNTER — Ambulatory Visit (HOSPITAL_COMMUNITY)
Admission: RE | Admit: 2022-02-14 | Discharge: 2022-02-14 | Disposition: A | Discharge status: Home / Self Care | Payer: Medicare Other | Source: Ambulatory Visit | Attending: Orthopedic Surgery | Admitting: Orthopedic Surgery

## 2022-02-14 DIAGNOSIS — C499 Malignant neoplasm of connective and soft tissue, unspecified: Secondary | ICD-10-CM

## 2022-02-14 IMAGING — MR MR FEMUR*L* WO/W CM
7 of 8 series · 35 of 40 positions shown · IV contrast (gadavist)
Comparison: MRI examination dated [DATE] and [DATE]

CLINICAL DATA: Sarcoma six-month

EXAM:
MR OF THE LEFT LOWER EXTREMITY WITHOUT AND WITH CONTRAST
TECHNIQUE: Multiplanar, multisequence MR imaging of the left was performed both
before and after administration of intravenous contrast.
CONTRAST:  6mL GADAVIST GADOBUTROL 1 MMOL/ML IV SOLN

[Series 3: T1 · coronal · left · 4.0mm · 1.95mm/px · 4 of 30 slices shown (1 of 2)]
[im 1/30]
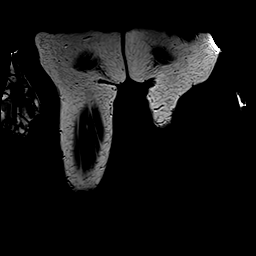
[im 10/30]
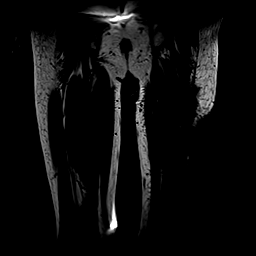
[im 20/30]
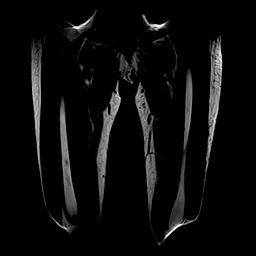
[im 30/30]
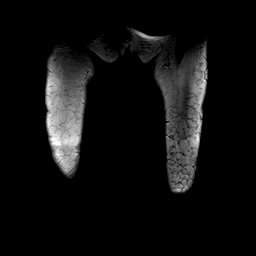

[Series 4: STIR · coronal · left · 4.0mm · 1.95mm/px · 4 of 30 slices shown (1 of 2)]
[im 1/30]
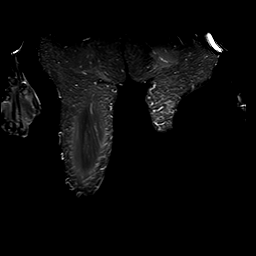
[im 10/30]
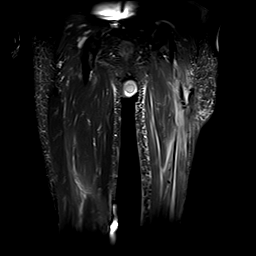
[im 20/30]
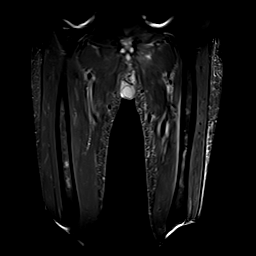
[im 30/30]
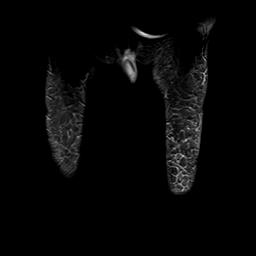

[Series 5: T1 · axial · left · 4.0mm · 1.02mm/px · z∈[-70,+169]mm · 6 of 50 slices shown (2 of 2)]
[im 1/50]
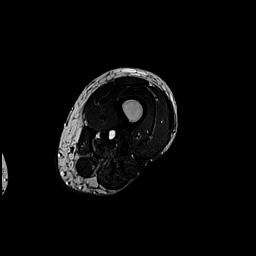
[im 10/50]
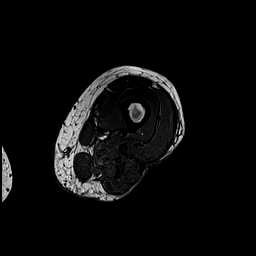
[im 20/50]
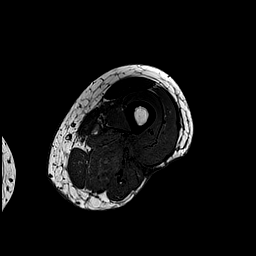
[im 30/50]
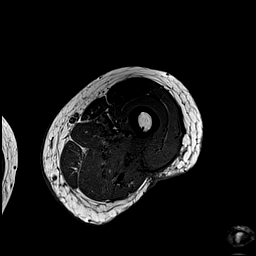
[im 40/50]
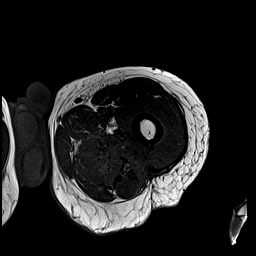
[im 50/50]
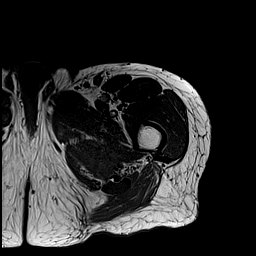

[Series 6: T2 fat-sat · axial · left · 4.0mm · 1.02mm/px · z∈[-70,+169]mm · 6 of 50 slices shown]
[im 1/50]
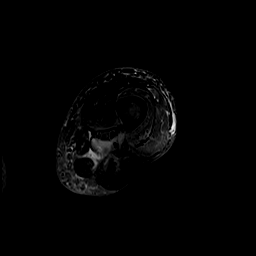
[im 10/50]
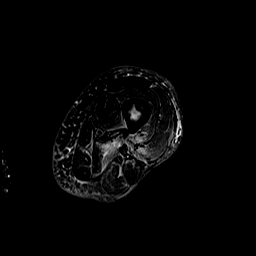
[im 20/50]
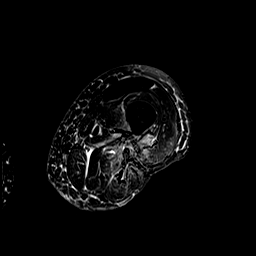
[im 30/50]
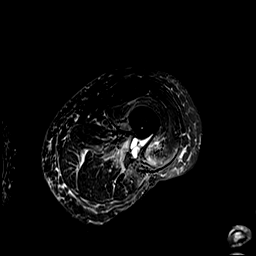
[im 40/50]
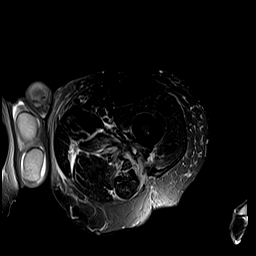
[im 50/50]
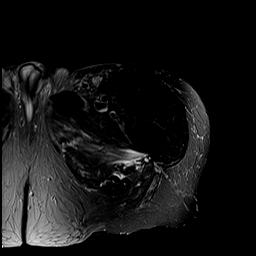

[Series 7: STIR · sagittal · left · 4.0mm · 1.72mm/px · 4 of 32 slices shown (2 of 2)]
[im 1/32]
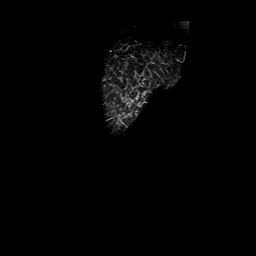
[im 11/32]
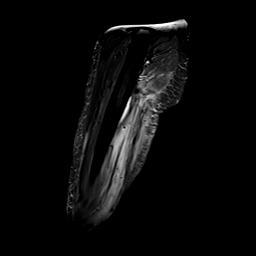
[im 21/32]
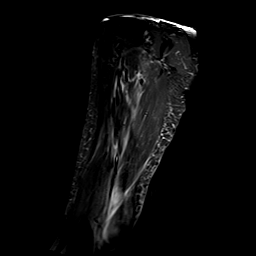
[im 32/32]
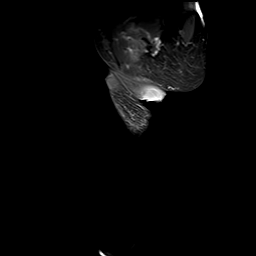

[Series 8: pre axial ti · axial · non-contrast · left · 4.0mm · 0.51mm/px · z∈[-109,+126]mm · 6 of 48 slices shown]
[im 1/48]
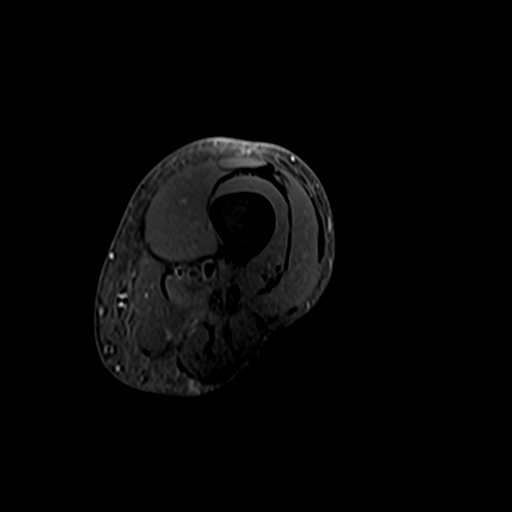
[im 10/48]
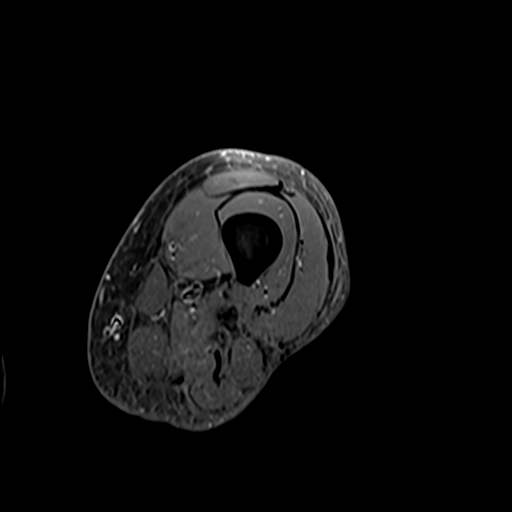
[im 19/48]
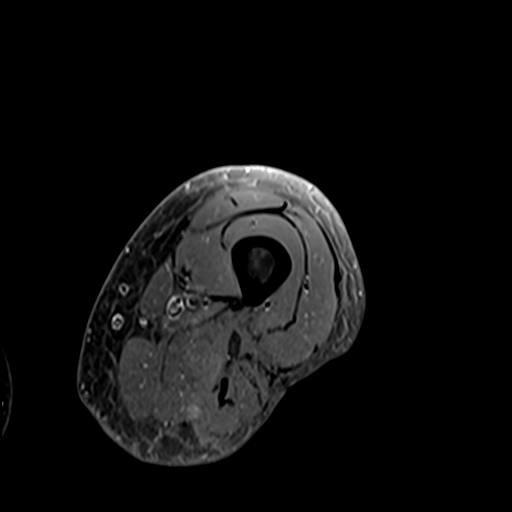
[im 29/48]
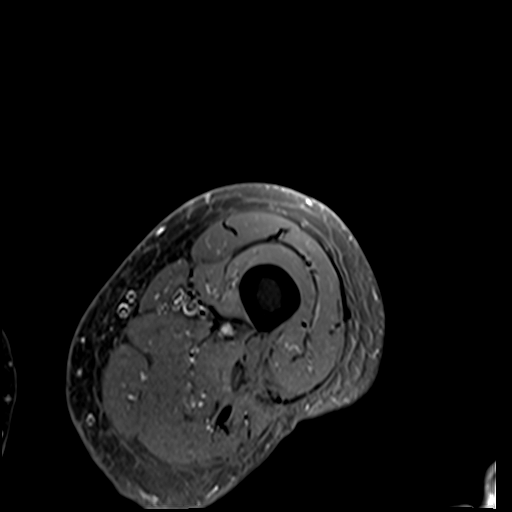
[im 38/48]
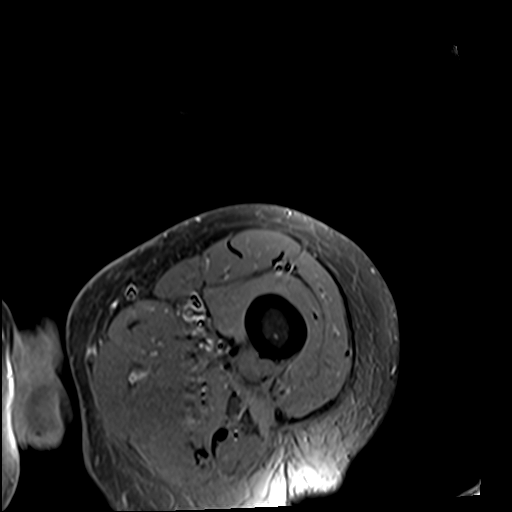
[im 48/48]
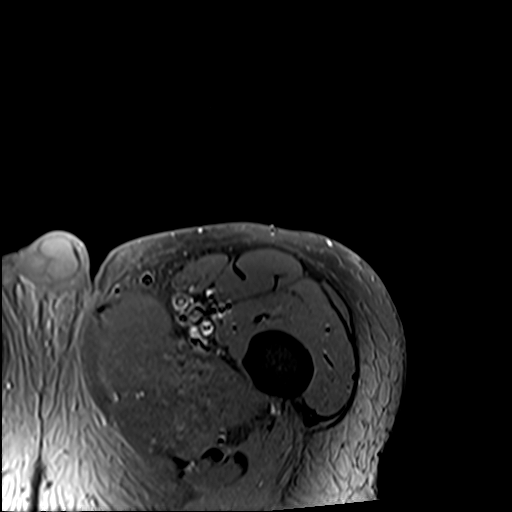

[Series 9: post axial ti · axial · left · 4.0mm · 0.51mm/px · z∈[-108,+77]mm · 5 of 48 slices shown]
[im 1/48]
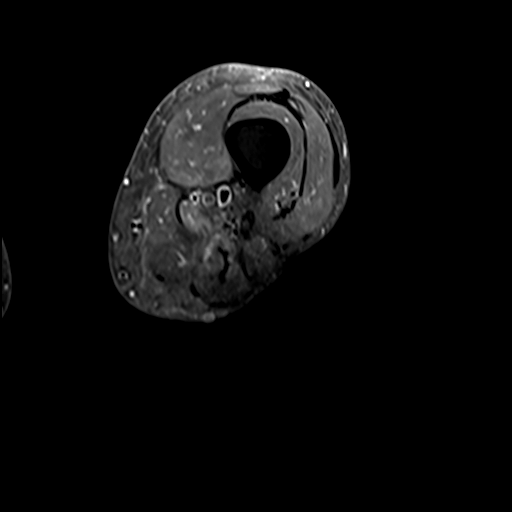
[im 10/48]
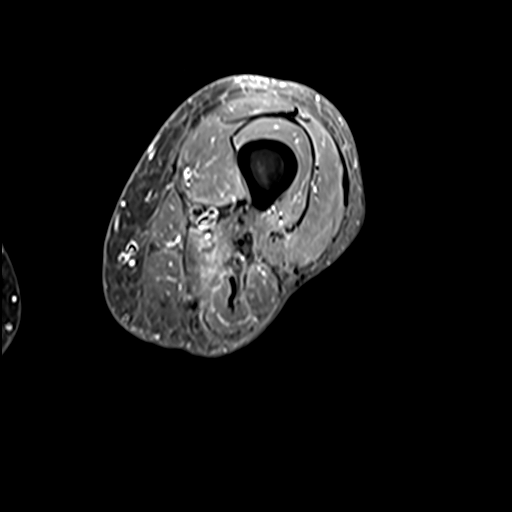
[im 19/48]
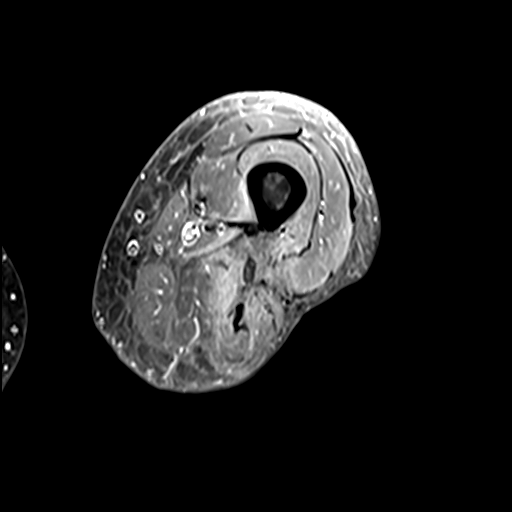
[im 29/48]
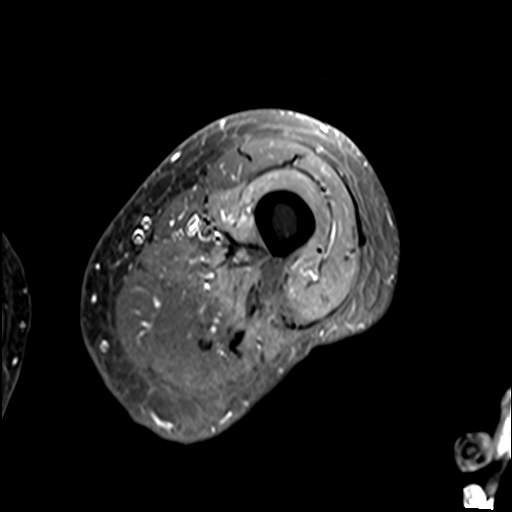
[im 38/48]
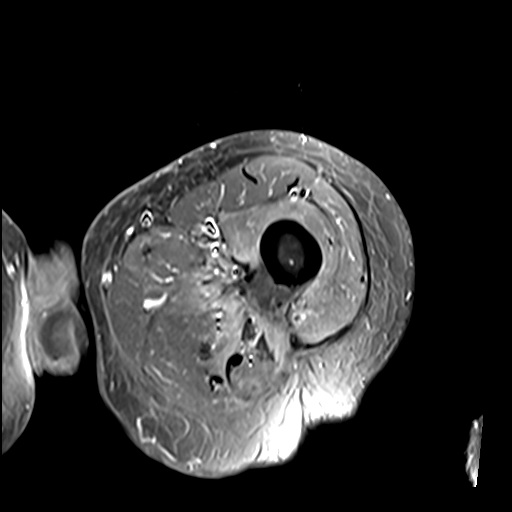

[35 of 40 positions shown; findings below may reference images not displayed]

FINDINGS: Bones/Joint/Cartilage

No evidence of fracture or osteonecrosis. Heterogeneous marrow
signal of the left femoral diaphysis, which may be secondary to
prior radiotherapy. No periosteal reaction.

Ligaments

Intact

Muscles and Tendons

Postsurgical changes with small fluid collection about the posterior
aspect of the mid femoral diaphysis. This fluid collection has
decreased in size now measuring approximately 2.0 x 3.0 x 5.7 cm
(previously measured 4.7 x 3.0 x 8.3 cm). There is no enhancement
about this collection on post-contrast sequences. Patchy muscle
edema in the posterior aspect of the thigh has also decreased since
prior examination.

Soft tissues

Postsurgical changes in posterior subcutaneous soft tissues. No
fluid collection or hematoma trauma in the subcutaneous soft
tissues. No abnormal enhancement.
IMPRESSION: 1. Interval decrease in size of the postsurgical seroma in the
surgical bed now measuring approximately 2.0 x 3.0 x 5.7 cm
(previously 4.7 x 3.0 x 8.3 cm).

2. No MR evidence of abnormal enhancement concerning for residual or
recurrent mass. Continued follow-up as clinically warranted.

3. Heterogeneous tear of the marrow of the left femoral diaphysis,
which may be secondary to postradiation changes. No acute osseous
abnormality.

## 2022-02-14 IMAGING — CT CT CHEST W/O CM
2 of 4 series · 15 of 36 positions shown, 18 images · non-contrast
Comparison: Chest CT [DATE]

CLINICAL DATA: Sarcoma, follow-up.



[Series 2: thorax · axial · 0.85mm/px · z∈[-114,+158]mm · 12 of 162 slices shown, 15 images]
[im 13/162  mediastinal]
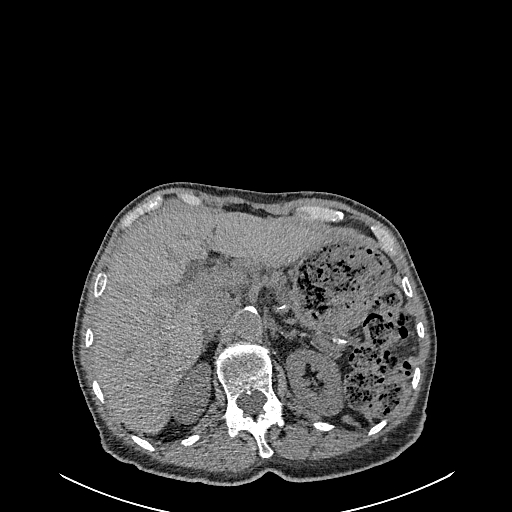
[im 13/162  lung]
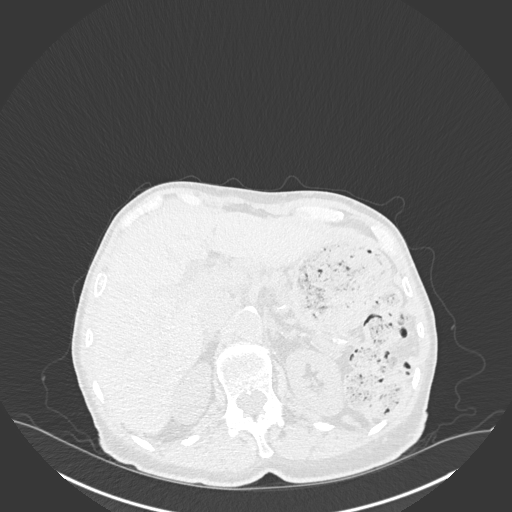
[im 25/162  lung]
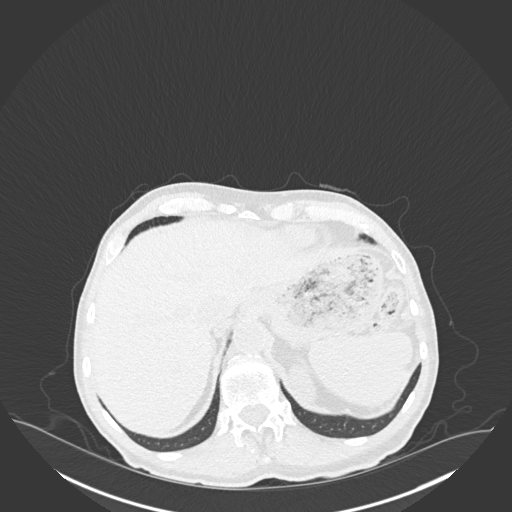
[im 38/162  lung]
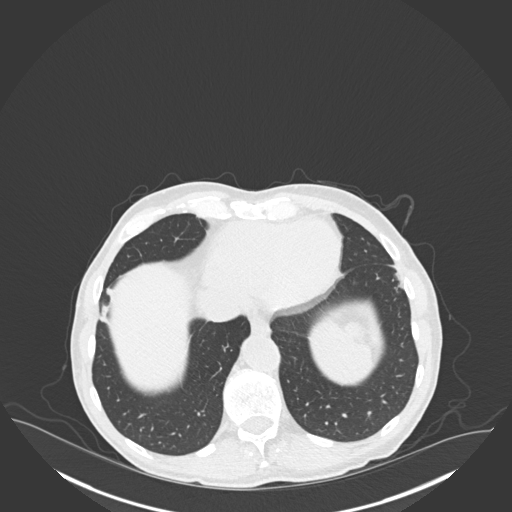
[im 50/162  lung]
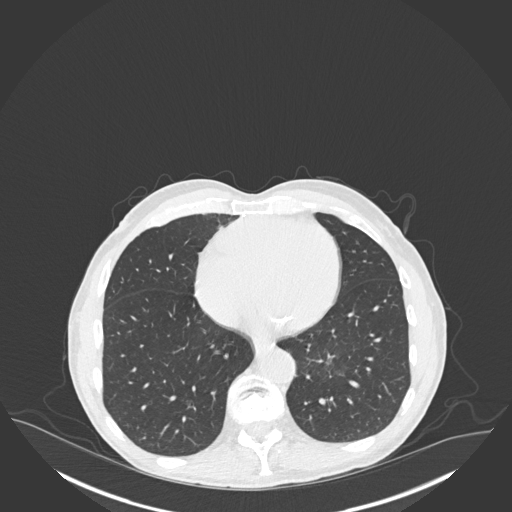
[im 62/162  mediastinal]
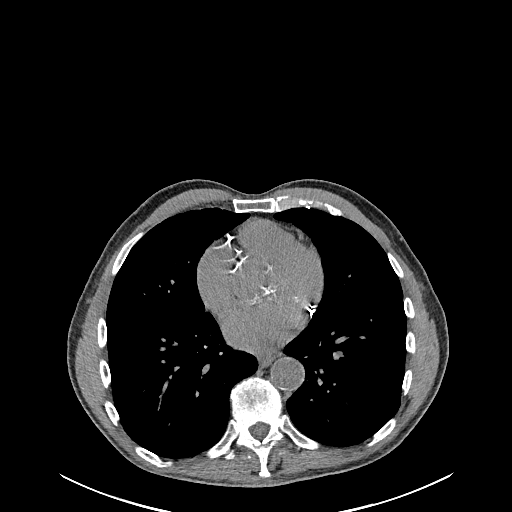
[im 62/162  lung]
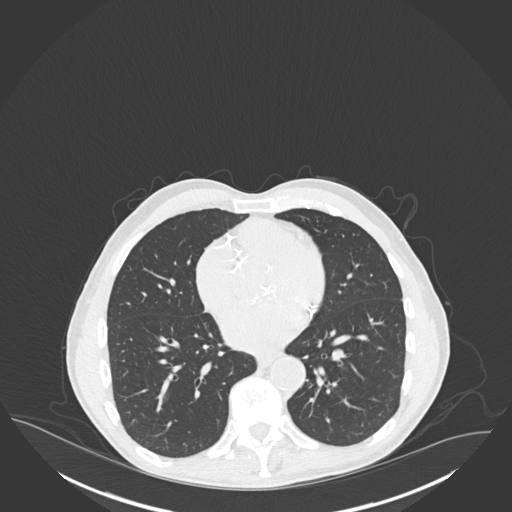
[im 75/162  lung]
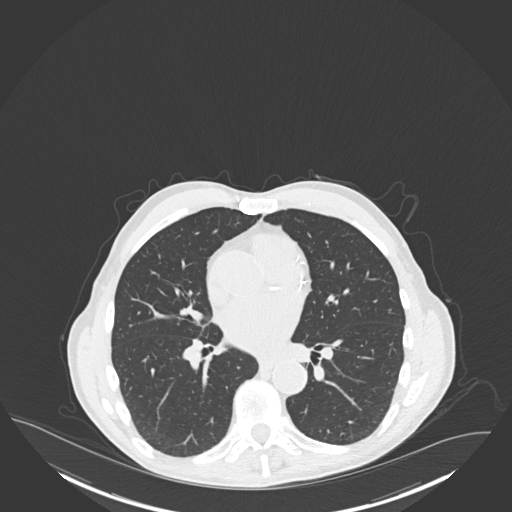
[im 87/162  lung]
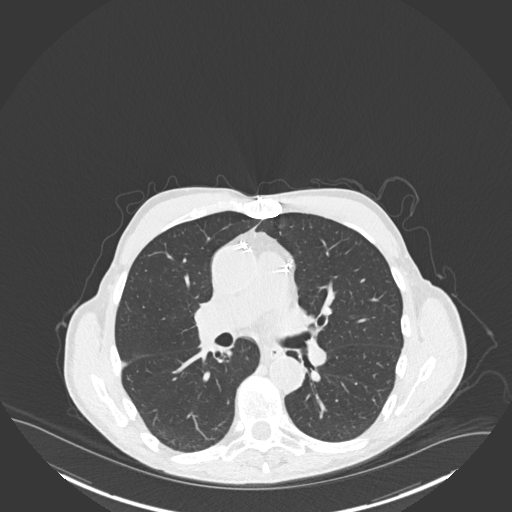
[im 100/162  lung]
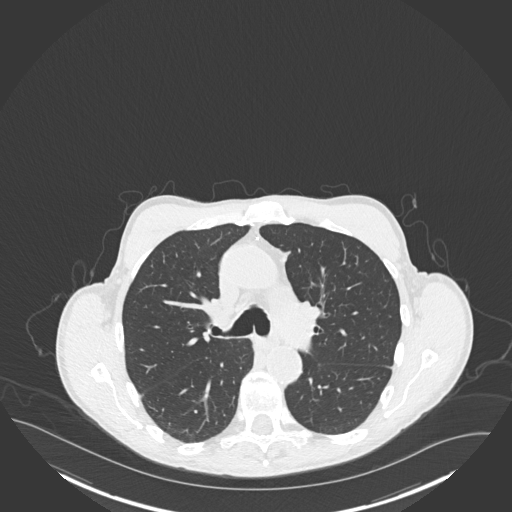
[im 112/162  mediastinal]
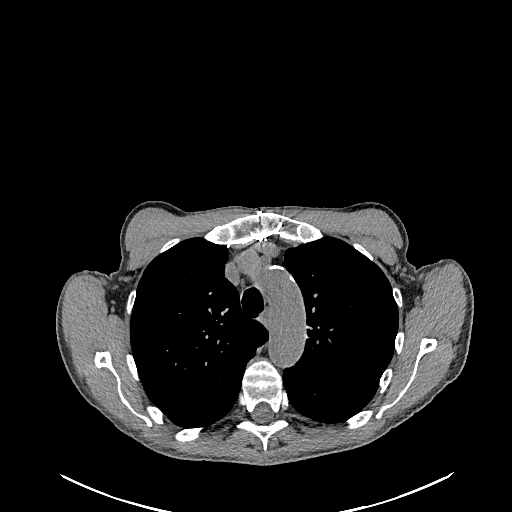
[im 112/162  lung]
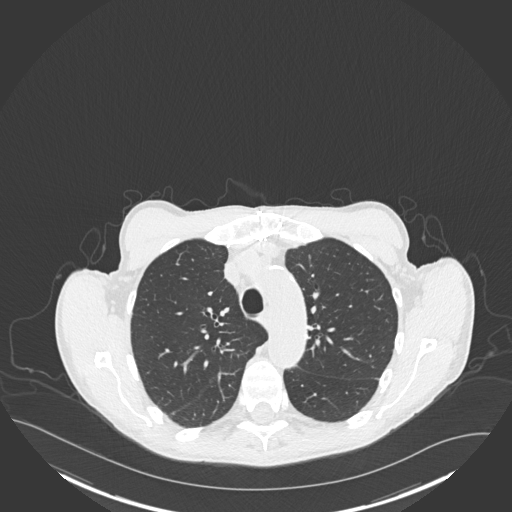
[im 124/162  lung]
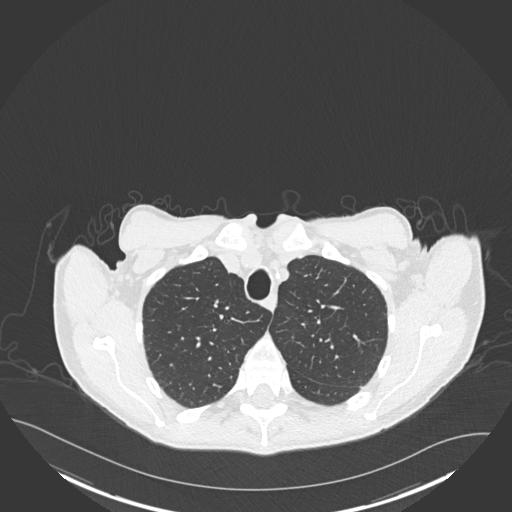
[im 137/162  lung]
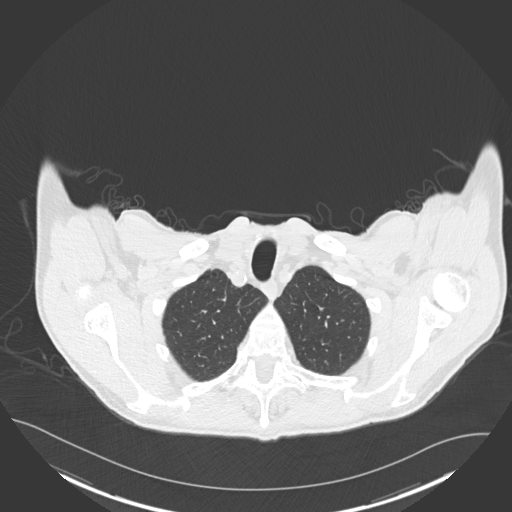
[im 149/162  lung]
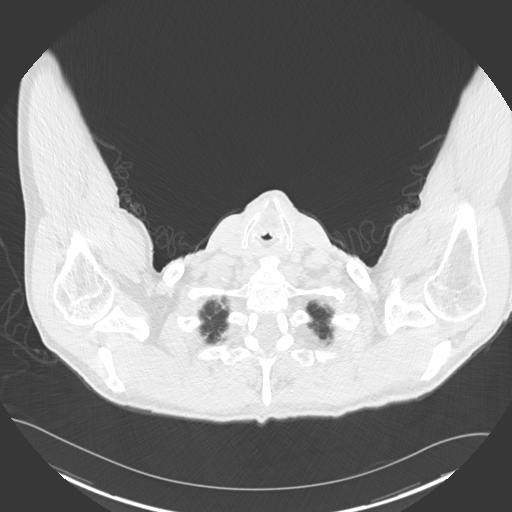

[Series 6: coronal · coronal · 0.65mm/px · 3 of 142 slices shown]
[im 29/142  lung]
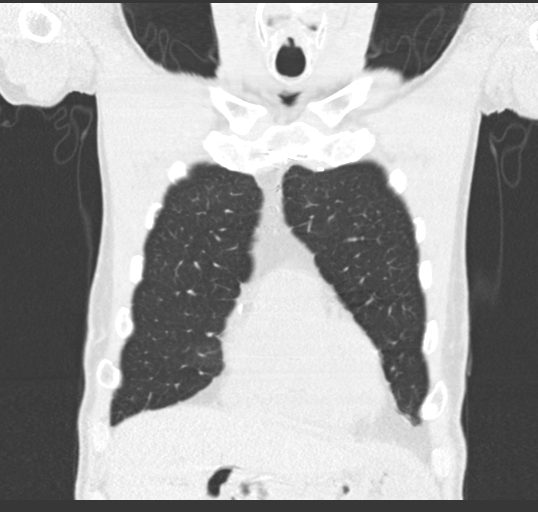
[im 57/142  lung]
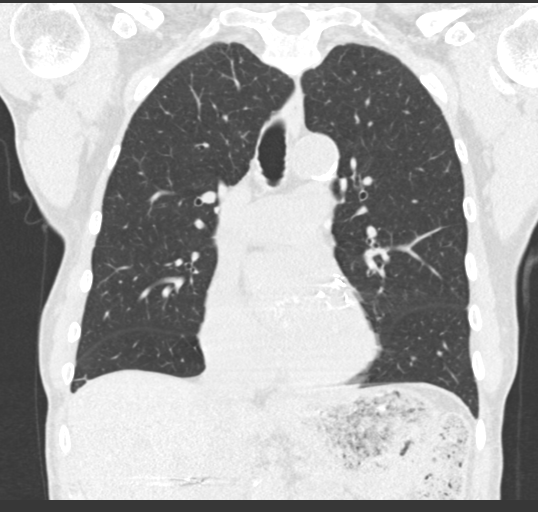
[im 85/142  lung]
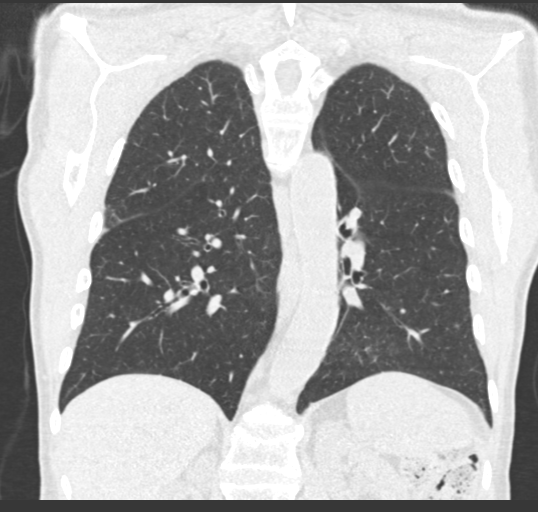

[15 of 36 positions shown; findings below may reference images not displayed]

FINDINGS: Cardiovascular: Aortic atherosclerosis. No aneurysm. Post CABG with
calcification of native coronary arteries. Mitral annulus
calcifications. The heart is normal in size. There is no pericardial
effusion.

Mediastinum/Nodes: No mediastinal adenopathy. No axillary
adenopathy. No obvious bulky hilar adenopathy on this unenhanced
exam. The esophagus is decompressed. Thyroid gland is diminutive.

Lungs/Pleura: Contiguous subpleural nodules in the periphery of the
anterior right lower lobe measuring up to 6 mm of adjacent linear
opacity and likely represent nodular scarring. Slight subpleural
nodular opacity in the anterior left lower lobe to a lesser extent,
series 5, image 128, also unchanged. No new or enlarging pulmonary
nodules. No acute airspace disease. No pleural fluid.

Upper Abdomen: No acute or unexpected findings. Stable linear
calcification at the splenic hilum. Cholecystectomy abdominal aortic
atherosclerosis.

Musculoskeletal: Prior median sternotomy. No focal bone lesions or
acute osseous findings. Stable bone island in the right humerus.
Prominent Schmorl's node with slight superior endplate compression
deformity of T12, chronic and unchanged. Exaggerated upper thoracic
kyphosis.
IMPRESSION: 1. Unchanged small subpleural nodules in both lower lobes, likely
nodular scarring. No evidence of metastatic disease in the thorax.
2. No acute intrathoracic abnormality.

Aortic Atherosclerosis ([UE]-[UE]).

## 2022-02-14 MED ORDER — GADOBUTROL 1 MMOL/ML IV SOLN
6.0000 mL | Freq: Once | INTRAVENOUS | Status: AC | PRN
Start: 2022-02-14 — End: 2022-02-14
  Administered 2022-02-14: 6 mL via INTRAVENOUS

## 2022-09-22 ENCOUNTER — Inpatient Hospital Stay: Payer: Medicare Other

## 2022-09-22 ENCOUNTER — Inpatient Hospital Stay: Payer: Medicare Other | Attending: Oncology | Admitting: Oncology

## 2022-09-22 VITALS — BP 138/56 | HR 60 | Temp 97.9°F | Resp 18 | Ht 72.0 in | Wt 168.4 lb

## 2022-09-22 DIAGNOSIS — C499 Malignant neoplasm of connective and soft tissue, unspecified: Secondary | ICD-10-CM | POA: Diagnosis not present

## 2022-09-22 DIAGNOSIS — D7589 Other specified diseases of blood and blood-forming organs: Secondary | ICD-10-CM

## 2022-09-22 DIAGNOSIS — D649 Anemia, unspecified: Secondary | ICD-10-CM | POA: Insufficient documentation

## 2022-09-22 LAB — CBC WITH DIFFERENTIAL (CANCER CENTER ONLY)
Abs Immature Granulocytes: 0.02 10*3/uL (ref 0.00–0.07)
Basophils Absolute: 0 10*3/uL (ref 0.0–0.1)
Basophils Relative: 1 %
Eosinophils Absolute: 0.1 10*3/uL (ref 0.0–0.5)
Eosinophils Relative: 3 %
HCT: 37.4 % — ABNORMAL LOW (ref 39.0–52.0)
Hemoglobin: 12.6 g/dL — ABNORMAL LOW (ref 13.0–17.0)
Immature Granulocytes: 1 %
Lymphocytes Relative: 35 %
Lymphs Abs: 1.5 10*3/uL (ref 0.7–4.0)
MCH: 35.7 pg — ABNORMAL HIGH (ref 26.0–34.0)
MCHC: 33.7 g/dL (ref 30.0–36.0)
MCV: 105.9 fL — ABNORMAL HIGH (ref 80.0–100.0)
Monocytes Absolute: 0.5 10*3/uL (ref 0.1–1.0)
Monocytes Relative: 11 %
Neutro Abs: 2.2 10*3/uL (ref 1.7–7.7)
Neutrophils Relative %: 49 %
Platelet Count: 167 10*3/uL (ref 150–400)
RBC: 3.53 MIL/uL — ABNORMAL LOW (ref 4.22–5.81)
RDW: 11.6 % (ref 11.5–15.5)
WBC Count: 4.3 10*3/uL (ref 4.0–10.5)
nRBC: 0 % (ref 0.0–0.2)

## 2022-09-22 NOTE — Progress Notes (Signed)
  [place] OFFICE PROGRESS NOTE   Diagnosis: Red cell macrocytosis, liposarcoma  INTERVAL HISTORY:   Joseph Werner returns as scheduled.  He feels well.  Good appetite.  Chronic back pain.  He continues to have limited range of motion of the left leg.  He has not followed up at [facility] in the past year.  Objective:  Vital signs in last 24 hours:  Blood pressure (!) 138/56, pulse 60, temperature 97.9 F (36.6 C), temperature source Oral, resp. rate 18, height 6' (1.829 m), weight 168 lb 6.4 oz (76.4 kg), SpO2 100 %.    Lymphatics: No cervical, supraclavicular, or inguinal nodes.  "Shotty "bilateral axillary nodes Resp: Lungs with coarse end inspiratory rhonchi at the right upper posterior chest, no respiratory distress Cardio: Regular rate and rhythm GI: No hepatosplenomegaly Vascular: No leg edema Musculoskeletal: No evidence for recurrent tumor at the right thigh   Lab Results:  Lab Results  Component Value Date   WBC 4.3 09/22/2022   HGB 12.6 (L) 09/22/2022   HCT 37.4 (L) 09/22/2022   MCV 105.9 (H) 09/22/2022   PLT 167 09/22/2022   NEUTROABS 2.2 09/22/2022    CMP  Lab Results  Component Value Date   NA 143 11/11/2012   K 4.4 11/11/2012   CL 108 11/11/2012   CO2 29 11/11/2012   GLUCOSE 95 11/11/2012   BUN 25 (H) 11/11/2012   CREATININE 1.11 11/11/2012   CALCIUM 9.6 11/11/2012   PROT 6.4 01/07/2014   ALBUMIN 3.9 01/07/2014   AST 29 01/07/2014   ALT 22 01/07/2014   ALKPHOS 26 (L) 01/07/2014   BILITOT 1.6 (H) 01/07/2014   GFRNONAA 63 (L) 11/11/2012   GFRAA 73 (L) 11/11/2012    No results found for: "CEA1", "CEA", "LTJ030", "CA125"  No results found for: "INR", "LABPROT"  Imaging:  No results found.  Medications: I have reviewed the patient's current medications.   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 A bone marrow biopsy in 2006 was nondiagnostic. 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 Chronic bradycardia. History of a Borderline low white count Right inguinal hernia repair 11/18/2012 Left thigh pleomorphic liposarcoma, grade 3 with necrosis, negative margins, status post neoadjuvant radiation 01/17/2021 - 02/21/2021 (50 Gray), followed by radical resection 04/11/2021, pT4 MRI left femur 02/14/2022-decrease in postsurgical seroma, no MRI evidence of abnormal enhancement CT chest 02/14/2022-unchanged small subpleural nodules in both lower lobes, no evidence of metastatic disease     Disposition: Joseph Werner has mild macrocytic anemia.  The Red cell macrocytosis is chronic.  I do not recommend further evaluation at present. He is in clinical remission from a liposarcoma.  I will refer him for surveillance imaging of the chest and left femur.  I recommend he schedule a follow-up with Dr. Sherlynn Stalls.  [name] will return for an office visit in 6 months.  Betsy Coder, MD  09/22/2022  10:57 AM

## 2022-09-27 ENCOUNTER — Encounter (HOSPITAL_COMMUNITY): Payer: Self-pay | Admitting: Orthopedic Surgery

## 2022-10-06 ENCOUNTER — Ambulatory Visit (HOSPITAL_BASED_OUTPATIENT_CLINIC_OR_DEPARTMENT_OTHER)
Admission: RE | Admit: 2022-10-06 | Discharge: 2022-10-06 | Disposition: A | Discharge status: Home / Self Care | Payer: Medicare Other | Source: Ambulatory Visit | Attending: Oncology | Admitting: Oncology

## 2022-10-06 DIAGNOSIS — K449 Diaphragmatic hernia without obstruction or gangrene: Secondary | ICD-10-CM | POA: Insufficient documentation

## 2022-10-06 DIAGNOSIS — I700 Atherosclerosis of aorta: Secondary | ICD-10-CM | POA: Diagnosis not present

## 2022-10-06 DIAGNOSIS — Z8589 Personal history of malignant neoplasm of other organs and systems: Secondary | ICD-10-CM | POA: Insufficient documentation

## 2022-10-06 DIAGNOSIS — Z923 Personal history of irradiation: Secondary | ICD-10-CM | POA: Diagnosis not present

## 2022-10-06 DIAGNOSIS — D7589 Other specified diseases of blood and blood-forming organs: Secondary | ICD-10-CM | POA: Diagnosis present

## 2022-10-06 DIAGNOSIS — Z8546 Personal history of malignant neoplasm of prostate: Secondary | ICD-10-CM | POA: Insufficient documentation

## 2022-10-06 DIAGNOSIS — R918 Other nonspecific abnormal finding of lung field: Secondary | ICD-10-CM | POA: Diagnosis not present

## 2022-10-06 DIAGNOSIS — I289 Disease of pulmonary vessels, unspecified: Secondary | ICD-10-CM | POA: Insufficient documentation

## 2022-10-10 ENCOUNTER — Telehealth: Payer: Self-pay

## 2022-10-10 NOTE — Telephone Encounter (Signed)
Patient gave verbal understanding and had no further questions or concerns  

## 2022-10-10 NOTE — Telephone Encounter (Signed)
-----   Message from Ladell Pier, MD sent at 10/08/2022  6:02 PM EST ----- Please call patient, CTs show no evidence of recurrent cancer, f/u as scheduled

## 2023-01-15 ENCOUNTER — Ambulatory Visit: Payer: Medicare Other | Attending: Cardiology | Admitting: Cardiology

## 2023-01-15 ENCOUNTER — Encounter: Payer: Self-pay | Admitting: Cardiology

## 2023-01-15 VITALS — BP 118/68 | HR 57 | Ht 72.0 in | Wt 168.0 lb

## 2023-01-15 DIAGNOSIS — Z951 Presence of aortocoronary bypass graft: Secondary | ICD-10-CM | POA: Diagnosis not present

## 2023-01-15 DIAGNOSIS — I10 Essential (primary) hypertension: Secondary | ICD-10-CM

## 2023-01-15 DIAGNOSIS — R001 Bradycardia, unspecified: Secondary | ICD-10-CM | POA: Diagnosis not present

## 2023-01-15 DIAGNOSIS — I2510 Atherosclerotic heart disease of native coronary artery without angina pectoris: Secondary | ICD-10-CM | POA: Diagnosis not present

## 2023-01-15 NOTE — Patient Instructions (Signed)
Medication Instructions:  The current medical regimen is effective;  continue present plan and medications.  *If you need a refill on your cardiac medications before your next appointment, please call your pharmacy*  Follow-Up: At [facility], you and your health needs are our priority.  As part of our continuing mission to provide you with exceptional heart care, we have created designated Provider Care Teams.  These Care Teams include your primary Cardiologist (physician) and Advanced Practice Providers (APPs -  Physician Assistants and Nurse Practitioners) who all work together to provide you with the care you need, when you need it.  We recommend signing up for the patient portal called "MyChart".  Sign up information is provided on this After Visit Summary.  MyChart is used to connect with patients for Virtual Visits (Telemedicine).  Patients are able to view lab/test results, encounter notes, upcoming appointments, etc.  Non-urgent messages can be sent to your provider as well.   To learn more about what you can do with MyChart, go to [email or web address].    Your next appointment:   1 year(s)  Provider:   Mark Skains, MD      

## 2023-01-15 NOTE — Progress Notes (Signed)
Cardiology Office Note:    Date:  01/15/2023   ID:  Joseph Werner, DOB [birth year], MRN [id number]  PCP:  Soundra Pilon, FNP   [facility] Providers Cardiologist:  Donato Schultz, MD     Referring MD: Soundra Pilon, FNP    History of Present Illness:    Joseph Werner is a 88 y.o. male here for the follow-up of coronary artery disease status post CABG in 1999, no symptoms prior to CABG.  Has had sarcoma in his left thigh, precipitated by severe cramping.  Has had radiation therapy.  Dr. Truett Perna following, notes reviewed.  Prior to his CABG he went to his primary care physician and had a stress test.  In the middle of the test it was stopped because of abnormality and Dr. Amil Amen performed catheterization and he went to CABG.  After that he used to have his yearly ETT but we discussed guidelines have changed.  Has a LIMA graft.  No vein grafts.  He has been working hard over the past 2 years with physical therapy exercises to help his strength in his left leg.  He has 103 degrees of flexion.  Still struggles with reaching his foot in the shower.  Past Medical History:  Diagnosis Date   Anemia    macrocytic anemia   Arthritis    Bradycardia    chronic   CAD (coronary artery disease)    Dyslipidemia    Eczema    Hyperlipidemia    Hypothyroid    Loss of weight    Macrocytosis    with normal bone marrow biopsy in 2006 may be benign varient, evaluated in the pastby hematologistDr. Truett Perna   Osteopenia    on bone density test from orthopedist Dr. Ethelene Hal, bone density test in July 2010 showed T score-1.1 in the lumbar spine and 2.1 in the femur. Bone density in August 2012 showed T score 2.3 in the femur and 2.2 in the forearm   Postsurgical aortocoronary bypass status    Prostate cancer [facility])    H/O   Prostate cancer [facility])    followed by urology Dr. Retta Diones    Past Surgical History:  Procedure Laterality Date   APPENDECTOMY     CHOLECYSTECTOMY  1982    CORONARY ARTERY BYPASS GRAFT  1998   HERNIA REPAIR     INGUINAL HERNIA REPAIR Right 11/18/2012   Procedure: HERNIA REPAIR INGUINAL ADULT;  Surgeon: Robyne Askew, MD;  Location: [facility] OR;  Service: General;  Laterality: Right;   INSERTION OF MESH Right 11/18/2012   Procedure: INSERTION OF MESH;  Surgeon: Robyne Askew, MD;  Location: [facility] OR;  Service: General;  Laterality: Right;   PROSTATECTOMY      Current Medications: Current Meds  Medication Sig   Ascorbic Acid (VITAMIN C) 1000 MG tablet Take 2,000 mg by mouth daily.   aspirin EC 81 MG tablet Take 81 mg by mouth daily.   atorvastatin (LIPITOR) 40 MG tablet TAKE 1 TABLET (40 MG TOTAL) BY MOUTH DAILY AT 6 PM.   Cholecalciferol 50 MCG (2000 UT) TABS Take 1 tablet by mouth daily.   Coenzyme Q10 (Q-SORB CO Q-10) 100 MG capsule Take 100 mg by mouth daily.   Coenzyme Q10 Liposomal 100 MG/ML LIQD Take 100 mg by mouth daily.   Flaxseed, Linseed, (FLAX SEED OIL PO) Take 1 capsule by mouth daily.   GLUCOSAMINE-CHONDROITIN PO Take 1 tablet by mouth 2 (two) times daily.  ibuprofen (ADVIL,MOTRIN) 200 MG tablet Take 400 mg by mouth every 6 (six) hours as needed for pain.   Selenium 200 MCG CAPS Take 200 mcg by mouth daily.   SYNTHROID 125 MCG tablet Take 125 mcg by mouth daily before breakfast.     Allergies:   Patient has no known allergies.   Social History   Socioeconomic History   Marital status: Married    Spouse name: Not on file   Number of children: Not on file   Years of education: Not on file   Highest education level: Not on file  Occupational History   Not on file  Tobacco Use   Smoking status: Never   Smokeless tobacco: Never  Substance and Sexual Activity   Alcohol use: Yes    Alcohol/week: 5.0 standard drinks of alcohol    Types: 5 Glasses of wine per week    Comment: wine with dinner   Drug use: No   Sexual activity: Not on file  Other Topics Concern   Not on file  Social History Narrative   Not on file    Social Determinants of Health   Financial Resource Strain: Not on file  Food Insecurity: Not on file  Transportation Needs: Not on file  Physical Activity: Not on file  Stress: Not on file  Social Connections: Not on file     Family History: The patient's family history includes Brain cancer in his brother; Breast cancer in his sister; Diabetes in his brother; Heart disease in his father; Hypertension in his father; Lung cancer in his mother; Prostate cancer in his father.  ROS:   Please see the history of present illness.     All other systems reviewed and are negative.  EKGs/Labs/Other Studies Reviewed:    The following studies were reviewed today:      EKG: 01/15/2023-sinus bradycardia 57 no other abnormalities.  Recent Labs: 09/22/2022: Hemoglobin 12.6; Platelet Count 167  Recent Lipid Panel    Component Value Date/Time   CHOL 135 01/07/2014 0735   TRIG 31.0 01/07/2014 0735   HDL 77.80 01/07/2014 0735   CHOLHDL 2 01/07/2014 0735   VLDL 6.2 01/07/2014 0735   LDLCALC 51 01/07/2014 0735     Risk Assessment/Calculations:               Physical Exam:    VS:  BP 118/68   Pulse (!) 57   Ht 6' (1.829 m)   Wt 168 lb (76.2 kg)   SpO2 96%   BMI 22.78 kg/m     Wt Readings from Last 3 Encounters:  01/15/23 168 lb (76.2 kg)  09/22/22 168 lb 6.4 oz (76.4 kg)  09/22/21 161 lb 3.2 oz (73.1 kg)     GEN:  Well nourished, well developed in no acute distress HEENT: Normal NECK: No JVD; No carotid bruits LYMPHATICS: No lymphadenopathy CARDIAC: RRR, no murmurs, rubs, gallops RESPIRATORY:  Clear to auscultation without rales, wheezing or rhonchi  ABDOMEN: Soft, non-tender, non-distended MUSCULOSKELETAL:  No edema; No deformity  SKIN: Warm and dry NEUROLOGIC:  Alert and oriented x 3 PSYCHIATRIC:  Normal affect   ASSESSMENT:    1. Atherosclerosis of native coronary artery of native heart without angina pectoris   2. Essential hypertension   3. Bradycardia    4. S/P CABG (coronary artery bypass graft)    PLAN:    In order of problems listed above:  CABG 1999, coronary artery disease no angina - Doing well.  Continue with goal-directed medical  therapy.  He has not had any beta-blocker because of bradycardia.  Bradycardia - No beta-blocker.  Previous heart rate 48 asymptomatic.  No indication for pacemaker.  LipoSarcoma of left thigh - 2022, resected at [facility].  Over 6 inches.  Left posterior thigh.  XRT.  Hyperlipidemia - On atorvastatin 40 mg last LDL 55.  Excellent.         Medication Adjustments/Labs and Tests Ordered: Current medicines are reviewed at length with the patient today.  Concerns regarding medicines are outlined above.  Orders Placed This Encounter  Procedures   EKG 12-Lead   No orders of the defined types were placed in this encounter.   Patient Instructions  Medication Instructions:  The current medical regimen is effective;  continue present plan and medications.  *If you need a refill on your cardiac medications before your next appointment, please call your pharmacy*  Follow-Up: At [facility], you and your health needs are our priority.  As part of our continuing mission to provide you with exceptional heart care, we have created designated Provider Care Teams.  These Care Teams include your primary Cardiologist (physician) and Advanced Practice Providers (APPs -  Physician Assistants and Nurse Practitioners) who all work together to provide you with the care you need, when you need it.  We recommend signing up for the patient portal called "MyChart".  Sign up information is provided on this After Visit Summary.  MyChart is used to connect with patients for Virtual Visits (Telemedicine).  Patients are able to view lab/test results, encounter notes, upcoming appointments, etc.  Non-urgent messages can be sent to your provider as well.   To learn more about what you can do with  MyChart, go to [email or web address].    Your next appointment:   1 year(s)  Provider:   Donato Schultz, MD        Signed, Donato Schultz, MD  01/15/2023 8:53 AM    [facility]

## 2023-03-21 ENCOUNTER — Other Ambulatory Visit (HOSPITAL_COMMUNITY): Payer: Self-pay | Admitting: Orthopedic Surgery

## 2023-03-21 DIAGNOSIS — C499 Malignant neoplasm of connective and soft tissue, unspecified: Secondary | ICD-10-CM

## 2023-03-22 ENCOUNTER — Ambulatory Visit (HOSPITAL_COMMUNITY)
Admission: RE | Admit: 2023-03-22 | Discharge: 2023-03-22 | Disposition: A | Discharge status: Home / Self Care | Payer: Medicare Other | Source: Ambulatory Visit | Attending: Orthopedic Surgery | Admitting: Orthopedic Surgery

## 2023-03-22 DIAGNOSIS — C499 Malignant neoplasm of connective and soft tissue, unspecified: Secondary | ICD-10-CM

## 2023-03-22 MED ORDER — GADOBUTROL 1 MMOL/ML IV SOLN
7.0000 mL | Freq: Once | INTRAVENOUS | Status: AC | PRN
  Administered 2023-03-22: 7 mL via INTRAVENOUS

## 2023-03-23 ENCOUNTER — Ambulatory Visit (HOSPITAL_COMMUNITY): Payer: Medicare Other

## 2023-03-23 ENCOUNTER — Inpatient Hospital Stay: Payer: Medicare Other | Attending: Oncology

## 2023-03-23 ENCOUNTER — Inpatient Hospital Stay: Payer: Medicare Other | Admitting: Oncology

## 2023-03-23 VITALS — BP 131/58 | HR 60 | Temp 98.2°F | Resp 18 | Ht 72.0 in | Wt 170.3 lb

## 2023-03-23 DIAGNOSIS — R001 Bradycardia, unspecified: Secondary | ICD-10-CM | POA: Insufficient documentation

## 2023-03-23 DIAGNOSIS — Z923 Personal history of irradiation: Secondary | ICD-10-CM | POA: Diagnosis not present

## 2023-03-23 DIAGNOSIS — D7589 Other specified diseases of blood and blood-forming organs: Secondary | ICD-10-CM

## 2023-03-23 DIAGNOSIS — M545 Low back pain, unspecified: Secondary | ICD-10-CM | POA: Diagnosis not present

## 2023-03-23 DIAGNOSIS — D649 Anemia, unspecified: Secondary | ICD-10-CM | POA: Insufficient documentation

## 2023-03-23 DIAGNOSIS — C499 Malignant neoplasm of connective and soft tissue, unspecified: Secondary | ICD-10-CM | POA: Insufficient documentation

## 2023-03-23 LAB — CBC WITH DIFFERENTIAL (CANCER CENTER ONLY)
Abs Immature Granulocytes: 0 10*3/uL (ref 0.00–0.07)
Basophils Absolute: 0 10*3/uL (ref 0.0–0.1)
Basophils Relative: 1 %
Eosinophils Absolute: 0.2 10*3/uL (ref 0.0–0.5)
Eosinophils Relative: 4 %
HCT: 37.2 % — ABNORMAL LOW (ref 39.0–52.0)
Hemoglobin: 12.7 g/dL — ABNORMAL LOW (ref 13.0–17.0)
Immature Granulocytes: 0 %
Lymphocytes Relative: 36 %
Lymphs Abs: 1.6 10*3/uL (ref 0.7–4.0)
MCH: 35.8 pg — ABNORMAL HIGH (ref 26.0–34.0)
MCHC: 34.1 g/dL (ref 30.0–36.0)
MCV: 104.8 fL — ABNORMAL HIGH (ref 80.0–100.0)
Monocytes Absolute: 0.5 10*3/uL (ref 0.1–1.0)
Monocytes Relative: 10 %
Neutro Abs: 2.2 10*3/uL (ref 1.7–7.7)
Neutrophils Relative %: 49 %
Platelet Count: 167 10*3/uL (ref 150–400)
RBC: 3.55 MIL/uL — ABNORMAL LOW (ref 4.22–5.81)
RDW: 11.5 % (ref 11.5–15.5)
WBC Count: 4.4 10*3/uL (ref 4.0–10.5)
nRBC: 0 % (ref 0.0–0.2)

## 2023-03-23 NOTE — Progress Notes (Signed)
  [facility] OFFICE PROGRESS NOTE   Diagnosis: Red cell macrocytosis, liposarcoma  INTERVAL HISTORY:   Joseph Werner returns as scheduled.  He generally feels well.  Good appetite and energy level.  He exercises 5 days/week in a gym.  He reports weakness of the left leg since undergoing surgery 2 years ago.  He has pain at the left lower back, worse when he lies on his side and when he gets up in the morning.  The pain improves during the day and not present with exercise. Objective:  Vital signs in last 24 hours:  Blood pressure (!) 131/58, pulse 60, temperature 98.2 F (36.8 C), temperature source Oral, resp. rate 18, height 6' (1.829 m), weight 170 lb 4.8 oz (77.2 kg), SpO2 100%.    Lymphatics: No cervical, supraclavicular, will nodes.  "Shotty "bilateral axillary nodes. Resp: Lungs clear bilaterally Cardio: Regular rate and rhythm GI: No hepatosplenomegaly, nontender Vascular: No leg edema Musculoskeletal: The area of discomfort is at the left posterior iliac, no mass or tenderness Skin: Left thigh surgical scar without evidence of recurrent tumor   Lab Results:  Lab Results  Component Value Date   WBC 4.4 03/23/2023   HGB 12.7 (L) 03/23/2023   HCT 37.2 (L) 03/23/2023   MCV 104.8 (H) 03/23/2023   PLT 167 03/23/2023   NEUTROABS 2.2 03/23/2023    CMP  Lab Results  Component Value Date   NA 143 11/11/2012   K 4.4 11/11/2012   CL 108 11/11/2012   CO2 29 11/11/2012   GLUCOSE 95 11/11/2012   BUN 25 (H) 11/11/2012   CREATININE 1.11 11/11/2012   CALCIUM 9.6 11/11/2012   PROT 6.4 01/07/2014   ALBUMIN 3.9 01/07/2014   AST 29 01/07/2014   ALT 22 01/07/2014   ALKPHOS 26 (L) 01/07/2014   BILITOT 1.6 (H) 01/07/2014   GFRNONAA 63 (L) 11/11/2012   GFRAA 73 (L) 11/11/2012      Medications: I have reviewed the patient's current medications.   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 A bone marrow biopsy  in 2006 was nondiagnostic. 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 Chronic bradycardia. History of a Borderline low white count Right inguinal hernia repair 11/18/2012 Left thigh pleomorphic liposarcoma, grade 3 with necrosis, negative margins, status post neoadjuvant radiation 01/17/2021 - 02/21/2021 (50 Gray), followed by radical resection 04/11/2021, pT4 MRI left femur 02/14/2022-decrease in postsurgical seroma, no MRI evidence of abnormal enhancement CT chest 02/14/2022-unchanged small subpleural nodules in both lower lobes, no evidence of metastatic disease     Disposition: Joseph Werner has stable Red cell macrocytosis and mild anemia.  He is in clinical remission from the liposarcoma.  He underwent restaging scans yesterday.  We will follow-up on the CT chest and MRI left femur results.  He is scheduled for follow-up at [facility] next week.  He will return for an office visit in 6 months.  I suspect the left lower back discomfort is related to a benign musculoskeletal condition, potentially due to anatomic changes from the left thigh resection.  I have a low suspicion for recurrence of the liposarcoma.  He will discuss the pain with physicians at [facility] next week.  Thornton Papas, MD  03/23/2023  11:01 AM

## 2023-09-24 ENCOUNTER — Inpatient Hospital Stay: Payer: Medicare Other | Attending: Oncology | Admitting: Oncology

## 2023-09-24 ENCOUNTER — Inpatient Hospital Stay: Payer: Medicare Other

## 2023-09-24 ENCOUNTER — Other Ambulatory Visit: Payer: Medicare Other

## 2023-09-24 VITALS — BP 135/58 | HR 60 | Temp 98.2°F | Resp 18 | Ht 72.0 in | Wt 168.8 lb

## 2023-09-24 DIAGNOSIS — C4922 Malignant neoplasm of connective and soft tissue of left lower limb, including hip: Secondary | ICD-10-CM

## 2023-09-24 DIAGNOSIS — D649 Anemia, unspecified: Secondary | ICD-10-CM

## 2023-09-24 DIAGNOSIS — Z923 Personal history of irradiation: Secondary | ICD-10-CM | POA: Diagnosis not present

## 2023-09-24 DIAGNOSIS — D7589 Other specified diseases of blood and blood-forming organs: Secondary | ICD-10-CM

## 2023-09-24 DIAGNOSIS — Z85831 Personal history of malignant neoplasm of soft tissue: Secondary | ICD-10-CM | POA: Insufficient documentation

## 2023-09-24 LAB — CBC WITH DIFFERENTIAL (CANCER CENTER ONLY)
Abs Immature Granulocytes: 0.03 10*3/uL (ref 0.00–0.07)
Basophils Absolute: 0 10*3/uL (ref 0.0–0.1)
Basophils Relative: 1 %
Eosinophils Absolute: 0.1 10*3/uL (ref 0.0–0.5)
Eosinophils Relative: 2 %
HCT: 29.3 % — ABNORMAL LOW (ref 39.0–52.0)
Hemoglobin: 9.8 g/dL — ABNORMAL LOW (ref 13.0–17.0)
Immature Granulocytes: 1 %
Lymphocytes Relative: 27 %
Lymphs Abs: 1.7 10*3/uL (ref 0.7–4.0)
MCH: 35.1 pg — ABNORMAL HIGH (ref 26.0–34.0)
MCHC: 33.4 g/dL (ref 30.0–36.0)
MCV: 105 fL — ABNORMAL HIGH (ref 80.0–100.0)
Monocytes Absolute: 0.5 10*3/uL (ref 0.1–1.0)
Monocytes Relative: 8 %
Neutro Abs: 4 10*3/uL (ref 1.7–7.7)
Neutrophils Relative %: 61 %
Platelet Count: 211 10*3/uL (ref 150–400)
RBC: 2.79 MIL/uL — ABNORMAL LOW (ref 4.22–5.81)
RDW: 12.1 % (ref 11.5–15.5)
WBC Count: 6.4 10*3/uL (ref 4.0–10.5)
nRBC: 0 % (ref 0.0–0.2)

## 2023-09-24 NOTE — Progress Notes (Signed)
  [facility] OFFICE PROGRESS NOTE   Diagnosis: Macrocytosis, liposarcoma  INTERVAL HISTORY:   Joseph Werner returns as scheduled.  No change at the left thigh.  He is scheduled for follow-up at [facility] this week.  He has not undergone surveillance imaging. He reports dark stool for the past several weeks.  He has mild abdominal discomfort for the past few days.  No bleeding.  He continues aspirin therapy.  He is not taking iron.  He notes increased fatigue when exercising.  No dysphagia.  Objective:  Vital signs in last 24 hours:  Blood pressure (!) 135/58, pulse 60, temperature 98.2 F (36.8 C), temperature source Temporal, resp. rate 18, height 6' (1.829 m), weight 168 lb 12.8 oz (76.6 kg), SpO2 100%.     Lymphatics: No cervical, supraclavicular, axillary, or inguinal nodes Resp: Lungs clear bilaterally Cardio: Regular rate and rhythm GI: No hepatosplenomegaly, no mass, nontender Rectal: Soft 2 cm raised linear area at the anterior lower rectum, brown Hemoccult positive stool Vascular: No leg edema  Skin: Left thigh scar without evidence of recurrent tumor  Lab Results:  Lab Results  Component Value Date   WBC 6.4 09/24/2023   HGB 9.8 (L) 09/24/2023   HCT 29.3 (L) 09/24/2023   MCV 105.0 (H) 09/24/2023   PLT 211 09/24/2023   NEUTROABS 4.0 09/24/2023    CMP  Lab Results  Component Value Date   NA 143 11/11/2012   K 4.4 11/11/2012   CL 108 11/11/2012   CO2 29 11/11/2012   GLUCOSE 95 11/11/2012   BUN 25 (H) 11/11/2012   CREATININE 1.11 11/11/2012   CALCIUM 9.6 11/11/2012   PROT 6.4 01/07/2014   ALBUMIN 3.9 01/07/2014   AST 29 01/07/2014   ALT 22 01/07/2014   ALKPHOS 26 (L) 01/07/2014   BILITOT 1.6 (H) 01/07/2014   GFRNONAA 63 (L) 11/11/2012   GFRAA 73 (L) 11/11/2012    Medications: I have reviewed the patient's current medications.   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 A bone marrow biopsy in  2006 was nondiagnostic. 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 Chronic bradycardia. History of a Borderline low white count Right inguinal hernia repair 11/18/2012 Left thigh pleomorphic liposarcoma, grade 3 with necrosis, negative margins, status post neoadjuvant radiation 01/17/2021 - 02/21/2021 (50 Gray), followed by radical resection 04/11/2021, pT4 MRI left femur 02/14/2022-decrease in postsurgical seroma, no MRI evidence of abnormal enhancement CT chest 02/14/2022-unchanged small subpleural nodules in both lower lobes, no evidence of metastatic disease 6.  Anemia with Hemoccult positive stool 09/24/2023      Disposition: Joseph Werner has chronic Red cell macrocytosis.  He has developed anemia since he was here in July.  He reports dark stool.  The stool is Hemoccult positive on exam today.  I suspect he has upper gastrointestinal bleeding.  I will contact Dr. Ewing Schlein request an urgent appointment.  The soft anterior rectal fullness is likely benign finding.  This could be assessed at the time of an endoscopic evaluation.  He will return for a lab visit in 1 week and an office visit in 4 weeks.  He will call for symptoms of anemia.  He is in clinical remission from the left thigh liposarcoma.  I ordered surveillance imaging studies.  These should be completed within the next few weeks.    Joseph Papas, MD  09/24/2023  12:37 PM

## 2023-09-25 ENCOUNTER — Encounter: Payer: Self-pay | Admitting: *Deleted

## 2023-09-25 NOTE — Progress Notes (Signed)
Faxed referral order, demographics and medial records to [facility] for appointment with Dr. Ewing Schlein.  Fax [phone]. Dr. Truett Perna spoke with Dr. Ewing Schlein, who will try to get him in sooner than the 2/14 appointment provided when this RN called office.

## 2023-09-27 ENCOUNTER — Other Ambulatory Visit: Payer: Self-pay | Admitting: Gastroenterology

## 2023-09-28 ENCOUNTER — Encounter (HOSPITAL_COMMUNITY): Payer: Self-pay | Admitting: Gastroenterology

## 2023-10-01 ENCOUNTER — Inpatient Hospital Stay: Payer: Medicare Other | Attending: Oncology

## 2023-10-01 DIAGNOSIS — D649 Anemia, unspecified: Secondary | ICD-10-CM | POA: Diagnosis present

## 2023-10-01 DIAGNOSIS — D7589 Other specified diseases of blood and blood-forming organs: Secondary | ICD-10-CM | POA: Diagnosis present

## 2023-10-01 DIAGNOSIS — Z85831 Personal history of malignant neoplasm of soft tissue: Secondary | ICD-10-CM | POA: Diagnosis present

## 2023-10-01 LAB — CBC WITH DIFFERENTIAL (CANCER CENTER ONLY)
Abs Immature Granulocytes: 0.01 10*3/uL (ref 0.00–0.07)
Basophils Absolute: 0 10*3/uL (ref 0.0–0.1)
Basophils Relative: 0 %
Eosinophils Absolute: 0.2 10*3/uL (ref 0.0–0.5)
Eosinophils Relative: 4 %
HCT: 32.7 % — ABNORMAL LOW (ref 39.0–52.0)
Hemoglobin: 10.6 g/dL — ABNORMAL LOW (ref 13.0–17.0)
Immature Granulocytes: 0 %
Lymphocytes Relative: 25 %
Lymphs Abs: 1.1 10*3/uL (ref 0.7–4.0)
MCH: 35 pg — ABNORMAL HIGH (ref 26.0–34.0)
MCHC: 32.4 g/dL (ref 30.0–36.0)
MCV: 107.9 fL — ABNORMAL HIGH (ref 80.0–100.0)
Monocytes Absolute: 0.5 10*3/uL (ref 0.1–1.0)
Monocytes Relative: 10 %
Neutro Abs: 2.7 10*3/uL (ref 1.7–7.7)
Neutrophils Relative %: 61 %
Platelet Count: 220 10*3/uL (ref 150–400)
RBC: 3.03 MIL/uL — ABNORMAL LOW (ref 4.22–5.81)
RDW: 13.4 % (ref 11.5–15.5)
WBC Count: 4.4 10*3/uL (ref 4.0–10.5)
nRBC: 0 % (ref 0.0–0.2)

## 2023-10-01 LAB — CMP (CANCER CENTER ONLY)
ALT: 22 U/L (ref 0–44)
AST: 22 U/L (ref 15–41)
Albumin: 4.1 g/dL (ref 3.5–5.0)
Alkaline Phosphatase: 47 U/L (ref 38–126)
Anion gap: 6 (ref 5–15)
BUN: 20 mg/dL (ref 8–23)
CO2: 29 mmol/L (ref 22–32)
Calcium: 9.3 mg/dL (ref 8.9–10.3)
Chloride: 105 mmol/L (ref 98–111)
Creatinine: 0.95 mg/dL (ref 0.61–1.24)
GFR, Estimated: >60 mL/min (ref >60)
Glucose, Bld: 92 mg/dL (ref 70–99)
Potassium: 4.7 mmol/L (ref 3.5–5.1)
Sodium: 140 mmol/L (ref 135–145)
Total Bilirubin: 0.7 mg/dL (ref 0.0–1.2)
Total Protein: 6.9 g/dL (ref 6.5–8.1)

## 2023-10-01 LAB — SAMPLE TO BLOOD BANK

## 2023-10-01 LAB — FERRITIN: Ferritin: 135 ng/mL (ref 24–336)

## 2023-10-02 ENCOUNTER — Encounter (HOSPITAL_COMMUNITY)
Admission: RE | Disposition: A | Discharge status: Home / Self Care | Payer: Self-pay | Source: Home / Self Care | Attending: Gastroenterology

## 2023-10-02 ENCOUNTER — Encounter: Payer: Self-pay | Admitting: *Deleted

## 2023-10-02 ENCOUNTER — Other Ambulatory Visit: Payer: Self-pay

## 2023-10-02 ENCOUNTER — Encounter (HOSPITAL_COMMUNITY): Payer: Self-pay | Admitting: Gastroenterology

## 2023-10-02 ENCOUNTER — Ambulatory Visit (HOSPITAL_COMMUNITY)
Admission: RE | Admit: 2023-10-02 | Discharge: 2023-10-02 | Disposition: A | Discharge status: Home / Self Care | Payer: Medicare Other | Attending: Gastroenterology | Admitting: Gastroenterology

## 2023-10-02 ENCOUNTER — Ambulatory Visit (HOSPITAL_COMMUNITY): Payer: Medicare Other | Admitting: Anesthesiology

## 2023-10-02 DIAGNOSIS — K2970 Gastritis, unspecified, without bleeding: Secondary | ICD-10-CM | POA: Diagnosis not present

## 2023-10-02 DIAGNOSIS — K449 Diaphragmatic hernia without obstruction or gangrene: Secondary | ICD-10-CM

## 2023-10-02 DIAGNOSIS — Z951 Presence of aortocoronary bypass graft: Secondary | ICD-10-CM | POA: Insufficient documentation

## 2023-10-02 DIAGNOSIS — E039 Hypothyroidism, unspecified: Secondary | ICD-10-CM | POA: Diagnosis not present

## 2023-10-02 DIAGNOSIS — I2510 Atherosclerotic heart disease of native coronary artery without angina pectoris: Secondary | ICD-10-CM | POA: Insufficient documentation

## 2023-10-02 DIAGNOSIS — Z7982 Long term (current) use of aspirin: Secondary | ICD-10-CM | POA: Insufficient documentation

## 2023-10-02 DIAGNOSIS — K921 Melena: Secondary | ICD-10-CM | POA: Insufficient documentation

## 2023-10-02 DIAGNOSIS — D500 Iron deficiency anemia secondary to blood loss (chronic): Secondary | ICD-10-CM | POA: Insufficient documentation

## 2023-10-02 DIAGNOSIS — K269 Duodenal ulcer, unspecified as acute or chronic, without hemorrhage or perforation: Secondary | ICD-10-CM | POA: Diagnosis not present

## 2023-10-02 DIAGNOSIS — K3189 Other diseases of stomach and duodenum: Secondary | ICD-10-CM | POA: Diagnosis not present

## 2023-10-02 DIAGNOSIS — D62 Acute posthemorrhagic anemia: Secondary | ICD-10-CM

## 2023-10-02 HISTORY — PX: BIOPSY: SHX5522

## 2023-10-02 HISTORY — PX: ESOPHAGOGASTRODUODENOSCOPY: SHX5428

## 2023-10-02 SURGERY — EGD (ESOPHAGOGASTRODUODENOSCOPY)
Anesthesia: Monitor Anesthesia Care

## 2023-10-02 MED ORDER — EPHEDRINE SULFATE-NACL 50-0.9 MG/10ML-% IV SOSY
PREFILLED_SYRINGE | INTRAVENOUS | Status: DC | PRN
  Administered 2023-10-02: 10 mg via INTRAVENOUS

## 2023-10-02 MED ORDER — GLYCOPYRROLATE PF 0.2 MG/ML IJ SOSY
PREFILLED_SYRINGE | INTRAMUSCULAR | Status: DC | PRN
  Administered 2023-10-02: .1 mg via INTRAVENOUS

## 2023-10-02 MED ORDER — PROPOFOL 10 MG/ML IV BOLUS
INTRAVENOUS | Status: AC
  Filled 2023-10-02: qty 20

## 2023-10-02 MED ORDER — SODIUM CHLORIDE 0.9 % IV SOLN: INTRAVENOUS | Status: DC | PRN

## 2023-10-02 MED ORDER — LIDOCAINE 2% (20 MG/ML) 5 ML SYRINGE
INTRAMUSCULAR | Status: DC | PRN
  Administered 2023-10-02: 50 mg via INTRAVENOUS

## 2023-10-02 MED ORDER — PROPOFOL 10 MG/ML IV BOLUS
INTRAVENOUS | Status: DC | PRN
  Administered 2023-10-02: 110 mg via INTRAVENOUS
  Administered 2023-10-02: 40 mg via INTRAVENOUS
  Administered 2023-10-02: 30 mg via INTRAVENOUS

## 2023-10-02 NOTE — Progress Notes (Signed)
 Joseph Werner Dotter 11:28 AM  Subjective: Patient doing well had any new problems since he was seen recently in the office and no more dark stools and is on a baby aspirin and has been taking his pump inhibitor and has no other complaints  Objective: Signs stable afebrile no acute distress exam please see preassessment evaluation BUN and creatinine okay hemoglobin increased from last week  Assessment: Seemingly resolved melena  Plan: Okay to proceed with endoscopy with anesthesia assistance with further workup and plans pending those findings  [facility]  office [phone] After 5PM or if no answer call [phone]

## 2023-10-02 NOTE — Discharge Instructions (Addendum)
 Hold aspirin for 10 days and minimize ibuprofen and Advil Aleve and other anti-inflammatories try to use Tylenol  only and continue stomach medicine long-term and begin with soft solids first meal today like breakfast food slowly advance diet and call if GI question or problem otherwise call for biopsy report in 1 week and follow-up if doing well in 2 months and will add abdominal pelvic CT to February 14 CT scheduled just to be sure no significant other lesions  YOU HAD AN ENDOSCOPIC PROCEDURE TODAY: Refer to the procedure report and other information in the discharge instructions given to you for any specific questions about what was found during the examination. If this information does not answer your questions, please call [place] office at [phone] to clarify.   YOU SHOULD EXPECT: Some feelings of bloating in the abdomen. Passage of more gas than usual. Walking can help get rid of the air that was put into your GI tract during the procedure and reduce the bloating. If you had a lower endoscopy (such as a colonoscopy or flexible sigmoidoscopy) you may notice spotting of blood in your stool or on the toilet paper. Some abdominal soreness may be present for a day or two, also.  DIET: Your first meal following the procedure should be a light meal and then it is ok to progress to your normal diet. A half-sandwich or bowl of soup is an example of a good first meal. Heavy or fried foods are harder to digest and may make you feel nauseous or bloated. Drink plenty of fluids but you should avoid alcoholic beverages for 24 hours. If you had a esophageal dilation, please see attached instructions for diet.    ACTIVITY: Your care partner should take you home directly after the procedure. You should plan to take it easy, moving slowly for the rest of the day. You can resume normal activity the day after the procedure however YOU SHOULD NOT DRIVE, use power tools, machinery or perform tasks that involve climbing  or major physical exertion for 24 hours (because of the sedation medicines used during the test).   SYMPTOMS TO REPORT IMMEDIATELY: A gastroenterologist can be reached at any hour. Please call [phone]  for any of the following symptoms:  Following lower endoscopy (colonoscopy, flexible sigmoidoscopy) Excessive amounts of blood in the stool  Significant tenderness, worsening of abdominal pains  Swelling of the abdomen that is new, acute  Fever of 100 or higher  Following upper endoscopy (EGD, EUS, ERCP, esophageal dilation) Vomiting of blood or coffee ground material  New, significant abdominal pain  New, significant chest pain or pain under the shoulder blades  Painful or persistently difficult swallowing  New shortness of breath  Black, tarry-looking or red, bloody stools  FOLLOW UP:  If any biopsies were taken you will be contacted by phone or by letter within the next 1-3 weeks. Call [phone]  if you have not heard about the biopsies in 3 weeks.  Please also call with any specific questions about appointments or follow up tests.

## 2023-10-02 NOTE — Op Note (Signed)
 [facility] Patient Name: Joseph Werner Procedure Date: 10/02/2023 MRN: [id number] Attending MD: Oliva Boots , MD, [id number] Date of Birth: May 18, 1935 CSN: [id number] Age: 89 Admit Type: Outpatient Procedure:                Upper GI endoscopy Indications:              Acute post hemorrhagic anemia, Melena Providers:                Oliva Boots, MD, Darice BIRCH. Bascom, RN, Corene Southgate,                            Technician Referring MD:              Medicines:                Monitored Anesthesia Care Complications:            No immediate complications. Estimated Blood Loss:     Estimated blood loss: none. Procedure:                Pre-Anesthesia Assessment:                           - Prior to the procedure, a History and Physical                            was performed, and patient medications and                            allergies were reviewed. The patient's tolerance of                            previous anesthesia was also reviewed. The risks                            and benefits of the procedure and the sedation                            options and risks were discussed with the patient.                            All questions were answered, and informed consent                            was obtained. Prior Anticoagulants: The patient has                            taken no anticoagulant or antiplatelet agents                            except for aspirin. ASA Grade Assessment: III - A                            patient with severe systemic disease. After  reviewing the risks and benefits, the patient was                            deemed in satisfactory condition to undergo the                            procedure.                           After obtaining informed consent, the endoscope was                            passed under direct vision. Throughout the                            procedure, the patient's blood pressure,  pulse, and                            oxygen saturations were monitored continuously. The                            [id number]-H190 ([id number]) Olympus endoscope was introduced                            through the mouth, and advanced to the third part                            of duodenum. The upper GI endoscopy was                            accomplished without difficulty. The patient                            tolerated the procedure well. Scope In: Scope Out: Findings:      A small hiatal hernia was present.      The entire examined stomach was normal. Biopsies were taken with a cold       forceps for histology of the antrum and fundus to rule out H. pylori.      One non-bleeding cratered duodenal ulcer with no stigmata of bleeding       was found in the duodenal bulb.      The second portion of the duodenum and third portion of the duodenum       were normal.      The exam was otherwise without abnormality. Impression:               - Small hiatal hernia.                           - Normal stomach. Biopsied.                           - Non-bleeding duodenal ulcer with no stigmata of                            bleeding.                           -  Normal second portion of the duodenum and third                            portion of the duodenum.                           - The examination was otherwise normal. Moderate Sedation:      Not Applicable - Patient had care per Anesthesia. Recommendation:           - Patient has a contact number available for                            emergencies. The signs and symptoms of potential                            delayed complications were discussed with the                            patient. Return to normal activities tomorrow.                            Written discharge instructions were provided to the                            patient.                           - Soft diet today.                           - Continue present  medications. Will hold aspirin                            for 10 days and continue your stomach medicine                            long-term                           - Await pathology results to determine if you need                            treatment for H. pylori.                           - Return to GI clinic in 2 months.                           - Telephone GI clinic for pathology results in 1                            week.                           - Telephone GI clinic if symptomatic PRN. Will go  ahead and add abdominal pelvic CT to CTs coming up                            just to make sure no other significant GI issue Procedure Code(s):        --- Professional ---                           [phone], Esophagogastroduodenoscopy, flexible,                            transoral; with biopsy, single or multiple Diagnosis Code(s):        --- Professional ---                           K44.9, Diaphragmatic hernia without obstruction or                            gangrene                           K26.9, Duodenal ulcer, unspecified as acute or                            chronic, without hemorrhage or perforation                           D62, Acute posthemorrhagic anemia                           K92.1, Melena (includes Hematochezia) CPT copyright 2022 American Medical Association. All rights reserved. The codes documented in this report are preliminary and upon coder review may  be revised to meet current compliance requirements. Oliva Boots, MD 10/02/2023 12:24:34 PM This report has been signed electronically. Number of Addenda: 0

## 2023-10-02 NOTE — Anesthesia Preprocedure Evaluation (Signed)
Anesthesia Evaluation  Patient identified by MRN, date of birth, ID band Patient awake    Reviewed: Allergy & Precautions, H&P , NPO status , Patient's Chart, lab work & pertinent test results  Airway Mallampati: II  TM Distance: >3 FB Neck ROM: Full    Dental no notable dental hx.    Pulmonary neg pulmonary ROS   Pulmonary exam normal breath sounds clear to auscultation       Cardiovascular + CAD and + CABG  Normal cardiovascular exam Rhythm:Regular Rate:Normal     Neuro/Psych negative neurological ROS  negative psych ROS   GI/Hepatic Neg liver ROS,,,melena   Endo/Other  Hypothyroidism    Renal/GU negative Renal ROS  negative genitourinary   Musculoskeletal  (+) Arthritis ,    Abdominal   Peds negative pediatric ROS (+)  Hematology  (+) Blood dyscrasia, anemia   Anesthesia Other Findings   Reproductive/Obstetrics negative OB ROS                              Anesthesia Physical Anesthesia Plan  ASA: 3  Anesthesia Plan: MAC   Post-op Pain Management:    Induction: Intravenous  PONV Risk Score and Plan: 1 and Propofol infusion and Treatment may vary due to age or medical condition  Airway Management Planned: Natural Airway  Additional Equipment:   Intra-op Plan:   Post-operative Plan:   Informed Consent: I have reviewed the patients History and Physical, chart, labs and discussed the procedure including the risks, benefits and alternatives for the proposed anesthesia with the patient or authorized representative who has indicated his/her understanding and acceptance.     Dental advisory given  Plan Discussed with: CRNA  Anesthesia Plan Comments:          Anesthesia Quick Evaluation

## 2023-10-02 NOTE — Anesthesia Postprocedure Evaluation (Signed)
 Anesthesia Post Note  Patient: Joseph Werner  Procedure(s) Performed: ESOPHAGOGASTRODUODENOSCOPY (EGD) BIOPSY     Patient location during evaluation: PACU Anesthesia Type: MAC Level of consciousness: awake and alert Pain management: pain level controlled Vital Signs Assessment: post-procedure vital signs reviewed and stable Respiratory status: spontaneous breathing, nonlabored ventilation, respiratory function stable and patient connected to nasal cannula oxygen Cardiovascular status: stable and blood pressure returned to baseline Postop Assessment: no apparent nausea or vomiting Anesthetic complications: no   No notable events documented.  Last Vitals:  Vitals:   10/02/23 1256 10/02/23 1257  BP:  (!) 118/43  Pulse: (!) 51 (!) 54  Resp: (!) 23 15  Temp:    SpO2: 100% 96%    Last Pain:  Vitals:   10/02/23 1257  TempSrc:   PainSc: 0-No pain                 Thom JONELLE Peoples

## 2023-10-02 NOTE — Transfer of Care (Signed)
 Immediate Anesthesia Transfer of Care Note  Patient: Joseph Werner  Procedure(s) Performed: ESOPHAGOGASTRODUODENOSCOPY (EGD) BIOPSY  Patient Location: Endoscopy Unit  Anesthesia Type:MAC  Level of Consciousness: awake, alert , oriented, and patient cooperative  Airway & Oxygen Therapy: Patient Spontanous Breathing and Patient connected to face mask oxygen  Post-op Assessment: Report given to RN and Post -op Vital signs reviewed and stable  Post vital signs: Reviewed and stable  Last Vitals:  Vitals Value Taken Time  BP 102/35 10/02/23 1227  Temp    Pulse 57 10/02/23 1228  Resp 12 10/02/23 1228  SpO2 99 % 10/02/23 1228  Vitals shown include unfiled device data.  Last Pain:  Vitals:   10/02/23 1117  TempSrc: Temporal  PainSc: 0-No pain         Complications: No notable events documented.

## 2023-10-03 LAB — SURGICAL PATHOLOGY

## 2023-10-10 ENCOUNTER — Other Ambulatory Visit (HOSPITAL_BASED_OUTPATIENT_CLINIC_OR_DEPARTMENT_OTHER): Payer: Self-pay | Admitting: Gastroenterology

## 2023-10-10 DIAGNOSIS — K921 Melena: Secondary | ICD-10-CM

## 2023-10-10 DIAGNOSIS — D649 Anemia, unspecified: Secondary | ICD-10-CM

## 2023-10-12 ENCOUNTER — Ambulatory Visit (HOSPITAL_BASED_OUTPATIENT_CLINIC_OR_DEPARTMENT_OTHER)
Admission: RE | Admit: 2023-10-12 | Discharge: 2023-10-12 | Disposition: A | Discharge status: Home / Self Care | Payer: Medicare Other | Source: Ambulatory Visit | Attending: Oncology | Admitting: Oncology

## 2023-10-12 ENCOUNTER — Ambulatory Visit (HOSPITAL_BASED_OUTPATIENT_CLINIC_OR_DEPARTMENT_OTHER)
Admission: RE | Admit: 2023-10-12 | Discharge: 2023-10-12 | Disposition: A | Discharge status: Home / Self Care | Payer: Medicare Other | Source: Ambulatory Visit | Attending: Gastroenterology | Admitting: Gastroenterology

## 2023-10-12 DIAGNOSIS — K921 Melena: Secondary | ICD-10-CM | POA: Insufficient documentation

## 2023-10-12 DIAGNOSIS — D649 Anemia, unspecified: Secondary | ICD-10-CM | POA: Insufficient documentation

## 2023-10-12 DIAGNOSIS — C4922 Malignant neoplasm of connective and soft tissue of left lower limb, including hip: Secondary | ICD-10-CM

## 2023-10-12 MED ORDER — GADOBUTROL 1 MMOL/ML IV SOLN
7.5000 mL | Freq: Once | INTRAVENOUS | Status: AC | PRN
  Administered 2023-10-12: 7.5 mL via INTRAVENOUS
  Filled 2023-10-12: qty 7.5

## 2023-10-12 MED ORDER — IOHEXOL 300 MG/ML  SOLN
100.0000 mL | Freq: Once | INTRAMUSCULAR | Status: AC | PRN
  Administered 2023-10-12: 100 mL via INTRAVENOUS

## 2023-10-25 ENCOUNTER — Inpatient Hospital Stay: Payer: Medicare Other | Admitting: Oncology

## 2023-10-25 ENCOUNTER — Inpatient Hospital Stay: Payer: Medicare Other

## 2023-10-25 VITALS — BP 124/64 | HR 60 | Temp 98.2°F | Resp 20 | Ht 72.0 in | Wt 167.4 lb

## 2023-10-25 DIAGNOSIS — D649 Anemia, unspecified: Secondary | ICD-10-CM | POA: Diagnosis not present

## 2023-10-25 LAB — CBC WITH DIFFERENTIAL (CANCER CENTER ONLY)
Abs Immature Granulocytes: 0.01 10*3/uL (ref 0.00–0.07)
Basophils Absolute: 0 10*3/uL (ref 0.0–0.1)
Basophils Relative: 0 %
Eosinophils Absolute: 0.2 10*3/uL (ref 0.0–0.5)
Eosinophils Relative: 3 %
HCT: 33.3 % — ABNORMAL LOW (ref 39.0–52.0)
Hemoglobin: 11.2 g/dL — ABNORMAL LOW (ref 13.0–17.0)
Immature Granulocytes: 0 %
Lymphocytes Relative: 24 %
Lymphs Abs: 1.1 10*3/uL (ref 0.7–4.0)
MCH: 35.4 pg — ABNORMAL HIGH (ref 26.0–34.0)
MCHC: 33.6 g/dL (ref 30.0–36.0)
MCV: 105.4 fL — ABNORMAL HIGH (ref 80.0–100.0)
Monocytes Absolute: 0.4 10*3/uL (ref 0.1–1.0)
Monocytes Relative: 9 %
Neutro Abs: 2.9 10*3/uL (ref 1.7–7.7)
Neutrophils Relative %: 64 %
Platelet Count: 168 10*3/uL (ref 150–400)
RBC: 3.16 MIL/uL — ABNORMAL LOW (ref 4.22–5.81)
RDW: 11.6 % (ref 11.5–15.5)
WBC Count: 4.5 10*3/uL (ref 4.0–10.5)
nRBC: 0 % (ref 0.0–0.2)

## 2023-10-25 LAB — SAMPLE TO BLOOD BANK

## 2023-10-25 NOTE — Progress Notes (Signed)
  [facility] OFFICE PROGRESS NOTE   Diagnosis: Anemia, liposarcoma  INTERVAL HISTORY:   Mr. Doughten returns as scheduled.  No complaint.  No bleeding.  He underwent endoscopy Dr. Ewing Schlein this showed a nonbleeding duodenal ulcer.  He is taking omeprazole.  He is scheduled to see Dr. Ewing Schlein in 2 months.  Objective:  Vital signs in last 24 hours:  Blood pressure 124/64, pulse 60, temperature 98.2 F (36.8 C), temperature source Temporal, resp. rate 20, height 6' (1.829 m), weight 167 lb 6.4 oz (75.9 kg), SpO2 100%.    Resp: Lungs clear bilaterally Cardio: Regular rate and rhythm GI: No hepatosplenomegaly, no mass, reducible ventral hernia Vascular: Trace ankle edema bilaterally, left lower leg is slightly larger than the right side    Lab Results:  Lab Results  Component Value Date   WBC 4.5 10/25/2023   HGB 11.2 (L) 10/25/2023   HCT 33.3 (L) 10/25/2023   MCV 105.4 (H) 10/25/2023   PLT 168 10/25/2023   NEUTROABS 2.9 10/25/2023    CMP  Lab Results  Component Value Date   NA 140 10/01/2023   K 4.7 10/01/2023   CL 105 10/01/2023   CO2 29 10/01/2023   GLUCOSE 92 10/01/2023   BUN 20 10/01/2023   CREATININE 0.95 10/01/2023   CALCIUM 9.3 10/01/2023   PROT 6.9 10/01/2023   ALBUMIN 4.1 10/01/2023   AST 22 10/01/2023   ALT 22 10/01/2023   ALKPHOS 47 10/01/2023   BILITOT 0.7 10/01/2023   GFRNONAA >60 10/01/2023   GFRAA 73 (L) 11/11/2012    No results found for: "CEA1", "CEA", "GEX528", "CA125"  No results found for: "INR", "LABPROT"  Imaging:  No results found.  Medications: I have reviewed the patient's current medications.   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 A bone marrow biopsy in 2006 was nondiagnostic. 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 Chronic bradycardia. History of a Borderline low white count Right inguinal hernia repair 11/18/2012 Left thigh  pleomorphic liposarcoma, grade 3 with necrosis, negative margins, status post neoadjuvant radiation 01/17/2021 - 02/21/2021 (50 Gray), followed by radical resection 04/11/2021, pT4 MRI left femur 02/14/2022-decrease in postsurgical seroma, no MRI evidence of abnormal enhancement CT chest 02/14/2022-unchanged small subpleural nodules in both lower lobes, no evidence of metastatic disease MRI femur 10/12/2023: No evidence of residual or recurrent malignancy, unchanged fluid collection 6.  Anemia with Hemoccult positive stool 09/24/2023 Upper endoscopy 10/02/2023: Duodenal ulcer with no stigmata of bleeding, gastric biopsy negative for H. pylori      Disposition: Mr. Mcadams appears stable.  The hemoglobin is higher compared to when he was here last month.  It is possible the Hemoccult positive stool was related to the duodenal ulcer.  He will follow-up with Dr. Ewing Schlein to consider the indication for additional GI evaluation. He underwent CTs of the chest and abdomen/pelvis 10/12/2023.  The studies have not been read.  We will request a stat reading.  He will return for an office and lab visit in 3 months.  Thornton Papas, MD  10/25/2023  9:59 AM

## 2023-10-30 ENCOUNTER — Telehealth: Payer: Self-pay | Admitting: *Deleted

## 2023-10-30 NOTE — Telephone Encounter (Signed)
 Notified of chest CT negative for cancer. F/U as scheduled

## 2023-10-30 NOTE — Telephone Encounter (Signed)
-----   Message from Thornton Papas sent at 10/29/2023  3:51 PM EST ----- Please call patient chest CT is negative for metastatic disease, follow-up as scheduled

## 2024-01-22 ENCOUNTER — Telehealth: Payer: Self-pay | Admitting: Oncology

## 2024-01-22 ENCOUNTER — Inpatient Hospital Stay: Payer: Medicare Other | Admitting: Oncology

## 2024-01-22 ENCOUNTER — Inpatient Hospital Stay: Payer: Medicare Other | Attending: Oncology

## 2024-01-22 VITALS — BP 121/48 | HR 60 | Temp 98.1°F | Resp 18 | Ht 72.0 in | Wt 162.5 lb

## 2024-01-22 DIAGNOSIS — Z85831 Personal history of malignant neoplasm of soft tissue: Secondary | ICD-10-CM | POA: Insufficient documentation

## 2024-01-22 DIAGNOSIS — D7589 Other specified diseases of blood and blood-forming organs: Secondary | ICD-10-CM | POA: Insufficient documentation

## 2024-01-22 DIAGNOSIS — D649 Anemia, unspecified: Secondary | ICD-10-CM | POA: Insufficient documentation

## 2024-01-22 LAB — CBC WITH DIFFERENTIAL (CANCER CENTER ONLY)
Abs Immature Granulocytes: 0.01 10*3/uL (ref 0.00–0.07)
Basophils Absolute: 0 10*3/uL (ref 0.0–0.1)
Basophils Relative: 1 %
Eosinophils Absolute: 0.1 10*3/uL (ref 0.0–0.5)
Eosinophils Relative: 3 %
HCT: 35.9 % — ABNORMAL LOW (ref 39.0–52.0)
Hemoglobin: 12.1 g/dL — ABNORMAL LOW (ref 13.0–17.0)
Immature Granulocytes: 0 %
Lymphocytes Relative: 26 %
Lymphs Abs: 1.1 10*3/uL (ref 0.7–4.0)
MCH: 35 pg — ABNORMAL HIGH (ref 26.0–34.0)
MCHC: 33.7 g/dL (ref 30.0–36.0)
MCV: 103.8 fL — ABNORMAL HIGH (ref 80.0–100.0)
Monocytes Absolute: 0.4 10*3/uL (ref 0.1–1.0)
Monocytes Relative: 10 %
Neutro Abs: 2.4 10*3/uL (ref 1.7–7.7)
Neutrophils Relative %: 60 %
Platelet Count: 169 10*3/uL (ref 150–400)
RBC: 3.46 MIL/uL — ABNORMAL LOW (ref 4.22–5.81)
RDW: 11.8 % (ref 11.5–15.5)
WBC Count: 4 10*3/uL (ref 4.0–10.5)
nRBC: 0 % (ref 0.0–0.2)

## 2024-01-22 LAB — FERRITIN: Ferritin: 141 ng/mL (ref 24–336)

## 2024-01-22 NOTE — Progress Notes (Signed)
  [facility] OFFICE PROGRESS NOTE   Diagnosis: Anemia, liposarcoma  INTERVAL HISTORY:   [name] returns as scheduled.  He feels well.  No palpable change at the left thigh.  No bleeding.  He is no longer taking iron.  He exercises.  Objective:  Vital signs in last 24 hours:  Blood pressure (!) 121/48, pulse 60, temperature 98.1 F (36.7 C), temperature source Temporal, resp. rate 18, height 6' (1.829 m), weight 162 lb 8 oz (73.7 kg), SpO2 98%.   Lymphatics: No cervical, supraclavicular, axillary, or inguinal nodes Resp: Clear bilaterally Cardio: Regular rate and rhythm GI: No hepatosplenomegaly, no mass, reducible upper abdominal incisional hernia with slight associated tenderness Vascular: No leg edema  Skin: Left thigh scar without evidence of recurrent tumor Musculoskeletal: No mass at the left thigh  Lab Results:  Lab Results  Component Value Date   WBC 4.0 01/22/2024   HGB 12.1 (L) 01/22/2024   HCT 35.9 (L) 01/22/2024   MCV 103.8 (H) 01/22/2024   PLT 169 01/22/2024   NEUTROABS 2.4 01/22/2024    CMP  Lab Results  Component Value Date   NA 140 10/01/2023   K 4.7 10/01/2023   CL 105 10/01/2023   CO2 29 10/01/2023   GLUCOSE 92 10/01/2023   BUN 20 10/01/2023   CREATININE 0.95 10/01/2023   CALCIUM  9.3 10/01/2023   PROT 6.9 10/01/2023   ALBUMIN 4.1 10/01/2023   AST 22 10/01/2023   ALT 22 10/01/2023   ALKPHOS 47 10/01/2023   BILITOT 0.7 10/01/2023   GFRNONAA >60 10/01/2023   GFRAA 73 (L) 11/11/2012   Medications: I have reviewed the patient's current medications.   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 A bone marrow biopsy in 2006 was nondiagnostic. 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 Chronic bradycardia. History of a Borderline low white count Right inguinal hernia repair 11/18/2012 Left thigh pleomorphic liposarcoma, grade 3 with necrosis, negative  margins, status post neoadjuvant radiation 01/17/2021 - 02/21/2021 (50 Gray), followed by radical resection 04/11/2021, pT4 MRI left femur 02/14/2022-decrease in postsurgical seroma, no MRI evidence of abnormal enhancement CT chest 02/14/2022-unchanged small subpleural nodules in both lower lobes, no evidence of metastatic disease MRI femur 10/12/2023: No evidence of residual or recurrent malignancy, unchanged fluid collection CT chest 10/12/2023: No evidence of metastatic disease CT abdomen/pelvis 10/12/2023: No evidence of metastatic disease, 1.5 cm low-attenuation lesion in the pancreas tail-cystic neoplasm not excluded 6.  Anemia with Hemoccult positive stool 09/24/2023 Upper endoscopy 10/02/2023: Duodenal ulcer with no stigmata of bleeding, gastric biopsy negative for H. pylori        Disposition: Joseph Werner is in clinical remission from the liposarcoma.  He is now 3 years out from treatment.  He will undergo surveillance imaging in February 2026.  Anemia has improved compared to earlier this year.  He is now maintained off of iron therapy.  He will call for symptoms of anemia. He will return for an office and lab visit in 4 months.    Coni Deep, MD  01/22/2024  10:34 AM

## 2024-01-22 NOTE — Telephone Encounter (Signed)
 Patient has been scheduled for follow-up visit per 01/22/24 LOS.  Pt aware of scheduled appt details.

## 2024-03-18 ENCOUNTER — Ambulatory Visit: Attending: Cardiology | Admitting: Cardiology

## 2024-03-18 ENCOUNTER — Encounter: Payer: Self-pay | Admitting: Cardiology

## 2024-03-18 VITALS — BP 110/74 | HR 48 | Ht 72.0 in | Wt 161.2 lb

## 2024-03-18 DIAGNOSIS — R001 Bradycardia, unspecified: Secondary | ICD-10-CM | POA: Diagnosis not present

## 2024-03-18 DIAGNOSIS — Z951 Presence of aortocoronary bypass graft: Secondary | ICD-10-CM

## 2024-03-18 DIAGNOSIS — I10 Essential (primary) hypertension: Secondary | ICD-10-CM

## 2024-03-18 DIAGNOSIS — I2510 Atherosclerotic heart disease of native coronary artery without angina pectoris: Secondary | ICD-10-CM | POA: Diagnosis not present

## 2024-03-18 DIAGNOSIS — C4922 Malignant neoplasm of connective and soft tissue of left lower limb, including hip: Secondary | ICD-10-CM

## 2024-03-18 NOTE — Progress Notes (Signed)
 Cardiology Office Note:  .   Date:  03/18/2024  ID:  Quintin GORMAN Dotter, DOB Sep 12, 1934, MRN [id number] PCP: Marvene Prentice SAUNDERS, FNP  [facility] Providers Cardiologist:  Oneil Parchment, MD    History of Present Illness: .   AKASH WINSKI is a 89 y.o. male Discussed the use of AI scribe software for clinical note transcription with the patient, who gave verbal consent to proceed.  History of Present Illness Stoy Fenn is an 89 year old male with coronary artery disease who presents for a follow-up visit.  He underwent coronary artery bypass grafting (CABG) in 1999 after a stress test. He has a Lima graft and experiences sinus bradycardia with a heart rate of 48 but remains asymptomatic with no syncope. He is not on beta blockers due to bradycardia and is currently on Golderectin medical therapy and takes aspirin 81 mg daily.  In 2022, he had a liposarcoma resected from his left posterior thigh. The tumor was over six inches in size. He underwent radiation therapy post-resection and continues to be followed by oncology.  He is on high-intensity statin therapy with atorvastatin  40 mg, resulting in an LDL of 52. His hemoglobin is 12.1 and creatinine is 0.9. He also takes Synthroid 125 mcg for thyroid  management.  He wears compression stockings to manage leg swelling. He remains active, going to the gym five days a week at 5 AM.  No chest pain, shortness of breath, or syncope. He feels well overall.      Studies Reviewed: SABRA   EKG Interpretation Date/Time:  Tuesday March 18 2024 08:34:48 EDT Ventricular Rate:  48 PR Interval:  166 QRS Duration:  88 QT Interval:  478 QTC Calculation: 427 R Axis:   56  Text Interpretation: Sinus bradycardia No previous ECGs available Confirmed by Parchment Oneil ([id number]) on 03/18/2024 8:47:06 AM    Results LABS LDL: 52 Hb: 12.1 Cr: 0.9  DIAGNOSTIC EKG: Normal  PATHOLOGY Liposarcoma resection, left posterior thigh, over six inches  (2022) Risk Assessment/Calculations:            Physical Exam:   VS:  BP 110/74   Pulse (!) 48   Ht 6' (1.829 m)   Wt 161 lb 3.2 oz (73.1 kg)   SpO2 97%   BMI 21.86 kg/m    Wt Readings from Last 3 Encounters:  03/18/24 161 lb 3.2 oz (73.1 kg)  01/22/24 162 lb 8 oz (73.7 kg)  10/25/23 167 lb 6.4 oz (75.9 kg)    GEN: Well nourished, well developed in no acute distress NECK: No JVD; No carotid bruits CARDIAC: RRR, no murmurs, no rubs, no gallops RESPIRATORY:  Clear to auscultation without rales, wheezing or rhonchi  ABDOMEN: Soft, non-tender, non-distended EXTREMITIES:  No edema; No deformity   ASSESSMENT AND PLAN: .    Assessment and Plan Assessment & Plan Bradycardia Chronic bradycardia with a heart rate of 48 bpm, asymptomatic. No syncope or need for pacemaker at this time. Not on beta blockers due to bradycardia. - No initiation of beta blockers.  S/P CABG Status post coronary artery bypass grafting (CABG) in 1999 following a stress test. Currently asymptomatic with no chest pain or shortness of breath. On aspirin 81 mg daily as part of medical therapy. - Continue aspirin 81 mg daily. - Annual follow-up with PA unless symptoms develop.  Hyperlipidemia Hyperlipidemia well-controlled with high-intensity statin therapy. LDL recently measured at 52 mg/dL, indicating excellent control. - Continue atorvastatin  40 mg daily.  Liposarcoma  of left thigh Liposarcoma resected in 2022 from the left posterior thigh, measuring over six inches. Underwent radiation therapy.         Dispo: 1 yr APP  Signed, Oneil Parchment, MD

## 2024-03-18 NOTE — Patient Instructions (Signed)
 Medication Instructions:  The current medical regimen is effective;  continue present plan and medications.  *If you need a refill on your cardiac medications before your next appointment, please call your pharmacy*  Follow-Up: At [facility], you and your health needs are our priority.  As part of our continuing mission to provide you with exceptional heart care, our providers are all part of one team.  This team includes your primary Cardiologist (physician) and Advanced Practice Providers or APPs (Physician Assistants and Nurse Practitioners) who all work together to provide you with the care you need, when you need it.  Your next appointment:   1 year(s)  Provider:   One of our Advanced Practice Providers (APPs): Melita Springer, PA-C  Friddie Jetty, NP Evaline Hill, NP  Theotis Flake, PA-C Lawana Pray, NP  Willis Harter, PA-C Lovette Rud, PA-C  [place], PA-C Ernest Dick, NP  Marlana Silvan, NP Marcie Sever, PA-C  Laquita Plant, PA-C    Dayna Dunn, PA-C  Scott Weaver, PA-C Palmer Bobo, NP Katlyn West, NP Callie Goodrich, PA-C  Evan Williams, PA-C Sheng Haley, PA-C  Xika Zhao, NP Kathleen Johnson, PA-C   Then, Dorothye Gathers, MD will plan to see you again in 2 year(s).    We recommend signing up for the patient portal called "MyChart".  Sign up information is provided on this After Visit Summary.  MyChart is used to connect with patients for Virtual Visits (Telemedicine).  Patients are able to view lab/test results, encounter notes, upcoming appointments, etc.  Non-urgent messages can be sent to your provider as well.   To learn more about what you can do with MyChart, go to [email or web address].

## 2024-04-16 ENCOUNTER — Other Ambulatory Visit (HOSPITAL_COMMUNITY): Payer: Self-pay | Admitting: Otolaryngology

## 2024-04-16 DIAGNOSIS — C069 Malignant neoplasm of mouth, unspecified: Secondary | ICD-10-CM

## 2024-04-18 NOTE — Progress Notes (Signed)
 Will order a panorax.

## 2024-04-19 ENCOUNTER — Ambulatory Visit (HOSPITAL_BASED_OUTPATIENT_CLINIC_OR_DEPARTMENT_OTHER)
Admission: RE | Admit: 2024-04-19 | Discharge: 2024-04-19 | Disposition: A | Discharge status: Home / Self Care | Source: Ambulatory Visit | Attending: Otolaryngology | Admitting: Otolaryngology

## 2024-04-19 DIAGNOSIS — C069 Malignant neoplasm of mouth, unspecified: Secondary | ICD-10-CM | POA: Insufficient documentation

## 2024-04-30 ENCOUNTER — Encounter (HOSPITAL_COMMUNITY): Payer: Self-pay | Admitting: Otolaryngology

## 2024-04-30 ENCOUNTER — Other Ambulatory Visit: Payer: Self-pay

## 2024-04-30 NOTE — H&P (Signed)
 HPI:   Joseph Werner is a 89 y.o. male who presents as a consult Patient.   Referring Provider: No ref. provider found  Chief complaint: Oral lesion.  HPI: Several month history of a ulcerative lesion of the anterior left mandibular alveolar region. He has had 3 teeth loosened and then pulled. He had a biopsy which was not conclusive. It was sent off to a distant place for reading. He has had panoramic x-rays but I do not have access to those yet. He is not a smoker and has never used any tobacco. Otherwise in good health. 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 He is followed by medical oncology.  PMH/Meds/All/SocHx/FamHx/ROS:   Medical History[1]  Surgical History[2]  No family history of bleeding disorders, wound healing problems or difficulty with anesthesia.     Current Medications[3]  A complete ROS was performed with pertinent positives/negatives noted in the HPI. The remainder of the ROS are negative.   Physical Exam:   BP (!) 127/58 (BP Location: Left arm, Patient Position: Sitting)  Pulse 52  Temp 97.5 F (36.4 C) (Temporal)  Ht 1.829 m (6')  Wt 72.1 kg (159 lb)  BMI 21.56 kg/m   General: Healthy and alert, in no distress, breathing easily. Normal affect. In a pleasant mood. Head: Normocephalic, atraumatic. No masses, or scars. Eyes: Pupils are equal, and reactive to light. Vision is grossly intact. No spontaneous or gaze nystagmus. Ears: Ear canals are clear. Tympanic membranes are intact, with normal landmarks and the middle ears are clear and healthy. Hearing: Grossly normal. Nose: Nasal cavities are clear with healthy mucosa, no polyps or exudate. Airways are patent. Face: No masses or scars, facial nerve function is symmetric. Oral Cavity: 2.5 cm ulcerative lesion involving the left anterior mandibular alveolar mucosa. The adjacent teeth are stable. There is no palpable connection to the floor of mouth soft tissue or to the skin. There is no hypnesthesia  of the mental nerve. No other mucosal abnormalities are noted. Tongue with normal mobility. Dentition with extensive restorative work. Oropharynx: There are no mucosal masses identified. Tongue base appears normal and healthy. Larynx/Hypopharynx: deferred Chest: Deferred Neck: No palpable masses, no cervical adenopathy, no thyroid  nodules or enlargement. Neuro: Cranial nerves II-XII with normal function. Balance: Normal gate. Other findings: none.  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 I will need to have the pathology reviewed locally. I will need to obtain the Panorex to review. I would also like to get a CT scan to further evaluate the mandible. If there is adequate mandible remaining then a simple alveolar ectomy would suffice but he would end up missing 1 tooth on either side. Fortunately there is still 1 remaining premolar on the left.

## 2024-04-30 NOTE — Progress Notes (Signed)
 SDW CALL  Patient was given pre-op instructions over the phone. The opportunity was given for the patient to ask questions. No further questions asked. Patient verbalized understanding of instructions given.   PCP - Marvene Prentice SAUNDERS, FNP  Cardiologist - Jeffrie Oneil BROCKS, MD   PPM/ICD - denies Device Orders - n/a Rep Notified - n/a  Chest x-ray - Chest Ct 10-12-23 EKG - 03-18-24 Stress Test - 1999 ECHO - n/a Cardiac Cath - n/a  Sleep Study - denies CPAP -n/a   DM -denies  Blood Thinner Instructions:denies Aspirin Instructions: instructed patient to reach out to surgeon for further instructions  ERAS Protcol - NPO   COVID TEST- n/a   Anesthesia review: Hx of bradycardia, CAD, CABG  Patient denies shortness of breath, fever, cough and chest pain over the phone call   All instructions explained to the patient, with a verbal understanding of the material. Patient agrees to go over the instructions while at home for a better understanding.

## 2024-05-02 ENCOUNTER — Inpatient Hospital Stay (HOSPITAL_COMMUNITY): Admitting: Certified Registered Nurse Anesthetist

## 2024-05-02 ENCOUNTER — Other Ambulatory Visit: Payer: Self-pay

## 2024-05-02 ENCOUNTER — Inpatient Hospital Stay (HOSPITAL_COMMUNITY)
Admission: RE | Admit: 2024-05-02 | Discharge: 2024-05-02 | DRG: 142 | Disposition: A | Discharge status: Home / Self Care | Attending: Otolaryngology | Admitting: Otolaryngology

## 2024-05-02 ENCOUNTER — Encounter (HOSPITAL_COMMUNITY)
Admission: RE | Disposition: A | Discharge status: Home / Self Care | Payer: Self-pay | Source: Home / Self Care | Attending: Otolaryngology

## 2024-05-02 ENCOUNTER — Encounter (HOSPITAL_COMMUNITY): Payer: Self-pay | Admitting: Otolaryngology

## 2024-05-02 DIAGNOSIS — I2510 Atherosclerotic heart disease of native coronary artery without angina pectoris: Secondary | ICD-10-CM | POA: Diagnosis present

## 2024-05-02 DIAGNOSIS — C031 Malignant neoplasm of lower gum: Secondary | ICD-10-CM | POA: Diagnosis present

## 2024-05-02 DIAGNOSIS — Z79899 Other long term (current) drug therapy: Secondary | ICD-10-CM | POA: Diagnosis not present

## 2024-05-02 DIAGNOSIS — K007 Teething syndrome: Secondary | ICD-10-CM | POA: Diagnosis present

## 2024-05-02 DIAGNOSIS — E039 Hypothyroidism, unspecified: Secondary | ICD-10-CM | POA: Diagnosis present

## 2024-05-02 DIAGNOSIS — E785 Hyperlipidemia, unspecified: Secondary | ICD-10-CM | POA: Diagnosis present

## 2024-05-02 DIAGNOSIS — Z79890 Hormone replacement therapy (postmenopausal): Secondary | ICD-10-CM | POA: Diagnosis not present

## 2024-05-02 DIAGNOSIS — Z7982 Long term (current) use of aspirin: Secondary | ICD-10-CM | POA: Diagnosis not present

## 2024-05-02 DIAGNOSIS — C4920 Malignant neoplasm of connective and soft tissue of unspecified lower limb, including hip: Secondary | ICD-10-CM | POA: Diagnosis present

## 2024-05-02 DIAGNOSIS — D490 Neoplasm of unspecified behavior of digestive system: Secondary | ICD-10-CM | POA: Diagnosis present

## 2024-05-02 DIAGNOSIS — C069 Malignant neoplasm of mouth, unspecified: Principal | ICD-10-CM | POA: Diagnosis present

## 2024-05-02 DIAGNOSIS — K089 Disorder of teeth and supporting structures, unspecified: Secondary | ICD-10-CM | POA: Diagnosis not present

## 2024-05-02 HISTORY — PX: MULTIPLE EXTRACTIONS WITH ALVEOLOPLASTY: SHX5342

## 2024-05-02 LAB — BASIC METABOLIC PANEL WITH GFR
Anion gap: 10 (ref 5–15)
BUN: 22 mg/dL (ref 8–23)
CO2: 22 mmol/L (ref 22–32)
Calcium: 9.3 mg/dL (ref 8.9–10.3)
Chloride: 108 mmol/L (ref 98–111)
Creatinine, Ser: 1.01 mg/dL (ref 0.61–1.24)
GFR, Estimated: >60 mL/min (ref >60)
Glucose, Bld: 90 mg/dL (ref 70–99)
Potassium: 4.2 mmol/L (ref 3.5–5.1)
Sodium: 140 mmol/L (ref 135–145)

## 2024-05-02 LAB — CBC
HCT: 34.9 % — ABNORMAL LOW (ref 39.0–52.0)
Hemoglobin: 11.7 g/dL — ABNORMAL LOW (ref 13.0–17.0)
MCH: 35.5 pg — ABNORMAL HIGH (ref 26.0–34.0)
MCHC: 33.5 g/dL (ref 30.0–36.0)
MCV: 105.8 fL — ABNORMAL HIGH (ref 80.0–100.0)
Platelets: 158 K/uL (ref 150–400)
RBC: 3.3 MIL/uL — ABNORMAL LOW (ref 4.22–5.81)
RDW: 11.6 % (ref 11.5–15.5)
WBC: 4.2 K/uL (ref 4.0–10.5)
nRBC: 0 % (ref 0.0–0.2)

## 2024-05-02 SURGERY — MULTIPLE EXTRACTION WITH ALVEOLOPLASTY
Anesthesia: General | Site: Mouth

## 2024-05-02 MED ORDER — PHENYLEPHRINE HCL-NACL 20-0.9 MG/250ML-% IV SOLN
INTRAVENOUS | Status: DC | PRN
  Administered 2024-05-02: 30 ug/min via INTRAVENOUS

## 2024-05-02 MED ORDER — DEXAMETHASONE SODIUM PHOSPHATE 10 MG/ML IJ SOLN
INTRAMUSCULAR | Status: AC
  Filled 2024-05-02: qty 2

## 2024-05-02 MED ORDER — ONDANSETRON HCL 4 MG/2ML IJ SOLN
INTRAMUSCULAR | Status: AC
  Filled 2024-05-02: qty 4

## 2024-05-02 MED ORDER — CLINDAMYCIN PHOSPHATE 900 MG/50ML IV SOLN
900.0000 mg | Freq: Once | INTRAVENOUS | Status: AC
  Administered 2024-05-02: 900 mg via INTRAVENOUS
  Filled 2024-05-02: qty 50

## 2024-05-02 MED ORDER — PROPOFOL 10 MG/ML IV BOLUS
INTRAVENOUS | Status: AC
  Filled 2024-05-02: qty 20

## 2024-05-02 MED ORDER — FENTANYL CITRATE (PF) 250 MCG/5ML IJ SOLN
INTRAMUSCULAR | Status: AC
  Filled 2024-05-02: qty 5

## 2024-05-02 MED ORDER — CHLORHEXIDINE GLUCONATE 0.12 % MT SOLN
15.0000 mL | Freq: Once | OROMUCOSAL | Status: AC
  Administered 2024-05-02: 15 mL via OROMUCOSAL
  Filled 2024-05-02: qty 15

## 2024-05-02 MED ORDER — ONDANSETRON HCL 4 MG/2ML IJ SOLN
INTRAMUSCULAR | Status: DC | PRN
  Administered 2024-05-02: 4 mg via INTRAVENOUS

## 2024-05-02 MED ORDER — ONDANSETRON HCL 4 MG/2ML IJ SOLN: 4.0000 mg | Freq: Once | INTRAMUSCULAR | Status: DC | PRN

## 2024-05-02 MED ORDER — LACTATED RINGERS IV SOLN: INTRAVENOUS | Status: DC

## 2024-05-02 MED ORDER — FENTANYL CITRATE (PF) 100 MCG/2ML IJ SOLN: 25.0000 ug | INTRAMUSCULAR | Status: DC | PRN

## 2024-05-02 MED ORDER — EPHEDRINE 5 MG/ML INJ
INTRAVENOUS | Status: AC
  Filled 2024-05-02: qty 5

## 2024-05-02 MED ORDER — OXYCODONE HCL 5 MG/5ML PO SOLN: 5.0000 mg | Freq: Once | ORAL | Status: DC | PRN

## 2024-05-02 MED ORDER — 0.9 % SODIUM CHLORIDE (POUR BTL) OPTIME
TOPICAL | Status: DC | PRN
  Administered 2024-05-02: 1000 mL

## 2024-05-02 MED ORDER — ROCURONIUM BROMIDE 10 MG/ML (PF) SYRINGE
PREFILLED_SYRINGE | INTRAVENOUS | Status: DC | PRN
  Administered 2024-05-02: 50 mg via INTRAVENOUS

## 2024-05-02 MED ORDER — GLYCOPYRROLATE PF 0.2 MG/ML IJ SOSY
PREFILLED_SYRINGE | INTRAMUSCULAR | Status: DC | PRN
  Administered 2024-05-02: .1 mg via INTRAVENOUS

## 2024-05-02 MED ORDER — ACETAMINOPHEN 10 MG/ML IV SOLN: 1000.0000 mg | Freq: Once | INTRAVENOUS | Status: DC | PRN

## 2024-05-02 MED ORDER — FENTANYL CITRATE (PF) 250 MCG/5ML IJ SOLN
INTRAMUSCULAR | Status: DC | PRN
  Administered 2024-05-02: 50 ug via INTRAVENOUS

## 2024-05-02 MED ORDER — EPHEDRINE SULFATE-NACL 50-0.9 MG/10ML-% IV SOSY
PREFILLED_SYRINGE | INTRAVENOUS | Status: DC | PRN
  Administered 2024-05-02: 5 mg via INTRAVENOUS

## 2024-05-02 MED ORDER — PROPOFOL 10 MG/ML IV BOLUS
INTRAVENOUS | Status: DC | PRN
  Administered 2024-05-02: 90 mg via INTRAVENOUS

## 2024-05-02 MED ORDER — LIDOCAINE 2% (20 MG/ML) 5 ML SYRINGE
INTRAMUSCULAR | Status: DC | PRN
  Administered 2024-05-02: 60 mg via INTRAVENOUS

## 2024-05-02 MED ORDER — SUGAMMADEX SODIUM 200 MG/2ML IV SOLN
INTRAVENOUS | Status: DC | PRN
  Administered 2024-05-02: 150 mg via INTRAVENOUS

## 2024-05-02 MED ORDER — GLYCOPYRROLATE PF 0.2 MG/ML IJ SOSY
PREFILLED_SYRINGE | INTRAMUSCULAR | Status: AC
  Filled 2024-05-02: qty 2

## 2024-05-02 MED ORDER — PHENYLEPHRINE 80 MCG/ML (10ML) SYRINGE FOR IV PUSH (FOR BLOOD PRESSURE SUPPORT)
PREFILLED_SYRINGE | INTRAVENOUS | Status: AC
  Filled 2024-05-02: qty 10

## 2024-05-02 MED ORDER — DEXAMETHASONE SODIUM PHOSPHATE 10 MG/ML IJ SOLN
INTRAMUSCULAR | Status: DC | PRN
  Administered 2024-05-02: 5 mg via INTRAVENOUS

## 2024-05-02 MED ORDER — ROCURONIUM BROMIDE 10 MG/ML (PF) SYRINGE
PREFILLED_SYRINGE | INTRAVENOUS | Status: AC
  Filled 2024-05-02: qty 20

## 2024-05-02 MED ORDER — LIDOCAINE 2% (20 MG/ML) 5 ML SYRINGE
INTRAMUSCULAR | Status: AC
  Filled 2024-05-02: qty 10

## 2024-05-02 MED ORDER — CLINDAMYCIN HCL 300 MG PO CAPS: 300.0000 mg | ORAL_CAPSULE | Freq: Three times a day (TID) | ORAL | 0 refills | Status: DC

## 2024-05-02 MED ORDER — ORAL CARE MOUTH RINSE: 15.0000 mL | Freq: Once | OROMUCOSAL | Status: AC

## 2024-05-02 MED ORDER — OXYCODONE HCL 5 MG PO TABS: 5.0000 mg | ORAL_TABLET | Freq: Once | ORAL | Status: DC | PRN

## 2024-05-02 SURGICAL SUPPLY — 26 items
BLADE SURG 15 STRL LF DISP TIS (BLADE) ×2 IMPLANT
BUR ROUND PRECISION 4.0 (BURR) ×2 IMPLANT
CANISTER SUCTION 3000ML PPV (SUCTIONS) ×3 IMPLANT
CLEANER TIP ELECTROSURG 2X2 (MISCELLANEOUS) ×3 IMPLANT
CNTNR URN SCR LID CUP LEK RST (MISCELLANEOUS) ×5 IMPLANT
CORD BIPOLAR FORCEPS 12FT (ELECTRODE) ×3 IMPLANT
COVER SURGICAL LIGHT HANDLE (MISCELLANEOUS) ×3 IMPLANT
DRAPE SURG ORHT 6 SPLT 77X108 (DRAPES) ×2 IMPLANT
ELECT COATED BLADE 2.86 ST (ELECTRODE) ×3 IMPLANT
ELECTRODE REM PT RTRN 9FT ADLT (ELECTROSURGICAL) ×3 IMPLANT
FORCEPS BIPOLAR SPETZLER 8 1.0 (NEUROSURGERY SUPPLIES) ×3 IMPLANT
GLOVE ECLIPSE 7.5 STRL STRAW (GLOVE) ×3 IMPLANT
GOWN STRL REUS W/ TWL LRG LVL3 (GOWN DISPOSABLE) ×10 IMPLANT
KIT BASIN OR (CUSTOM PROCEDURE TRAY) ×3 IMPLANT
KIT TURNOVER KIT B (KITS) ×3 IMPLANT
NS IRRIG 1000ML POUR BTL (IV SOLUTION) ×3 IMPLANT
PAD ARMBOARD POSITIONER FOAM (MISCELLANEOUS) ×6 IMPLANT
PENCIL FOOT CONTROL (ELECTRODE) ×3 IMPLANT
POSITIONER HEAD DONUT 9IN (MISCELLANEOUS) ×3 IMPLANT
SUT BONE WAX W31G (SUTURE) ×2 IMPLANT
SUT CHROMIC 3 0 PS 2 (SUTURE) ×5 IMPLANT
SUT SILK 2 0 PERMA HAND 18 BK (SUTURE) ×2 IMPLANT
SUT VIC AB 3-0 SH 27XBRD (SUTURE) ×6 IMPLANT
TOWEL GREEN STERILE FF (TOWEL DISPOSABLE) ×3 IMPLANT
TRAY ENT MC OR (CUSTOM PROCEDURE TRAY) ×3 IMPLANT
WATER STERILE IRR 1000ML POUR (IV SOLUTION) ×3 IMPLANT

## 2024-05-02 NOTE — Anesthesia Postprocedure Evaluation (Signed)
 Anesthesia Post Note  Patient: DEZMON CONOVER  Procedure(s) Performed: MULTIPLE EXTRACTION WITH MANDIBULAR ALVEOLOPLASTY (Mouth)     Patient location during evaluation: PACU Anesthesia Type: General Level of consciousness: awake and alert Pain management: pain level controlled Vital Signs Assessment: post-procedure vital signs reviewed and stable Respiratory status: spontaneous breathing, nonlabored ventilation, respiratory function stable and patient connected to nasal cannula oxygen Cardiovascular status: blood pressure returned to baseline and stable Postop Assessment: no apparent nausea or vomiting Anesthetic complications: no   No notable events documented.  Last Vitals:  Vitals:   05/02/24 1315 05/02/24 1330  BP: 133/66 133/67  Pulse: 65 66  Resp: 17 (!) 21  Temp:    SpO2: 96% 95%    Last Pain:  Vitals:   05/02/24 1330  TempSrc:   PainSc: 0-No pain                 Rome Ade

## 2024-05-02 NOTE — Interval H&P Note (Signed)
 History and Physical Interval Note:  05/02/2024 10:56 AM  Joseph Werner Dotter  has presented today for surgery, with the diagnosis of Oral cancer.  The various methods of treatment have been discussed with the patient and family. After consideration of risks, benefits and other options for treatment, the patient has consented to  Procedure(s) with comments: GLOSSECTOMY, PARTIAL (N/A) - COMPOSITE RESECTION OF FLOOR AND MANDIBLE REMOVAL, CALCULUS, SALIVARY DUCT (Bilateral) - PRIMARY CLOSURE AND BILATERAL SIALODOCHOPLASTY as a surgical intervention.  The patient's history has been reviewed, patient examined, no change in status, stable for surgery.  I have reviewed the patient's chart and labs.  Questions were answered to the patient's satisfaction.     [name]

## 2024-05-02 NOTE — Anesthesia Preprocedure Evaluation (Addendum)
 Anesthesia Evaluation  Patient identified by MRN, date of birth, ID band Patient awake    Reviewed: Allergy & Precautions, NPO status , Patient's Chart, lab work & pertinent test results  History of Anesthesia Complications Negative for: history of anesthetic complications  Airway Mallampati: II  TM Distance: >3 FB Neck ROM: Full    Dental  (+) Poor Dentition   Pulmonary neg pulmonary ROS, neg sleep apnea, neg COPD, Patient abstained from smoking.Not current smoker   Pulmonary exam normal breath sounds clear to auscultation       Cardiovascular Exercise Tolerance: Good METS(-) hypertension+ CAD  (-) Past MI (-) dysrhythmias  Rhythm:Regular Rate:Normal - Systolic murmurs CABG in the 90s?   Neuro/Psych negative neurological ROS  negative psych ROS   GI/Hepatic ,neg GERD  ,,(+)     (-) substance abuse    Endo/Other  neg diabetesHypothyroidism    Renal/GU negative Renal ROS     Musculoskeletal   Abdominal   Peds  Hematology   Anesthesia Other Findings Past Medical History: No date: Anemia     Comment:  macrocytic anemia No date: Arthritis No date: Bradycardia     Comment:  chronic No date: CAD (coronary artery disease) No date: Dyslipidemia No date: Eczema No date: Hyperlipidemia No date: Hypothyroid No date: Loss of weight No date: Macrocytosis     Comment:  with normal bone marrow biopsy in 2006 may be benign               varient, evaluated in the pastby hematologistDr. Cloretta No date: Osteopenia     Comment:  on bone density test from orthopedist Dr. Bonner, bone               density test in July 2010 showed T score-1.1 in the               lumbar spine and 2.1 in the femur. Bone density in August              2012 showed T score 2.3 in the femur and 2.2 in the               forearm No date: Postsurgical aortocoronary bypass status No date: Prostate cancer (HCC)     Comment:  H/O No date:  Prostate cancer [facility])     Comment:  followed by urology Dr. Matilda  Reproductive/Obstetrics                              Anesthesia Physical Anesthesia Plan  ASA: 2  Anesthesia Plan: General   Post-op Pain Management: Ofirmev  IV (intra-op)*   Induction: Intravenous  PONV Risk Score and Plan: 3 and Ondansetron , Dexamethasone  and Treatment may vary due to age or medical condition  Airway Management Planned: Oral ETT  Additional Equipment: None  Intra-op Plan:   Post-operative Plan: Extubation in OR  Informed Consent: I have reviewed the patients History and Physical, chart, labs and discussed the procedure including the risks, benefits and alternatives for the proposed anesthesia with the patient or authorized representative who has indicated his/her understanding and acceptance.     Dental advisory given  Plan Discussed with: CRNA and Surgeon  Anesthesia Plan Comments: (Discussed risks of anesthesia with patient, including PONV, sore throat, lip/dental/eye damage. Rare risks discussed as well, such as cardiorespiratory and neurological sequelae, and allergic reactions. Discussed the role of CRNA in patient's perioperative care. Patient understands.)  Anesthesia Quick Evaluation

## 2024-05-02 NOTE — Op Note (Signed)
 OPERATIVE REPORT  DATE OF SURGERY: 05/02/2024  PATIENT:  Joseph Werner,  89 y.o. male  PRE-OPERATIVE DIAGNOSIS: Mandibular alveolar lesion  POST-OPERATIVE DIAGNOSIS: Mandibular alveolar lesion  PROCEDURE:  Procedure(s): Extraction of teeth numbers 25 and 21 WITH MANDIBULAR alveolectomy and primary closure  SURGEON:  Ida VEAR Loader, MD  ASSISTANTS: RNFA  ANESTHESIA:   General   EBL: 60 ml  DRAINS: None  LOCAL MEDICATIONS USED:  None  SPECIMEN: 1.  Biopsy of mandibular alveolar mass, frozen section analysis, consistent with verrucas lesion but not diagnostic for verrucous carcinoma. 2.  Mandibular alveolar ectomy, suture marks left side.  COUNTS:  Correct  PROCEDURE DETAILS: The patient was taken to the operating room and placed on the operating table in the supine position. Following induction of general endotracheal anesthesia, patient was draped in a standard fashion.  1.  Biopsy of oral mandibular lesion.  A 6 mm Derm punch was used to take a biopsy through the center of the lesion.  This was sent for pathologic evaluation for frozen section and the results were as described above.  The entire lesion measured approximately 2 cm in lateral dimension and extended from the anterior alveolus to the posterior alveolus but did not extend into the floor of mouth or into the gingival labial sulcus.  2.  Mandibular alveolar ectomy.  Electrocautery was used to incise mucosa around the lesion.  Teeth numbers 25 and 21 were extracted easily with extraction forceps.  This was to provide adequate margins.  Soft tissue was dissected down to the mandible.  Mandibular cuts were created using a sidecutting bur.  Adequate mandibular rim was preserved to preserve continuity of the mandible.  The lesion was removed en bloc and sent for pathologic evaluation with appropriate marking.  Combination of bipolar and electrocautery were used for hemostasis.  The bone was smoothed with a round cutting bur.   Bone wax was used for hemostasis of the mandible.  The defect was reapproximated using interrupted 3-0 Vicryl.  The floor of mouth was brought forward and the labial sulcus was brought posteriorly and adequate closure was accomplished.  Patient was then awakened extubated and transferred to recovery in stable condition.    PATIENT DISPOSITION:  To PACU, stable

## 2024-05-02 NOTE — Anesthesia Procedure Notes (Signed)
 Procedure Name: Intubation Date/Time: 05/02/2024 11:45 AM  Performed by: Lamar Lucie DASEN, CRNAPre-anesthesia Checklist: Patient identified, Emergency Drugs available, Suction available and Patient being monitored Patient Re-evaluated:Patient Re-evaluated prior to induction Oxygen Delivery Method: Circle system utilized Preoxygenation: Pre-oxygenation with 100% oxygen Induction Type: IV induction Ventilation: Mask ventilation without difficulty Laryngoscope Size: Mac and 4 Grade View: Grade I Tube type: Oral Tube size: 7.5 mm Number of attempts: 1 Airway Equipment and Method: Stylet and Oral airway Placement Confirmation: ETT inserted through vocal cords under direct vision, positive ETCO2 and breath sounds checked- equal and bilateral Secured at: 22 cm Tube secured with: Tape Dental Injury: Teeth and Oropharynx as per pre-operative assessment

## 2024-05-02 NOTE — Discharge Instructions (Signed)
 Rinse mouth with salt water 3 times daily.  Resume diet as tolerated, start with liquids and soft foods first and advance to regular foods as you feel you are able to.  Do not wear partial denture yet.

## 2024-05-02 NOTE — Transfer of Care (Signed)
 Immediate Anesthesia Transfer of Care Note  Patient: Joseph Werner  Procedure(s) Performed: MULTIPLE EXTRACTION WITH MANDIBULAR ALVEOLOPLASTY (Mouth)  Patient Location: PACU  Anesthesia Type:General  Level of Consciousness: drowsy  Airway & Oxygen Therapy: Patient Spontanous Breathing  Post-op Assessment: Report given to RN, Post -op Vital signs reviewed and stable, and Patient moving all extremities X 4  Post vital signs: Reviewed and stable  Last Vitals:  Vitals Value Taken Time  BP 133/66 05/02/24 13:15  Temp 36.4 C 05/02/24 13:10  Pulse 67 05/02/24 13:19  Resp 16 05/02/24 13:19  SpO2 95 % 05/02/24 13:19  Vitals shown include unfiled device data.  Last Pain:  Vitals:   05/02/24 1007  TempSrc:   PainSc: 0-No pain         Complications: No notable events documented.

## 2024-05-05 ENCOUNTER — Encounter (HOSPITAL_COMMUNITY): Payer: Self-pay | Admitting: Otolaryngology

## 2024-05-05 LAB — SURGICAL PATHOLOGY

## 2024-05-05 NOTE — Discharge Summary (Signed)
 Physician Discharge Summary  Patient ID: Joseph Werner MRN: [id number] DOB/AGE: July 30, 1935 89 y.o.  Admit date: 05/02/2024 Discharge date: 05/05/2024  Admission Diagnoses:oral lesion  Discharge Diagnoses:  Principal Problem:   Tumor of oral cavity   Discharged Condition: good  Hospital Course: no complications  Consults: none  Significant Diagnostic Studies: none  Treatments: surgery: mandibular alveolar mass resection  Discharge Exam: Blood pressure (!) 147/71, pulse (!) 56, temperature 97.7 F (36.5 C), resp. rate 15, height 6' (1.829 m), weight 71.2 kg, SpO2 95%. PHYSICAL EXAM: Awake and alert, breathing well, taking po liquids.  Disposition: Discharge disposition: 01-Home or Self Care       Discharge Instructions     Diet - low sodium heart healthy   Complete by: As directed    Discharge wound care:   Complete by: As directed    Rinse mouth with salt water 3 times daily.   Increase activity slowly   Complete by: As directed    Increase activity slowly   Complete by: As directed       Allergies as of 05/02/2024   No Known Allergies      Medication List     TAKE these medications    aspirin EC 81 MG tablet Take 81 mg by mouth daily.   atorvastatin  40 MG tablet Commonly known as: LIPITOR TAKE 1 TABLET (40 MG TOTAL) BY MOUTH DAILY AT 6 PM.   clindamycin  300 MG capsule Commonly known as: Cleocin  Take 1 capsule (300 mg total) by mouth 3 (three) times daily.   clindamycin  300 MG capsule Commonly known as: Cleocin  Take 1 capsule (300 mg total) by mouth 3 (three) times daily.   FLAX SEED OIL PO Take 1 capsule by mouth daily.   GLUCOSAMINE-CHONDROITIN PO Take 1 tablet by mouth 2 (two) times daily.   ibuprofen 200 MG tablet Commonly known as: ADVIL Take 400 mg by mouth every 8 (eight) hours as needed for pain or moderate pain (pain score 4-6).   levothyroxine 125 MCG tablet Commonly known as: SYNTHROID Take 125 mcg by mouth daily before  breakfast.   omeprazole 20 MG capsule Commonly known as: PRILOSEC Take 20 mg by mouth daily.   Q-Sorb Co Q-10 100 MG capsule Generic drug: Coenzyme Q10 Take 100 mg by mouth daily.   Selenium 200 MCG Caps Take 200 mcg by mouth daily.   vitamin C 1000 MG tablet Take 2,000 mg by mouth daily.   vitamin D3 50 MCG (2000 UT) Caps Take 2,000 Units by mouth daily.               Discharge Care Instructions  (From admission, onward)           Start     Ordered   05/02/24 0000  Discharge wound care:       Comments: Rinse mouth with salt water 3 times daily.   05/02/24 1336            Follow-up Information     Jesus Oliphant, MD. Schedule an appointment as soon as possible for a visit in 1 week(s).   Specialty: Otolaryngology Contact information: [address] [place] [address] [phone]                 Signed: Oliphant Jesus 05/05/2024, 7:56 AM

## 2024-05-23 ENCOUNTER — Inpatient Hospital Stay

## 2024-05-23 ENCOUNTER — Inpatient Hospital Stay: Admitting: Oncology

## 2024-05-23 ENCOUNTER — Inpatient Hospital Stay: Attending: Oncology

## 2024-05-23 VITALS — BP 127/60 | HR 60 | Temp 98.1°F | Resp 18 | Ht 72.0 in | Wt 162.1 lb

## 2024-05-23 DIAGNOSIS — Z923 Personal history of irradiation: Secondary | ICD-10-CM | POA: Diagnosis not present

## 2024-05-23 DIAGNOSIS — Z23 Encounter for immunization: Secondary | ICD-10-CM | POA: Diagnosis not present

## 2024-05-23 DIAGNOSIS — D649 Anemia, unspecified: Secondary | ICD-10-CM

## 2024-05-23 DIAGNOSIS — C499 Malignant neoplasm of connective and soft tissue, unspecified: Secondary | ICD-10-CM | POA: Diagnosis not present

## 2024-05-23 LAB — CBC WITH DIFFERENTIAL (CANCER CENTER ONLY)
Abs Immature Granulocytes: 0.01 K/uL (ref 0.00–0.07)
Basophils Absolute: 0 K/uL (ref 0.0–0.1)
Basophils Relative: 1 %
Eosinophils Absolute: 0.1 K/uL (ref 0.0–0.5)
Eosinophils Relative: 4 %
HCT: 32.1 % — ABNORMAL LOW (ref 39.0–52.0)
Hemoglobin: 10.7 g/dL — ABNORMAL LOW (ref 13.0–17.0)
Immature Granulocytes: 0 %
Lymphocytes Relative: 36 %
Lymphs Abs: 1.2 K/uL (ref 0.7–4.0)
MCH: 35.5 pg — ABNORMAL HIGH (ref 26.0–34.0)
MCHC: 33.3 g/dL (ref 30.0–36.0)
MCV: 106.6 fL — ABNORMAL HIGH (ref 80.0–100.0)
Monocytes Absolute: 0.3 K/uL (ref 0.1–1.0)
Monocytes Relative: 10 %
Neutro Abs: 1.6 K/uL — ABNORMAL LOW (ref 1.7–7.7)
Neutrophils Relative %: 49 %
Platelet Count: 151 K/uL (ref 150–400)
RBC: 3.01 MIL/uL — ABNORMAL LOW (ref 4.22–5.81)
RDW: 11.8 % (ref 11.5–15.5)
WBC Count: 3.3 K/uL — ABNORMAL LOW (ref 4.0–10.5)
nRBC: 0 % (ref 0.0–0.2)

## 2024-05-23 MED ORDER — INFLUENZA VAC SPLIT HIGH-DOSE 0.5 ML IM SUSY
0.5000 mL | PREFILLED_SYRINGE | Freq: Once | INTRAMUSCULAR | Status: AC
  Administered 2024-05-23: 0.5 mL via INTRAMUSCULAR
  Filled 2024-05-23: qty 0.5

## 2024-05-23 NOTE — Progress Notes (Signed)
 [facility] OFFICE PROGRESS NOTE   Diagnosis: Anemia, liposarcoma  INTERVAL HISTORY:   Joseph Werner returns for a scheduled visit.  He generally feels well.  Good appetite.  No bleeding.  He had several teeth extracted earlier this year.  Extraction site did not heal and he was referred to oral surgery.  A biopsy 03/25/2024 revealed an atypical papillary epithelial proliferation.  Squamous cell carcinoma could not be excluded. He underwent extraction of teeth numbers 25 and 21 with a mandibular alveolectomy by Dr. Jesus 05/02/2024.  The pathology revealed a verrucous carcinoma, well-differentiated.  Objective:  Vital signs in last 24 hours:  Blood pressure 127/60, pulse 60, temperature 98.1 F (36.7 C), temperature source Temporal, resp. rate 18, height 6' (1.829 m), weight 162 lb 1.6 oz (73.5 kg), SpO2 100%.    HEENT: Surgical site at the left lower gumline with sutures in place, no visible mass Lymphatics: No cervical, supraclavicular, axillary, or inguinal nodes Resp: Lungs clear bilaterally Cardio: Regular rate and rhythm with premature beats GI: No hepatosplenomegaly Vascular: Ace edema at the lower leg bilaterall  Skin: Left thigh scar without evidence of recurrent tumor   Lab Results:  Lab Results  Component Value Date   WBC 3.3 (L) 05/23/2024   HGB 10.7 (L) 05/23/2024   HCT 32.1 (L) 05/23/2024   MCV 106.6 (H) 05/23/2024   PLT 151 05/23/2024   NEUTROABS 1.6 (L) 05/23/2024    CMP  Lab Results  Component Value Date   NA 140 05/02/2024   K 4.2 05/02/2024   CL 108 05/02/2024   CO2 22 05/02/2024   GLUCOSE 90 05/02/2024   BUN 22 05/02/2024   CREATININE 1.01 05/02/2024   CALCIUM  9.3 05/02/2024   PROT 6.9 10/01/2023   ALBUMIN 4.1 10/01/2023   AST 22 10/01/2023   ALT 22 10/01/2023   ALKPHOS 47 10/01/2023   BILITOT 0.7 10/01/2023   GFRNONAA >60 05/02/2024   GFRAA 73 (L) 11/11/2012   reviewed the patient's current  medications.   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 A bone marrow biopsy in 2006 was nondiagnostic. 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 Chronic bradycardia. History of a Borderline low white count Right inguinal hernia repair 11/18/2012 Left thigh pleomorphic liposarcoma, grade 3 with necrosis, negative margins, status post neoadjuvant radiation 01/17/2021 - 02/21/2021 (50 [facility]), followed by radical resection 04/11/2021, pT4 MRI left femur 02/14/2022-decrease in postsurgical seroma, no MRI evidence of abnormal enhancement CT chest 02/14/2022-unchanged small subpleural nodules in both lower lobes, no evidence of metastatic disease MRI femur 10/12/2023: No evidence of residual or recurrent malignancy, unchanged fluid collection CT chest 10/12/2023: No evidence of metastatic disease CT abdomen/pelvis 10/12/2023: No evidence of metastatic disease, 1.5 cm low-attenuation lesion in the pancreas tail-cystic neoplasm not excluded 6.  Anemia with Hemoccult positive stool 09/24/2023 Upper endoscopy 10/02/2023: Duodenal ulcer with no stigmata of bleeding, gastric biopsy negative for H. Pylori 7.  Mandibular alveolar mass-biopsy 03/25/2024: Atypical papillary epithelial proliferation, squamous cell carcinoma not excluded 05/02/2024 excision of mandibular alveolar mass and alveolectomy: Verrucous carcinoma, well-differentiated, keratinizing, carcinoma approaches but does not involve bone, cauterized tissue at the margin suspicious for involvement by carcinoma        Disposition: [name] is in clinical remission from the liposarcoma.  He will be due for surveillance imaging in February 2026.  [name] recently underwent excision of a verrucous carcinoma from the left mandibular alveolar region with the pathology confirming a  verrucous carcinoma.  There was a question of tumor involving the cauterized margin.  He will continue  follow-up and management of the verrucous carcinoma with ENT and oral surgery.  The hemoglobin is lower today.  This could be related to the recent surgery.  He denies bleeding.  He will return for an office visit and CBC in 6 weeks.  We will check a vitamin B12, reticulocyte count, and ferritin when he is here in 6 weeks.  Arley Hof, MD  05/23/2024  9:18 AM

## 2024-07-07 ENCOUNTER — Encounter: Payer: Self-pay | Admitting: Oncology

## 2024-07-07 ENCOUNTER — Inpatient Hospital Stay (HOSPITAL_BASED_OUTPATIENT_CLINIC_OR_DEPARTMENT_OTHER): Admitting: Oncology

## 2024-07-07 ENCOUNTER — Other Ambulatory Visit: Payer: Self-pay | Admitting: *Deleted

## 2024-07-07 ENCOUNTER — Inpatient Hospital Stay: Attending: Oncology

## 2024-07-07 VITALS — BP 124/58 | HR 68 | Temp 98.3°F | Resp 18 | Ht 72.0 in | Wt 158.5 lb

## 2024-07-07 DIAGNOSIS — C4922 Malignant neoplasm of connective and soft tissue of left lower limb, including hip: Secondary | ICD-10-CM | POA: Diagnosis not present

## 2024-07-07 DIAGNOSIS — D649 Anemia, unspecified: Secondary | ICD-10-CM | POA: Diagnosis not present

## 2024-07-07 LAB — CBC WITH DIFFERENTIAL (CANCER CENTER ONLY)
Abs Immature Granulocytes: 0.01 K/uL (ref 0.00–0.07)
Basophils Absolute: 0 K/uL (ref 0.0–0.1)
Basophils Relative: 1 %
Eosinophils Absolute: 0.1 K/uL (ref 0.0–0.5)
Eosinophils Relative: 4 %
HCT: 31.8 % — ABNORMAL LOW (ref 39.0–52.0)
Hemoglobin: 10.9 g/dL — ABNORMAL LOW (ref 13.0–17.0)
Immature Granulocytes: 0 %
Lymphocytes Relative: 22 %
Lymphs Abs: 0.8 K/uL (ref 0.7–4.0)
MCH: 36.2 pg — ABNORMAL HIGH (ref 26.0–34.0)
MCHC: 34.3 g/dL (ref 30.0–36.0)
MCV: 105.6 fL — ABNORMAL HIGH (ref 80.0–100.0)
Monocytes Absolute: 0.4 K/uL (ref 0.1–1.0)
Monocytes Relative: 11 %
Neutro Abs: 2.2 K/uL (ref 1.7–7.7)
Neutrophils Relative %: 62 %
Platelet Count: 149 K/uL — ABNORMAL LOW (ref 150–400)
RBC: 3.01 MIL/uL — ABNORMAL LOW (ref 4.22–5.81)
RDW: 11.5 % (ref 11.5–15.5)
WBC Count: 3.6 K/uL — ABNORMAL LOW (ref 4.0–10.5)
nRBC: 0 % (ref 0.0–0.2)

## 2024-07-07 LAB — RETICULOCYTES
Immature Retic Fract: 8.5 % (ref 2.3–15.9)
RBC.: 3.15 MIL/uL — ABNORMAL LOW (ref 4.22–5.81)
Retic Count, Absolute: 49.1 K/uL (ref 19.0–186.0)
Retic Ct Pct: 1.6 % (ref 0.4–3.1)

## 2024-07-07 LAB — VITAMIN B12: Vitamin B-12: 1013 pg/mL — ABNORMAL HIGH (ref 180–914)

## 2024-07-07 LAB — FERRITIN: Ferritin: 158 ng/mL (ref 24–336)

## 2024-07-07 LAB — SAVE SMEAR(SSMR), FOR PROVIDER SLIDE REVIEW

## 2024-07-07 NOTE — Progress Notes (Addendum)
  Joseph Werner OFFICE PROGRESS NOTE   Diagnosis: Anemia, liposarcoma  INTERVAL HISTORY:   Joseph Werner returns as scheduled.  He feels well.  Good appetite.  He exercises 5 days/week.  No new complaint.  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  Objective:  Vital signs in last 24 hours:  Blood pressure (!) 124/58, pulse 68, temperature 98.3 F (36.8 C), temperature source Temporal, resp. rate 18, height 6' (1.829 m), weight 158 lb 8 oz (71.9 kg), SpO2 98%.   Lymphatics: No cervical, supraclavicular, axillary, or inguinal nodes Resp: Lungs clear bilaterally Cardio: Regular rate and rhythm GI: No hepatosplenomegaly Vascular: No leg edema Musculoskeletal: Left thigh without evidence of recurrent tumor  Lab Results:  Lab Results  Component Value Date   WBC 3.6 (L) 07/07/2024   HGB 10.9 (L) 07/07/2024   HCT 31.8 (L) 07/07/2024   MCV 105.6 (H) 07/07/2024   PLT PENDING 07/07/2024   NEUTROABS 2.2 07/07/2024   Blood smear: Few ovalocytes, the polychromasia is not increased, the platelets are normal in number.  The white cell morphology is unremarkable. CMP  Lab Results  Component Value Date   NA 140 05/02/2024   K 4.2 05/02/2024   CL 108 05/02/2024   CO2 22 05/02/2024   GLUCOSE 90 05/02/2024   BUN 22 05/02/2024   CREATININE 1.01 05/02/2024   CALCIUM  9.3 05/02/2024   PROT 6.9 10/01/2023   ALBUMIN 4.1 10/01/2023   AST 22 10/01/2023   ALT 22 10/01/2023   ALKPHOS 47 10/01/2023   BILITOT 0.7 10/01/2023   GFRNONAA >60 05/02/2024   GFRAA 73 (L) 11/11/2012   Medications: I have reviewed the patient's current medications.   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 A bone marrow biopsy in 2006 was nondiagnostic. 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 Chronic bradycardia. History of a Borderline low white count Right inguinal hernia repair 11/18/2012 Left thigh  pleomorphic liposarcoma, grade 3 with necrosis, negative margins, status post neoadjuvant radiation 01/17/2021 - 02/21/2021 ([facility]), followed by radical resection 04/11/2021, pT4 MRI left femur 02/14/2022-decrease in postsurgical seroma, no MRI evidence of abnormal enhancement CT chest 02/14/2022-unchanged small subpleural nodules in both lower lobes, no evidence of metastatic disease MRI femur 10/12/2023: No evidence of residual or recurrent malignancy, unchanged fluid collection CT chest 10/12/2023: No evidence of metastatic disease CT abdomen/pelvis 10/12/2023: No evidence of metastatic disease, 1.5 cm low-attenuation lesion in the pancreas tail-cystic neoplasm not excluded 6.  Anemia with Hemoccult positive stool 09/24/2023 Upper endoscopy 10/02/2023: Duodenal ulcer with no stigmata of bleeding, gastric biopsy negative for H. Pylori 7.  Mandibular alveolar mass-biopsy 03/25/2024: Atypical papillary epithelial proliferation, squamous cell carcinoma not excluded 05/02/2024 excision of mandibular alveolar mass and alveolectomy: Verrucous carcinoma, well-differentiated, keratinizing, carcinoma approaches but does not involve bone, cauterized tissue at the margin suspicious for involvement by carcinoma         Disposition: Joseph Werner is in clinical remission from liposarcoma.  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  He has chronic red cell macrocytosis and mild anemia.  We will follow-up on B12 and ferritin levels from today.  [name] will turn for an office visit and surveillance imaging in 3 months.  Arley Hof, MD  07/07/2024  8:36 AM

## 2024-07-08 ENCOUNTER — Telehealth: Payer: Self-pay | Admitting: *Deleted

## 2024-07-08 NOTE — Telephone Encounter (Signed)
 Notified Mr. Zhen of MRI appointment on 11/21.

## 2024-07-18 ENCOUNTER — Ambulatory Visit (HOSPITAL_BASED_OUTPATIENT_CLINIC_OR_DEPARTMENT_OTHER)
Admission: RE | Admit: 2024-07-18 | Discharge: 2024-07-18 | Disposition: A | Discharge status: Home / Self Care | Source: Ambulatory Visit | Attending: Oncology | Admitting: Oncology

## 2024-07-18 DIAGNOSIS — C4922 Malignant neoplasm of connective and soft tissue of left lower limb, including hip: Secondary | ICD-10-CM | POA: Insufficient documentation

## 2024-07-18 MED ORDER — GADOBUTROL 1 MMOL/ML IV SOLN
7.0000 mL | Freq: Once | INTRAVENOUS | Status: AC | PRN
  Administered 2024-07-18: 7 mL via INTRAVENOUS
  Filled 2024-07-18: qty 7.5

## 2024-07-25 ENCOUNTER — Ambulatory Visit: Payer: Self-pay | Admitting: Oncology

## 2024-07-28 NOTE — Telephone Encounter (Signed)
 Notified Joseph Werner that MRI shows no cancer. F/U as scheduled

## 2024-09-26 ENCOUNTER — Telehealth: Payer: Self-pay | Admitting: *Deleted

## 2024-09-26 NOTE — Telephone Encounter (Signed)
 Left VM that scan scheduled for 10/07/24 at 1:45/2:00 at [facility] and to come to [facility] for lab that day at 1 pm. Also sent mychart message.

## 2024-09-29 ENCOUNTER — Inpatient Hospital Stay

## 2024-09-30 ENCOUNTER — Telehealth: Payer: Self-pay | Admitting: *Deleted

## 2024-09-30 NOTE — Telephone Encounter (Signed)
 LVM for Joseph Werner that Dr. Cloretta will see him to review CT scan on 10/13/24 at 3:30 pm.

## 2024-10-07 ENCOUNTER — Inpatient Hospital Stay: Admitting: Oncology

## 2024-10-07 ENCOUNTER — Ambulatory Visit (HOSPITAL_BASED_OUTPATIENT_CLINIC_OR_DEPARTMENT_OTHER)

## 2024-10-07 ENCOUNTER — Inpatient Hospital Stay

## 2024-10-13 ENCOUNTER — Inpatient Hospital Stay: Admitting: Oncology
